# Patient Record
Sex: Male | Born: 1945
Health system: Southern US, Community
[De-identification: ages and names within clinical notes are randomized; demographics above are authoritative.]

## PROBLEM LIST (undated history)

## (undated) DIAGNOSIS — F329 Major depressive disorder, single episode, unspecified: Secondary | ICD-10-CM

## (undated) DIAGNOSIS — N529 Male erectile dysfunction, unspecified: Secondary | ICD-10-CM

## (undated) DIAGNOSIS — I252 Old myocardial infarction: Secondary | ICD-10-CM

## (undated) DIAGNOSIS — F32A Depression, unspecified: Secondary | ICD-10-CM

## (undated) DIAGNOSIS — E785 Hyperlipidemia, unspecified: Secondary | ICD-10-CM

## (undated) DIAGNOSIS — I10 Essential (primary) hypertension: Secondary | ICD-10-CM

## (undated) DIAGNOSIS — N481 Balanitis: Secondary | ICD-10-CM

## (undated) DIAGNOSIS — C61 Malignant neoplasm of prostate: Secondary | ICD-10-CM

## (undated) DIAGNOSIS — I2510 Atherosclerotic heart disease of native coronary artery without angina pectoris: Secondary | ICD-10-CM

## (undated) DIAGNOSIS — E119 Type 2 diabetes mellitus without complications: Secondary | ICD-10-CM

## (undated) HISTORY — DX: Essential (primary) hypertension: I10

## (undated) HISTORY — DX: Hyperlipidemia, unspecified: E78.5

## (undated) HISTORY — DX: Atherosclerotic heart disease of native coronary artery without angina pectoris: I25.10

## (undated) HISTORY — DX: Male erectile dysfunction, unspecified: N52.9

## (undated) HISTORY — DX: Old myocardial infarction: I25.2

## (undated) HISTORY — PX: PROSTATE BIOPSY: SHX241

## (undated) HISTORY — DX: Depression, unspecified: F32.A

## (undated) HISTORY — DX: Type 2 diabetes mellitus without complications: E11.9

## (undated) HISTORY — PX: UMBILICAL HERNIA REPAIR: SHX196

## (undated) HISTORY — DX: Balanitis: N48.1

## (undated) HISTORY — DX: Major depressive disorder, single episode, unspecified: F32.9

---

## 2003-07-18 ENCOUNTER — Inpatient Hospital Stay (HOSPITAL_BASED_OUTPATIENT_CLINIC_OR_DEPARTMENT_OTHER): Admission: RE | Admit: 2003-07-18 | Discharge: 2003-07-18 | Payer: Self-pay | Admitting: *Deleted

## 2004-09-24 ENCOUNTER — Ambulatory Visit: Payer: Self-pay | Admitting: Cardiology

## 2004-09-25 ENCOUNTER — Ambulatory Visit: Payer: Self-pay | Admitting: Internal Medicine

## 2004-10-21 ENCOUNTER — Ambulatory Visit: Payer: Self-pay | Admitting: Cardiology

## 2005-01-06 ENCOUNTER — Ambulatory Visit: Payer: Self-pay | Admitting: Internal Medicine

## 2005-01-21 ENCOUNTER — Ambulatory Visit: Payer: Self-pay | Admitting: Internal Medicine

## 2005-02-24 ENCOUNTER — Ambulatory Visit: Payer: Self-pay | Admitting: Internal Medicine

## 2005-06-24 ENCOUNTER — Ambulatory Visit: Payer: Self-pay | Admitting: Internal Medicine

## 2005-08-19 ENCOUNTER — Ambulatory Visit: Payer: Self-pay | Admitting: Cardiology

## 2005-08-26 ENCOUNTER — Encounter: Payer: Self-pay | Admitting: Cardiovascular Disease

## 2005-08-26 ENCOUNTER — Ambulatory Visit: Payer: Self-pay | Admitting: Cardiology

## 2005-08-26 ENCOUNTER — Ambulatory Visit: Payer: Self-pay

## 2006-01-18 ENCOUNTER — Ambulatory Visit: Payer: Self-pay | Admitting: Internal Medicine

## 2006-08-19 ENCOUNTER — Ambulatory Visit: Payer: Self-pay | Admitting: Cardiology

## 2006-09-30 ENCOUNTER — Encounter: Payer: Self-pay | Admitting: Internal Medicine

## 2006-09-30 DIAGNOSIS — I498 Other specified cardiac arrhythmias: Secondary | ICD-10-CM

## 2006-09-30 DIAGNOSIS — Z9889 Other specified postprocedural states: Secondary | ICD-10-CM

## 2006-09-30 DIAGNOSIS — I10 Essential (primary) hypertension: Secondary | ICD-10-CM

## 2006-09-30 DIAGNOSIS — Z951 Presence of aortocoronary bypass graft: Secondary | ICD-10-CM | POA: Insufficient documentation

## 2007-02-17 ENCOUNTER — Encounter: Payer: Self-pay | Admitting: Internal Medicine

## 2007-10-18 ENCOUNTER — Ambulatory Visit: Payer: Self-pay | Admitting: Cardiology

## 2007-10-25 ENCOUNTER — Encounter: Payer: Self-pay | Admitting: Internal Medicine

## 2007-11-09 ENCOUNTER — Ambulatory Visit: Payer: Self-pay

## 2007-12-06 ENCOUNTER — Telehealth: Payer: Self-pay | Admitting: Internal Medicine

## 2008-02-02 ENCOUNTER — Telehealth: Payer: Self-pay | Admitting: Internal Medicine

## 2008-02-22 ENCOUNTER — Ambulatory Visit: Payer: Self-pay | Admitting: Internal Medicine

## 2008-02-22 DIAGNOSIS — N476 Balanoposthitis: Secondary | ICD-10-CM

## 2008-02-22 DIAGNOSIS — K5900 Constipation, unspecified: Secondary | ICD-10-CM | POA: Insufficient documentation

## 2008-09-07 ENCOUNTER — Encounter (INDEPENDENT_AMBULATORY_CARE_PROVIDER_SITE_OTHER): Payer: Self-pay | Admitting: *Deleted

## 2009-02-11 ENCOUNTER — Telehealth: Payer: Self-pay | Admitting: Internal Medicine

## 2009-03-11 ENCOUNTER — Ambulatory Visit: Payer: Self-pay | Admitting: Internal Medicine

## 2009-03-13 ENCOUNTER — Encounter (INDEPENDENT_AMBULATORY_CARE_PROVIDER_SITE_OTHER): Payer: Self-pay | Admitting: *Deleted

## 2009-03-27 ENCOUNTER — Telehealth: Payer: Self-pay | Admitting: Internal Medicine

## 2009-04-30 ENCOUNTER — Encounter: Payer: Self-pay | Admitting: Internal Medicine

## 2009-11-13 ENCOUNTER — Ambulatory Visit: Payer: Self-pay | Admitting: Internal Medicine

## 2009-11-15 LAB — CONVERTED CEMR LAB
Albumin: 4.4 g/dL (ref 3.5–5.2)
Alkaline Phosphatase: 72 units/L (ref 39–117)
BUN: 21 mg/dL (ref 6–23)
CO2: 27 meq/L (ref 19–32)
Calcium: 10 mg/dL (ref 8.4–10.5)
Cholesterol: 158 mg/dL (ref 0–200)
Direct LDL: 78 mg/dL
GFR calc non Af Amer: 95.04 mL/min (ref >60)
Glucose, Bld: 296 mg/dL — ABNORMAL HIGH (ref 70–99)
HDL: 43.8 mg/dL (ref >39.00)
Sodium: 136 meq/L (ref 135–145)
Total Protein: 7.3 g/dL (ref 6.0–8.3)

## 2010-01-29 ENCOUNTER — Encounter: Payer: Self-pay | Admitting: Internal Medicine

## 2010-03-11 NOTE — Assessment & Plan Note (Signed)
Summary: PER PT FU--D/T---STC   Vital Signs:  Patient profile:   65 year old male Height:      72 inches Weight:      247 pounds BMI:     33.62 Temp:     98.3 degrees F oral Pulse rate:   103 / minute Pulse rhythm:   regular BP sitting:   130 / 86  (left arm) Cuff size:   regular  Vitals Entered By: Lanier Prude, CMA(AAMA) (November 13, 2009 10:38 AM)  CC: f/u Is Patient Diabetic? Yes   CC:  f/u.  History of Present Illness: The patient presents for a follow up of hypertension, diabetes, hyperlipidemia   Current Medications (verified): 1)  Metoprolol Succinate 50 Mg  Tb24 (Metoprolol Succinate) .... Once Daily 2)  Lotrisone 1-0.05 % Crea (Clotrimazole-Betamethasone) .... Use Two Times A Day X 1 Mo 3)  Lovastatin 20 Mg Tabs (Lovastatin) .... Take 2 Tab By Mouth Daily 4)  Glimepiride 4 Mg Tabs (Glimepiride) .[name].. 1 By Mouth Qd 5)  Metformin Hcl 500 Mg Tabs (Metformin Hcl) .... Take 1 Tab Twice Daily 6)  Vitamin D3 1000 Unit  Tabs (Cholecalciferol) .[name].. 1 By Mouth Daily 7)  Fibercon 625 Mg Tabs (Calcium Polycarbophil) .... 2 By Mouth Qd 8)  Aspirin 325 Mg Tabs (Aspirin) .... Once Daily  Allergies (verified): No Known Drug Allergies  Past History:  Past Medical History: Last updated: 03/11/2009 Coronary artery disease Diabetes mellitus, type II Hypertension ED Balanitis Hyperlipidemia  Family History: Last updated: 02/22/2008 DM  Past Surgical History: Denies surgical history  Social History: Occupation: truck Hospital doctor Married Never Smoked  Review of Systems       The patient complains of weight gain.  The patient denies chest pain, dyspnea on exertion, and abdominal pain.    Physical Exam  General:  NAD overweight-appearing.   Nose:  External nasal examination shows no deformity or inflammation. Nasal mucosa are pink and moist without lesions or exudates. Mouth:  Oral mucosa and oropharynx without lesions or exudates.  Teeth in good repair. Lungs:   CTA Heart:  RRR Abdomen:  Bowel sounds positive,abdomen soft and non-tender without masses, organomegaly or hernias noted. Msk:  Stiff LS Extremities:  No edema Neurologic:  alert & oriented X3.   Skin:  No rash Psych:  Cognition and judgment appear intact. Alert and cooperative with normal attention span and concentration. No apparent delusions, illusions, hallucinations   Impression & Recommendations:  Problem # 1:  BALANITIS (ICD-607.1) Assessment Unchanged Refused Urol f/u  Problem # 2:  DIABETES MELLITUS, TYPE II (ICD-250.00) Assessment: Deteriorated  The following medications were removed from the medication list:    Metformin Hcl 500 Mg Tabs (Metformin hcl) .[name]... Take 1 tab twice daily    Metformin Hcl 1000 Mg Tabs (Metformin hcl) .[name]... 1 by mouth two times a day for diabetes His updated medication list for this problem includes:    Glimepiride 4 Mg Tabs (Glimepiride) .[name]... 1 by mouth qd    Aspirin 325 Mg Tabs (Aspirin) ..... Once daily    Kombiglyze Xr 2.06-998 Mg Xr24h-tab (Saxagliptin-metformin) .[name]... 1 by mouth bid  Orders: TLB-BMP (Basic Metabolic Panel-BMET) (80048-METABOL) TLB-Hepatic/Liver Function Pnl (80076-HEPATIC) TLB-Lipid Panel (80061-LIPID) TLB-A1C / Hgb A1C (Glycohemoglobin) (83036-A1C)  Problem # 3:  CORONARY ARTERY DISEASE (ICD-414.00) Assessment: Unchanged  His updated medication list for this problem includes:    Metoprolol Succinate 50 Mg Tb24 (Metoprolol succinate) ..... Once daily    Aspirin 325 Mg Tabs (Aspirin) ..... Once  daily  Orders: TLB-BMP (Basic Metabolic Panel-BMET) (80048-METABOL) TLB-Hepatic/Liver Function Pnl (80076-HEPATIC) TLB-Lipid Panel (80061-LIPID) TLB-A1C / Hgb A1C (Glycohemoglobin) (83036-A1C)  Problem # 4:  DYSLIPIDEMIA (ICD-272.4) Assessment: Unchanged  His updated medication list for this problem includes:    Lovastatin 20 Mg Tabs (Lovastatin) .[name]... Take 2 tab by mouth daily  Orders: TLB-BMP (Basic Metabolic  Panel-BMET) (80048-METABOL)  Complete Medication List: 1)  Metoprolol Succinate 50 Mg Tb24 (Metoprolol succinate) .... Once daily 2)  Lotrisone 1-0.05 % Crea (Clotrimazole-betamethasone) .... Use two times a day x 1 mo 3)  Lovastatin 20 Mg Tabs (Lovastatin) .... Take 2 tab by mouth daily 4)  Glimepiride 4 Mg Tabs (Glimepiride) .[name].. 1 by mouth qd 5)  Vitamin D3 1000 Unit Tabs (Cholecalciferol) .[name].. 1 by mouth daily 6)  Fibercon 625 Mg Tabs (Calcium polycarbophil) .... 2 by mouth qd 7)  Aspirin 325 Mg Tabs (Aspirin) .... Once daily 8)  Kombiglyze Xr 2.06-998 Mg Xr24h-tab (Saxagliptin-metformin) .[name].. 1 by mouth bid  Patient Instructions: 1)  Please schedule a follow-up appointment in 4 months well w/labs and A1c 250.00. Prescriptions: KOMBIGLYZE XR 2.06-998 MG XR24H-TAB (SAXAGLIPTIN-METFORMIN) 1 by mouth bid  #60 x 11   Entered and Authorized by:   Tresa Garter MD   Signed by:   Tresa Garter MD on 11/15/2009   Method used:   Print then Give to Patient   RxID:   [id number] METFORMIN HCL 1000 MG TABS (METFORMIN HCL) 1 by mouth two times a day for diabetes  #180 x 3   Entered and Authorized by:   Tresa Garter MD   Signed by:   Tresa Garter MD on 11/13/2009   Method used:   Print then Give to Patient   RxID:   [id number] GLIMEPIRIDE 4 MG TABS (GLIMEPIRIDE) 1 by mouth qd  #90 x 3   Entered and Authorized by:   Tresa Garter MD   Signed by:   Tresa Garter MD on 11/13/2009   Method used:   Print then Give to Patient   RxID:   [id number] LOTRISONE 1-0.05 % CREA (CLOTRIMAZOLE-BETAMETHASONE) use two times a day x 1 mo  #90 g x 1   Entered and Authorized by:   Tresa Garter MD   Signed by:   Tresa Garter MD on 11/13/2009   Method used:   Print then Give to Patient   RxID:   [id number] LOVASTATIN 20 MG TABS (LOVASTATIN) Take 2 tab by mouth daily  #180 x 3   Entered and Authorized by:   Tresa Garter MD   Signed  by:   Tresa Garter MD on 11/13/2009   Method used:   Print then Give to Patient   RxID:   [id number] METOPROLOL SUCCINATE 50 MG  TB24 (METOPROLOL SUCCINATE) once daily  #90 x 3   Entered and Authorized by:   Tresa Garter MD   Signed by:   Tresa Garter MD on 11/13/2009   Method used:   Print then Give to Patient   RxID:   [id number] GLIMEPIRIDE 4 MG TABS (GLIMEPIRIDE) 1 by mouth qd  #30 Each x 12   Entered and Authorized by:   Tresa Garter MD   Signed by:   Tresa Garter MD on 11/13/2009   Method used:   Print then Give to Patient   RxID:   [id number] LOTRISONE 1-0.05 % CREA (CLOTRIMAZOLE-BETAMETHASONE) use two times a day x 1 mo  #30 Gram  x 3   Entered and Authorized by:   Tresa Garter MD   Signed by:   Tresa Garter MD on 11/13/2009   Method used:   Print then Give to Patient   RxID:   [id number] METOPROLOL SUCCINATE 50 MG  TB24 (METOPROLOL SUCCINATE) once daily  #30 x 12   Entered and Authorized by:   Tresa Garter MD   Signed by:   Tresa Garter MD on 11/13/2009   Method used:   Print then Give to Patient   RxID:   [id number] LOVASTATIN 20 MG TABS (LOVASTATIN) Take 2 tab by mouth daily  #60 x 12   Entered and Authorized by:   Tresa Garter MD   Signed by:   Tresa Garter MD on 11/13/2009   Method used:   Print then Give to Patient   RxID:   [id number] METFORMIN HCL 1000 MG TABS (METFORMIN HCL) 1 by mouth two times a day for diabetes  #60 x 12   Entered and Authorized by:   Tresa Garter MD   Signed by:   Tresa Garter MD on 11/13/2009   Method used:   Print then Give to Patient   RxID:   [id number]    Not Administered:    Influenza Vaccine not given due to: declined

## 2010-03-11 NOTE — Progress Notes (Signed)
Summary: lovastatin, metoprolol and metformin  Phone Note Other Incoming Call back at fax   Caller: [facility] [phone] Summary of Call: request refill on lovastatin  ok x 12 refills/vg Initial call taken by: Tora Perches,  March 27, 2009 8:39 AM    Prescriptions: METFORMIN HCL 500 MG TABS (METFORMIN HCL) Take 1 tab twice daily  #60 x 12   Entered by:   Tora Perches   Authorized by:   Tresa Garter MD   Signed by:   Tora Perches on 03/27/2009   Method used:   Faxed to ...       [facility]  [address] # [phone]* (retail)       [phone] [address]       [place], [place]  [address]       Ph: [phone]       Fax: [phone]   RxID:   [id number] METOPROLOL SUCCINATE 50 MG  TB24 (METOPROLOL SUCCINATE) once daily  #30 x 12   Entered by:   Tora Perches   Authorized by:   Tresa Garter MD   Signed by:   Tora Perches on 03/27/2009   Method used:   Faxed to ...       [facility]  [address] # [phone]* (retail)       [phone] [address]       [place], [place]  [address]       Ph: [phone]       Fax: [phone]   RxID:   [id number] LOVASTATIN 20 MG TABS (LOVASTATIN) Take 2 tab by mouth daily  #60 x 12   Entered by:   Tora Perches   Authorized by:   Tresa Garter MD   Signed by:   Tora Perches on 03/27/2009   Method used:   Faxed to ...       [facility]  [address] # [phone]* (retail)       [phone] [address]       [place], [place]  [address]       Ph: [phone]       Fax: [phone]   RxID:   [id number]

## 2010-03-11 NOTE — Letter (Signed)
Summary: [facility] Consult Scheduled Letter  [facility]  [address] [place], [place] [address]   Phone: [phone]  Fax: [phone]      03/13/2009 MRN: [id number]  [facility] [place] [address] [place], [place]  [address]    Dear Mr. Mackley,      We have scheduled an appointment for you.  At the recommendation of Dr.Plotnikov ,we have scheduled you a consult with Dr.Ottelin on Febuary 8,2011 at 3:00pm.Their phone number is [phone].If this appointment day and time is not convenient for you, please feel free to call the office of the doctor you are being referred to at the number listed above and reschedule the appointment.  [facility] [address] [facility] [place], [place] [address].  [phone]    Thank you,  Patient Care Coordinator [facility]

## 2010-03-11 NOTE — Assessment & Plan Note (Signed)
Summary: FU Clinton Kirby  #   Vital Signs:  Patient profile:   65 year old male Height:      72 inches Weight:      248 pounds BMI:     33.76 Temp:     97.9 degrees F oral Pulse rate:   85 / minute BP sitting:   128 / 84  (left arm)  Vitals Entered By: Tora Perches (March 11, 2009 2:02 PM) CC: f/u Is Patient Diabetic? Yes   CC:  f/u.  History of Present Illness: The patient presents for a follow up of hypertension, diabetes, hyperlipidemia and balanitis - chronic.  Preventive Screening-Counseling & Management  Alcohol-Tobacco     Smoking Status: never  Current Medications (verified): 1)  Metoprolol Succinate 50 Mg  Tb24 (Metoprolol Succinate) .... Once Daily 2)  Lotrisone 1-0.05 % Crea (Clotrimazole-Betamethasone) .... Use Two Times A Day X 1 Mo 3)  Lovastatin 20 Mg Tabs (Lovastatin) .... Take 2 Tab By Mouth Daily 4)  Glimepiride 4 Mg Tabs (Glimepiride) .[name].. 1 By Mouth Qd 5)  Metformin Hcl 500 Mg Tabs (Metformin Hcl) .... Take 1 Tab Twice Daily 6)  Vitamin D3 1000 Unit  Tabs (Cholecalciferol) .[name].. 1 By Mouth Daily 7)  Fibercon 625 Mg Tabs (Calcium Polycarbophil) .... 2 By Mouth Qd 8)  Aspirin 325 Mg Tabs (Aspirin) .... Once Daily  Allergies (verified): No Known Drug Allergies  Past History:  Family History: Last updated: 02/22/2008 DM  Social History: Last updated: 02/22/2008 Occupation: Married Never Smoked  Past Medical History: Coronary artery disease Diabetes mellitus, type II Hypertension ED Balanitis Hyperlipidemia  Review of Systems       The patient complains of weight gain.  The patient denies fever, decreased hearing, dyspnea on exertion, abdominal pain, and difficulty walking.    Physical Exam  General:  NAD overweight-appearing.   Nose:  External nasal examination shows no deformity or inflammation. Nasal mucosa are pink and moist without lesions or exudates. Mouth:  Oral mucosa and oropharynx without lesions or exudates.  Teeth in good  repair. Lungs:  CTA Heart:  RRR Abdomen:  Bowel sounds positive,abdomen soft and non-tender without masses, organomegaly or hernias noted. Genitalia:  dry thickened generous foreskin Msk:  WNL Extremities:  No edema Neurologic:  alert & oriented X3.   Skin:  No rash Psych:  Cognition and judgment appear intact. Alert and cooperative with normal attention span and concentration. No apparent delusions, illusions, hallucinations   Impression & Recommendations:  Problem # 1:  HYPERLIPIDEMIA (ICD-272.4) Assessment Comment Only  His updated medication list for this problem includes:    Lovastatin 20 Mg Tabs (Lovastatin) .[name]... Take 2 tab by mouth daily  Problem # 2:  BALANITIS (ICD-607.1) chronic and refractory Assessment: Deteriorated  Surgical consult for possible. circumcision if appropriate  Orders: Urology Referral (Urology)  Problem # 3:  DIABETES MELLITUS, TYPE II (ICD-250.00) Assessment: Comment Only  His updated medication list for this problem includes:    Glimepiride 4 Mg Tabs (Glimepiride) .[name]... 1 by mouth qd    Metformin Hcl 500 Mg Tabs (Metformin hcl) .[name]... Take 1 tab twice daily    Aspirin 325 Mg Tabs (Aspirin) ..... Once daily  Problem # 4:  HYPERTENSION (ICD-401.9) Assessment: Unchanged  His updated medication list for this problem includes:    Metoprolol Succinate 50 Mg Tb24 (Metoprolol succinate) ..... Once daily  Complete Medication List: 1)  Metoprolol Succinate 50 Mg Tb24 (Metoprolol succinate) .... Once daily 2)  Lotrisone 1-0.05 % Crea (Clotrimazole-betamethasone) .Marland KitchenMarland KitchenMarland Kitchen  Use two times a day x 1 mo 3)  Lovastatin 20 Mg Tabs (Lovastatin) .... Take 2 tab by mouth daily 4)  Glimepiride 4 Mg Tabs (Glimepiride) .[name].. 1 by mouth qd 5)  Metformin Hcl 500 Mg Tabs (Metformin hcl) .... Take 1 tab twice daily 6)  Vitamin D3 1000 Unit Tabs (Cholecalciferol) .[name].. 1 by mouth daily 7)  Fibercon 625 Mg Tabs (Calcium polycarbophil) .... 2 by mouth qd 8)  Aspirin 325 Mg  Tabs (Aspirin) .... Once daily  Patient Instructions: 1)  Please schedule a follow-up appointment in 3 months. 2)  BMP prior to visit, ICD-9: 3)  Hepatic Panel prior to visit, ICD-9: 4)  Lipid Panel prior to visit, ICD-9:v70.0  250.00 5)  TSH prior to visit, ICD-9: 6)  CBC w/ Diff prior to visit, ICD-9: 7)  Urine-dip prior to visit, ICD-9: 8)  PSA prior to visit, ICD-9: 9)  HbgA1C prior to visit, ICD-9: 10)  Vit B12 782.0

## 2010-03-11 NOTE — Progress Notes (Signed)
Summary: refill  Phone Note Refill Request Message from:  Fax from Pharmacy on February 11, 2009 12:07 PM  Refills Requested: Medication #1:  GLIMEPIRIDE 4 MG TABS 1 by mouth qd   Supply Requested: 1 month Initial call taken by: Rock Nephew CMA,  February 11, 2009 12:08 PM    Prescriptions: GLIMEPIRIDE 4 MG TABS (GLIMEPIRIDE) 1 by mouth qd  #30 Each x 11   Entered by:   Rock Nephew CMA   Authorized by:   Tresa Garter MD   Signed by:   Rock Nephew CMA on 02/11/2009   Method used:   Electronically to        [facility] [address] # [phone]* (retail)       [address]       [place], [place]  [address]       Ph: [phone]       Fax: [phone]   RxID:   [id number]

## 2010-03-11 NOTE — Consult Note (Signed)
Summary: [facility]   Imported By: Sherian Rein 05/16/2009 09:56:26  _____________________________________________________________________  External Attachment:    Type:   Image     Comment:   External Document

## 2010-03-13 NOTE — Letter (Signed)
Summary: Response/[employer]  Response/[employer]   Imported By: Lester Highlands 02/04/2010 09:46:51  _____________________________________________________________________  External Attachment:    Type:   Image     Comment:   External Document

## 2010-03-19 ENCOUNTER — Other Ambulatory Visit: Payer: Self-pay

## 2010-04-01 ENCOUNTER — Encounter: Payer: Self-pay | Admitting: Internal Medicine

## 2010-06-24 NOTE — Assessment & Plan Note (Signed)
[facility]                            CARDIOLOGY OFFICE NOTE   Clinton Kirby, Clinton Kirby                   MRN:          [id number]  DATE:10/18/2007                            DOB:          1945/08/06    HISTORY OF PRESENT ILLNESS:  Clinton Kirby returns today for followup.   PROBLEM LIST:  Coronary artery disease status post inferior wall infarct  and status post bare-metal stent in 2004.  Last stress Myoview was on  August 26, 2005, EF 56% with small inferior basal wall scar, mild peri-  infarct ischemia.  We have been treating him medically.   He is still driving long distances.  He lives in [place].  He has had  no angina or ischemic symptoms.   His biggest complaint today is that of constipation.  He has had  diabetes for about 5 years, which may be part of it.  He denies any  melena or hematochezia.  He does have some hemorrhoidal problems,  however.   He drinks plenty of water, eats lot of fruit, and tries to eat fiber-  related foods.  I told him, he might be overdoing it.   MEDICATIONS:  His meds are unchanged since last year.  He is on  metoprolol 50 mg a day, glyburide, metformin 2.5/500 one b.i.d.,  lovastatin 40 mg a day, enteric-coated aspirin 325 a day.   PHYSICAL EXAMINATION:  VITAL SIGNS:  Today his blood pressure 136/84.  Pulse is 80 and regular.  Weight is 253 down 20!  HEENT:  Normal.  NECK:  Carotids upstrokes are equal bilaterally without bruits, no JVD.  Thyroid is not enlarged.  Trachea is midline.  Supple.  LUNGS:  Clear to auscultation and percussion.  HEART:  Soft S1 and S2.  PMI was difficult to appreciate.  ABDOMEN:  Soft, good bowel sounds.  No midline bruit.  No hepatomegaly.  No pulsatile mass.  EXTREMITIES:  No cyanosis, clubbing, or edema.  Pulses are intact.  NEURO:  Intact.  SKIN:  Unremarkable.   Electrocardiogram shows sinus rhythm with old inferior wall infarct.  No  change.   ASSESSMENT AND PLAN:   Clinton Kirby is doing well from our standpoint.  I  have recommended the following;  1. 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  He will do      this off of metoprolol on a treadmill.  2. Prune juice/ apple juice.  I have also recommended Metamucil.  He      will continue to drink a lot of fluid.   Assuming he had a stress test, he is stable.  I will see him back in a  year.     Thomas C. Daleen Squibb, MD, [facility]  Electronically Signed    TCW/MedQ  DD: 10/18/2007  DT: 10/19/2007  Job #: [id number]

## 2010-06-24 NOTE — Assessment & Plan Note (Signed)
[facility]                            CARDIOLOGY OFFICE NOTE   Clinton Kirby, Clinton Kirby                   MRN:          [id number]  DATE:08/19/2006                            DOB:          10/13/1945    [name] returns today for further management of his coronary artery  disease.  He is status post inferior wall infarct, status post bare  metal stent in 2004.   He is having no angina or ischemic symptoms.  His last stress Myoview  was August 26, 2005; EF was 56%, small inferior basal wall infarct with  mild peri-infarct ischemia.  He has been treated medically.   He is seeing Dr. Posey Rea tomorrow for his physical.  He will get blood  work there.   He is no longer taking enteric coated aspirin, but only Plavix alone.  He said Dr. Posey Rea had put him on this because he did not want him to  be on both for fear of bleeding.  He is a Agricultural consultant.   OTHER MEDICATIONS:  1. Metoprolol 50 mg daily.  2. Glyburide/metformin, 2.5/500 one p.o. b.i.d.  3. Lovastatin 40 mg daily.   EXAM:  His blood pressure is 129/90, his pulse is 96 and regular.  HEENT:  Normocephalic, atraumatic.  PERRLA.  Extraocular movements  intact.  Sclerae are clear.  Facial symmetry is normal.  Carotid  upstrokes were equal bilaterally, without bruits.  There is no JVD.  Thyroid is not enlarged.  Trachea is midline.  LUNGS:  Clear.  HEART:  Reveals a soft S1, S2.  PMI poorly appreciated.  ABDOMINAL EXAM:  Soft, good bowel sounds.  EXTREMITIES:  No edema.  Pulses are intact.  NEUROLOGIC EXAM:  Intact.   Electrocardiogram shows an old inferior wall infarct, no other changes.   I had a long talk with [name] today.  Because he is now over four  years out from having his inferior wall infarct and drug eluting stent,  I will stop his Plavix.  It is costing him $40 to $45 a month.  Diesel  fuel is also costing him $1000 a week!   We will put him back on  aspirin 325 mg a day.  He will continue his  other medications.  He will obtain blood work with Dr. Posey Rea.   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     Maisie Fus C. Daleen Squibb, MD, [facility]  Electronically Signed    TCW/MedQ  DD: 08/19/2006  DT: 08/20/2006  Job #: [id number]   cc:   Georgina Quint. Plotnikov, MD

## 2010-06-27 NOTE — Assessment & Plan Note (Signed)
[facility]                              CARDIOLOGY OFFICE NOTE   KAHLEL, PEAKE                   MRN:          [id number]  DATE:08/19/2005                            DOB:          Jan 17, 1946    [name] returns today for further management of his coronary artery  disease. Please see my note from September 24, 2004.   He is having no symptoms of angina or ischemia. 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   MEDICATIONS:  1.  Plavix 75 mg daily.  2.  Metoprolol 50 b.i.d.  3.  Aspirin 325 daily.  4.  Glucophage 500 mg daily.  5.  Lipitor 5 mg daily.   PHYSICAL EXAMINATION:  VITAL SIGNS:  Blood pressure is 130/86, pulse 81 and  regular. His weight is 273.  NECK:  Carotids are full, no JVD. Thyroid is not enlarged. Trachea is  midline. There are no bruits.  HEART:  Reveals a regular rate and rhythm, soft S1, S2.  ABDOMEN:  Protuberant, good bowel sounds.  EXTREMITIES:  Reveal no edema. Pulses are present.   EKG shows an old inferior wall infarct which is stable pattern. Otherwise no  abnormalities.   ASSESSMENT AND PLAN:  [name] is doing well. Will schedule him for a  stress Myoview and fasting lipids, LFTs. Will see him back in a year if  everything is stable.   Please note that he had a catheterization after a mildly abnormal Myoview in  the past. His stent was open.  There was no other obstructive disease. He  specifically had mild peri-infarct ischemia at the inferior wall towards the  apex. We need to take this account with this upcoming Myoview.                               Clinton C. Daleen Squibb, MD, [facility]    TCW/MedQ  DD:  08/19/2005  DT:  08/19/2005  Job #:  [id number]   cc:   Clinton Primes, MD

## 2010-06-27 NOTE — Cardiovascular Report (Signed)
Clinton Kirby, Clinton Kirby                      ACCOUNT NO.:  [id number]   MEDICAL RECORD NO.:  [id number]                   PATIENT TYPE:  OIB   LOCATION:  6501                                 FACILITY:  [facility]   PHYSICIAN:  Carole Binning, M.D. [facility]         DATE OF BIRTH:  [birth year]   DATE OF PROCEDURE:  07/18/2003  DATE OF DISCHARGE:                              CARDIAC CATHETERIZATION   PROCEDURE PERFORMED:  Left heart catheterization with coronary angiography  and left ventriculography.   INDICATION:  [name] is a 65 year old male with a history of coronary  artery disease, status post inferior wall myocardial infarction in February  of 2004.  He was treated with a drug-eluting stent of the right coronary  artery.  He has been asymptomatic since then.  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  He underwent a stress  Cardiolite on June 1.  This revealed inferior infarct at the base and mid  ventricular level with mild peri-infarct ischemia in the inferior wall  towards the apex.  Because of the presence of ischemia seen on the  Cardiolite, we proceeded with a cardiac catheterization to rule out residual  coronary artery disease.   PROCEDURAL NOTE:  A 4-French sheath was placed in the right femoral artery.  Coronary angiography was performed with the standard Judkins 4-French  catheters.  Left ventriculography was performed with an angled pigtail  catheter.  Contrast was Omnipaque.  There were no complications.   RESULTS:  HEMODYNAMICS:  Left ventricular pressure 128/16, aortic pressure  128/80.  There was no aortic valve gradient.   LEFT VENTRICULOGRAM:  Wall motion is normal.  Ejection fraction estimated at  greater than 55%.  There is no mitral regurgitation.   CORONARY ARTERIOGRAPHY (CODOMINANT)  1. Left main is normal.  2. Left anterior descending artery gives rise to two small diagonal     branches.  The LAD has only minor  luminal irregularities.  3. Left circumflex is a large codominant vessel.  It gives rise to a large     first obtuse marginal, normal-size second obtuse marginal, followed by a     normal-size first posterolateral branch and small second and third     posterolateral branches.  The left circumflex is normal.  4. Right coronary artery is a small codominant vessel.  There is a 20%     stenosis in the mid vessel.  Just beyond this, there is a stent in the     mid vessel which is widely patent with 0% stenosis within the stent.  The     distal right coronary artery gives rise to a large acute marginal which     supplies the distal portion of the inferior septum.  There is a small     posterior descending artery also arising from the distal right coronary     artery.   IMPRESSION:  1. Preserved left  ventricular systolic function.  2. Patent stent in the right coronary artery.  There is no significant     residual coronary artery disease identified.   PLAN:  The patient will be continued on medical therapy.  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                                               Carole Binning, M.D. [facility]    MWP/MEDQ  D:  07/18/2003  T:  07/19/2003  Job:  [id number] [id number]

## 2010-09-04 ENCOUNTER — Other Ambulatory Visit: Payer: Self-pay | Admitting: Internal Medicine

## 2010-09-17 ENCOUNTER — Telehealth: Payer: Self-pay | Admitting: Cardiology

## 2010-09-17 NOTE — Telephone Encounter (Signed)
Pt needs order 8-29

## 2010-12-30 ENCOUNTER — Encounter: Payer: Self-pay | Admitting: Internal Medicine

## 2010-12-31 ENCOUNTER — Ambulatory Visit (INDEPENDENT_AMBULATORY_CARE_PROVIDER_SITE_OTHER): Payer: PRIVATE HEALTH INSURANCE | Admitting: Internal Medicine

## 2010-12-31 ENCOUNTER — Encounter: Payer: Self-pay | Admitting: Internal Medicine

## 2010-12-31 VITALS — BP 130/78 | HR 84 | Temp 98.9°F | Resp 16 | Ht 72.0 in | Wt 244.0 lb

## 2010-12-31 DIAGNOSIS — Z0000 Encounter for general adult medical examination without abnormal findings: Secondary | ICD-10-CM

## 2010-12-31 DIAGNOSIS — I2510 Atherosclerotic heart disease of native coronary artery without angina pectoris: Secondary | ICD-10-CM

## 2010-12-31 DIAGNOSIS — E119 Type 2 diabetes mellitus without complications: Secondary | ICD-10-CM

## 2010-12-31 DIAGNOSIS — N476 Balanoposthitis: Secondary | ICD-10-CM

## 2010-12-31 DIAGNOSIS — E785 Hyperlipidemia, unspecified: Secondary | ICD-10-CM

## 2010-12-31 DIAGNOSIS — I10 Essential (primary) hypertension: Secondary | ICD-10-CM

## 2010-12-31 MED ORDER — LOVASTATIN 20 MG PO TABS: 40.0000 mg | ORAL_TABLET | Freq: Every day | ORAL | Status: DC

## 2010-12-31 MED ORDER — METOPROLOL SUCCINATE ER 50 MG PO TB24: 50.0000 mg | ORAL_TABLET | Freq: Every day | ORAL | Status: DC

## 2010-12-31 MED ORDER — METFORMIN HCL 1000 MG PO TABS: 1000.0000 mg | ORAL_TABLET | Freq: Two times a day (BID) | ORAL | Status: DC

## 2010-12-31 MED ORDER — GLIMEPIRIDE 4 MG PO TABS: 4.0000 mg | ORAL_TABLET | Freq: Every day | ORAL | Status: DC

## 2010-12-31 NOTE — Assessment & Plan Note (Signed)
Continue with current prescription therapy as reflected on the Med list.  

## 2010-12-31 NOTE — Assessment & Plan Note (Signed)
Resolved w/cream

## 2011-01-04 DIAGNOSIS — E785 Hyperlipidemia, unspecified: Secondary | ICD-10-CM | POA: Insufficient documentation

## 2011-01-04 NOTE — Assessment & Plan Note (Signed)
On Lovastatin 

## 2011-01-04 NOTE — Progress Notes (Signed)
  Subjective:    Patient ID: Clinton Kirby, male    DOB: 1945-08-19, 65 y.o.   MRN: [id number]  HPI The patient presents for a follow-up of  chronic hypertension, chronic dyslipidemia, type 2 diabetes, CAD controlled with medicines     Review of Systems  Constitutional: Negative for appetite change, fatigue and unexpected weight change.  HENT: Negative for nosebleeds, congestion, sore throat, sneezing, trouble swallowing and neck pain.   Eyes: Negative for itching and visual disturbance.  Respiratory: Negative for cough.   Cardiovascular: Negative for chest pain, palpitations and leg swelling.  Gastrointestinal: Negative for nausea, diarrhea, blood in stool and abdominal distention.  Genitourinary: Negative for frequency and hematuria.  Musculoskeletal: Negative for back pain, joint swelling and gait problem.  Skin: Negative for rash.  Neurological: Negative for dizziness, tremors, speech difficulty and weakness.  Psychiatric/Behavioral: Negative for sleep disturbance, dysphoric mood and agitation. The patient is not nervous/anxious.        Objective:   Physical Exam  Constitutional: He is oriented to person, place, and time. He appears well-developed.       obese  HENT:  Mouth/Throat: Oropharynx is clear and moist.  Eyes: Conjunctivae are normal. Pupils are equal, round, and reactive to light.  Neck: Normal range of motion. No JVD present. No thyromegaly present.  Cardiovascular: Normal rate, regular rhythm, normal heart sounds and intact distal pulses.  Exam reveals no gallop and no friction rub.   No murmur heard. Pulmonary/Chest: Effort normal and breath sounds normal. No respiratory distress. He has no wheezes. He has no rales. He exhibits no tenderness.  Abdominal: Soft. Bowel sounds are normal. He exhibits no distension and no mass. There is no tenderness. There is no rebound and no guarding.  Musculoskeletal: Normal range of motion. He exhibits no edema and no  tenderness.  Lymphadenopathy:    He has no cervical adenopathy.  Neurological: He is alert and oriented to person, place, and time. He has normal reflexes. No cranial nerve deficit. He exhibits normal muscle tone. Coordination normal.  Skin: Skin is warm and dry. No rash noted.  Psychiatric: He has a normal mood and affect. His behavior is normal. Judgment and thought content normal.   Wt Readings from Last 3 Encounters:  12/31/10 244 lb (110.678 kg)  11/13/09 247 lb (112.038 kg)  03/11/09 248 lb (112.492 kg)          Assessment & Plan:

## 2011-04-28 ENCOUNTER — Ambulatory Visit: Payer: PRIVATE HEALTH INSURANCE | Admitting: Internal Medicine

## 2011-05-18 ENCOUNTER — Encounter: Payer: Self-pay | Admitting: Internal Medicine

## 2011-05-18 ENCOUNTER — Other Ambulatory Visit (INDEPENDENT_AMBULATORY_CARE_PROVIDER_SITE_OTHER): Payer: PRIVATE HEALTH INSURANCE

## 2011-05-18 ENCOUNTER — Ambulatory Visit (INDEPENDENT_AMBULATORY_CARE_PROVIDER_SITE_OTHER): Payer: Medicare Other | Admitting: Internal Medicine

## 2011-05-18 VITALS — BP 140/82 | HR 88 | Temp 98.8°F | Resp 16 | Wt 238.0 lb

## 2011-05-18 DIAGNOSIS — E1165 Type 2 diabetes mellitus with hyperglycemia: Secondary | ICD-10-CM

## 2011-05-18 DIAGNOSIS — R209 Unspecified disturbances of skin sensation: Secondary | ICD-10-CM

## 2011-05-18 DIAGNOSIS — R5381 Other malaise: Secondary | ICD-10-CM

## 2011-05-18 DIAGNOSIS — IMO0001 Reserved for inherently not codable concepts without codable children: Secondary | ICD-10-CM

## 2011-05-18 DIAGNOSIS — R5383 Other fatigue: Secondary | ICD-10-CM

## 2011-05-18 DIAGNOSIS — E1149 Type 2 diabetes mellitus with other diabetic neurological complication: Secondary | ICD-10-CM | POA: Insufficient documentation

## 2011-05-18 DIAGNOSIS — I2510 Atherosclerotic heart disease of native coronary artery without angina pectoris: Secondary | ICD-10-CM

## 2011-05-18 DIAGNOSIS — R202 Paresthesia of skin: Secondary | ICD-10-CM

## 2011-05-18 DIAGNOSIS — E785 Hyperlipidemia, unspecified: Secondary | ICD-10-CM

## 2011-05-18 DIAGNOSIS — I10 Essential (primary) hypertension: Secondary | ICD-10-CM

## 2011-05-18 LAB — BASIC METABOLIC PANEL
BUN: 23 mg/dL (ref 6–23)
Chloride: 102 mEq/L (ref 96–112)
Creatinine, Ser: 0.9 mg/dL (ref 0.4–1.5)
GFR: 107.9 mL/min (ref >60.00)
Potassium: 4.4 mEq/L (ref 3.5–5.1)

## 2011-05-18 LAB — HEPATIC FUNCTION PANEL
AST: 17 U/L (ref 0–37)
Albumin: 4.4 g/dL (ref 3.5–5.2)
Alkaline Phosphatase: 75 U/L (ref 39–117)
Total Protein: 7.4 g/dL (ref 6.0–8.3)

## 2011-05-18 LAB — LIPID PANEL
Cholesterol: 212 mg/dL — ABNORMAL HIGH (ref 0–200)
Triglycerides: 428 mg/dL — ABNORMAL HIGH (ref 0.0–149.0)

## 2011-05-18 LAB — TSH: TSH: 1.03 u[IU]/mL (ref 0.35–5.50)

## 2011-05-18 MED ORDER — SAXAGLIPTIN-METFORMIN ER 2.5-1000 MG PO TB24: 1.0000 | ORAL_TABLET | Freq: Two times a day (BID) | ORAL | Status: DC

## 2011-05-18 NOTE — Assessment & Plan Note (Addendum)
  Endocr cons Dr Everardo All Complicated control issues. Not sure how he takes his meds. 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 He was taking Kombiglize -- too $$$ Risks associated with treatment noncompliance were discussed. Compliance was encouraged.

## 2011-05-18 NOTE — Assessment & Plan Note (Signed)
4/13 Multifactorial, i.e. Working 70h/wk CBGs need to be better

## 2011-05-18 NOTE — Assessment & Plan Note (Signed)
Continue with current prescription therapy as reflected on the Med list.  

## 2011-05-18 NOTE — Progress Notes (Signed)
Patient ID: Clinton Kirby, male   DOB: 06-08-1945, 66 y.o.   MRN: [id number]  Subjective:    Patient ID: Clinton Kirby, male    DOB: June 01, 1945, 66 y.o.   MRN: [id number]  HPI The patient presents for a follow-up of  chronic hypertension, chronic dyslipidemia, type 2 diabetes, CAD controlled with medicines  BP Readings from Last 3 Encounters:  05/18/11 140/82  12/31/10 130/78  11/13/09 130/86   Wt Readings from Last 3 Encounters:  05/18/11 238 lb (107.956 kg)  12/31/10 244 lb (110.678 kg)  11/13/09 247 lb (112.038 kg)       Review of Systems  Constitutional: Positive for fatigue. Negative for appetite change and unexpected weight change.  HENT: Negative for nosebleeds, congestion, sore throat, sneezing, trouble swallowing and neck pain.   Eyes: Negative for itching and visual disturbance.  Respiratory: Negative for cough.   Cardiovascular: Negative for chest pain, palpitations and leg swelling.  Gastrointestinal: Negative for nausea, diarrhea, blood in stool and abdominal distention.  Genitourinary: Negative for frequency and hematuria.  Musculoskeletal: Negative for back pain, joint swelling and gait problem.  Skin: Negative for rash.  Neurological: Negative for dizziness, tremors, speech difficulty and weakness.  Psychiatric/Behavioral: Negative for sleep disturbance, dysphoric mood and agitation. The patient is not nervous/anxious.        Objective:   Physical Exam  Constitutional: He is oriented to person, place, and time. He appears well-developed.       obese  HENT:  Mouth/Throat: Oropharynx is clear and moist.  Eyes: Conjunctivae are normal. Pupils are equal, round, and reactive to light.  Neck: Normal range of motion. No JVD present. No thyromegaly present.  Cardiovascular: Normal rate, regular rhythm, normal heart sounds and intact distal pulses.  Exam reveals no gallop and no friction rub.   No murmur heard. Pulmonary/Chest: Effort normal and breath  sounds normal. No respiratory distress. He has no wheezes. He has no rales. He exhibits no tenderness.  Abdominal: Soft. Bowel sounds are normal. He exhibits no distension and no mass. There is no tenderness. There is no rebound and no guarding.  Musculoskeletal: Normal range of motion. He exhibits no edema and no tenderness.  Lymphadenopathy:    He has no cervical adenopathy.  Neurological: He is alert and oriented to person, place, and time. He has normal reflexes. No cranial nerve deficit. He exhibits normal muscle tone. Coordination normal.  Skin: Skin is warm and dry. No rash noted.  Psychiatric: He has a normal mood and affect. His behavior is normal. Judgment and thought content normal.   Lab Results  Component Value Date   GLUCOSE 296* 11/13/2009   CHOL 158 11/13/2009   TRIG 299.0* 11/13/2009   HDL 43.80 11/13/2009   LDLDIRECT 78.0 11/13/2009   ALT 27 11/13/2009   AST 18 11/13/2009   NA 136 11/13/2009   K 4.7 11/13/2009   CL 101 11/13/2009   CREATININE 1.0 11/13/2009   BUN 21 11/13/2009   CO2 27 11/13/2009   HGBA1C 12.1* 11/13/2009           Assessment & Plan:

## 2011-05-21 ENCOUNTER — Telehealth: Payer: Self-pay | Admitting: Internal Medicine

## 2011-05-21 NOTE — Telephone Encounter (Signed)
He has an appt w/Dr Everardo All sheduled Thx

## 2011-06-09 ENCOUNTER — Ambulatory Visit: Payer: Medicare Other | Admitting: Endocrinology

## 2011-06-22 ENCOUNTER — Ambulatory Visit: Payer: Medicare Other | Admitting: Endocrinology

## 2011-11-13 ENCOUNTER — Encounter: Payer: Self-pay | Admitting: Internal Medicine

## 2011-11-13 ENCOUNTER — Other Ambulatory Visit (INDEPENDENT_AMBULATORY_CARE_PROVIDER_SITE_OTHER): Payer: Medicare Other

## 2011-11-13 ENCOUNTER — Ambulatory Visit (INDEPENDENT_AMBULATORY_CARE_PROVIDER_SITE_OTHER): Payer: Medicare Other | Admitting: Internal Medicine

## 2011-11-13 VITALS — BP 140/90 | HR 96 | Temp 98.2°F | Resp 16 | Wt 242.0 lb

## 2011-11-13 DIAGNOSIS — E1165 Type 2 diabetes mellitus with hyperglycemia: Secondary | ICD-10-CM

## 2011-11-13 DIAGNOSIS — I10 Essential (primary) hypertension: Secondary | ICD-10-CM

## 2011-11-13 DIAGNOSIS — R5381 Other malaise: Secondary | ICD-10-CM

## 2011-11-13 DIAGNOSIS — R5383 Other fatigue: Secondary | ICD-10-CM

## 2011-11-13 DIAGNOSIS — E785 Hyperlipidemia, unspecified: Secondary | ICD-10-CM

## 2011-11-13 DIAGNOSIS — IMO0002 Reserved for concepts with insufficient information to code with codable children: Secondary | ICD-10-CM

## 2011-11-13 LAB — BASIC METABOLIC PANEL
BUN: 16 mg/dL (ref 6–23)
CO2: 25 mEq/L (ref 19–32)
Calcium: 10 mg/dL (ref 8.4–10.5)
Creatinine, Ser: 0.9 mg/dL (ref 0.4–1.5)
GFR: 116.55 mL/min (ref >60.00)
Glucose, Bld: 225 mg/dL — ABNORMAL HIGH (ref 70–99)

## 2011-11-13 NOTE — Progress Notes (Signed)
  Subjective:    Patient ID: Clinton Kirby, male    DOB: January 26, 1946, 66 y.o.   MRN: [id number]  HPI The patient presents for a follow-up of  chronic hypertension, chronic dyslipidemia, type 2 diabetes, CAD controlled with medicines  BP Readings from Last 3 Encounters:  11/13/11 140/90  05/18/11 140/82  12/31/10 130/78   Wt Readings from Last 3 Encounters:  11/13/11 242 lb (109.77 kg)  05/18/11 238 lb (107.956 kg)  12/31/10 244 lb (110.678 kg)       Review of Systems  Constitutional: Positive for fatigue. Negative for appetite change and unexpected weight change.  HENT: Negative for nosebleeds, congestion, sore throat, sneezing, trouble swallowing and neck pain.   Eyes: Negative for itching and visual disturbance.  Respiratory: Negative for cough.   Cardiovascular: Negative for chest pain, palpitations and leg swelling.  Gastrointestinal: Negative for nausea, diarrhea, blood in stool and abdominal distention.  Genitourinary: Negative for frequency and hematuria.  Musculoskeletal: Negative for back pain, joint swelling and gait problem.  Skin: Negative for rash.  Neurological: Negative for dizziness, tremors, speech difficulty and weakness.  Psychiatric/Behavioral: Negative for disturbed wake/sleep cycle, dysphoric mood and agitation. The patient is not nervous/anxious.        Objective:   Physical Exam  Constitutional: He is oriented to person, place, and time. He appears well-developed.       obese  HENT:  Mouth/Throat: Oropharynx is clear and moist.  Eyes: Conjunctivae normal are normal. Pupils are equal, round, and reactive to light.  Neck: Normal range of motion. No JVD present. No thyromegaly present.  Cardiovascular: Normal rate, regular rhythm, normal heart sounds and intact distal pulses.  Exam reveals no gallop and no friction rub.   No murmur heard. Pulmonary/Chest: Effort normal and breath sounds normal. No respiratory distress. He has no wheezes. He has no  rales. He exhibits no tenderness.  Abdominal: Soft. Bowel sounds are normal. He exhibits no distension and no mass. There is no tenderness. There is no rebound and no guarding.  Musculoskeletal: Normal range of motion. He exhibits no edema and no tenderness.  Lymphadenopathy:    He has no cervical adenopathy.  Neurological: He is alert and oriented to person, place, and time. He has normal reflexes. No cranial nerve deficit. He exhibits normal muscle tone. Coordination normal.  Skin: Skin is warm and dry. No rash noted.  Psychiatric: He has a normal mood and affect. His behavior is normal. Judgment and thought content normal.   Lab Results  Component Value Date   GLUCOSE 396* 05/18/2011   CHOL 212* 05/18/2011   TRIG 428.0* 05/18/2011   HDL 45.70 05/18/2011   LDLDIRECT 115.4 05/18/2011   ALT 20 05/18/2011   AST 17 05/18/2011   NA 135 05/18/2011   K 4.4 05/18/2011   CL 102 05/18/2011   CREATININE 0.9 05/18/2011   BUN 23 05/18/2011   CO2 23 05/18/2011   TSH 1.03 05/18/2011   HGBA1C 12.8* 05/18/2011           Assessment & Plan:

## 2011-11-13 NOTE — Assessment & Plan Note (Signed)
Continue with current prescription therapy as reflected on the Med list.  

## 2011-11-13 NOTE — Assessment & Plan Note (Signed)
Continue with current prescription therapy as reflected on the Med list. Risks associated with treatment noncompliance were discussed. Compliance was encouraged.  

## 2011-11-13 NOTE — Assessment & Plan Note (Signed)
Chronic  He is stating BP is nl at home Higher dose of Toprol caused fatigue Continue with current prescription therapy as reflected on the Med list.

## 2011-11-13 NOTE — Assessment & Plan Note (Signed)
Better  

## 2011-11-16 ENCOUNTER — Telehealth: Payer: Self-pay | Admitting: Internal Medicine

## 2011-11-16 ENCOUNTER — Encounter: Payer: Self-pay | Admitting: Cardiology

## 2011-11-16 ENCOUNTER — Ambulatory Visit (INDEPENDENT_AMBULATORY_CARE_PROVIDER_SITE_OTHER): Payer: Medicare Other | Admitting: Cardiology

## 2011-11-16 VITALS — BP 150/92 | HR 96 | Ht 72.0 in | Wt 242.8 lb

## 2011-11-16 DIAGNOSIS — I10 Essential (primary) hypertension: Secondary | ICD-10-CM

## 2011-11-16 DIAGNOSIS — I2510 Atherosclerotic heart disease of native coronary artery without angina pectoris: Secondary | ICD-10-CM

## 2011-11-16 DIAGNOSIS — E1165 Type 2 diabetes mellitus with hyperglycemia: Secondary | ICD-10-CM

## 2011-11-16 DIAGNOSIS — E785 Hyperlipidemia, unspecified: Secondary | ICD-10-CM

## 2011-11-16 NOTE — Telephone Encounter (Signed)
Clinton Kirby, please, inform patient that his DM is not controlled He needs a better compliance with treatment and diet ROV in 1 mo with cbg readings bid  Thx

## 2011-11-16 NOTE — Telephone Encounter (Signed)
Pt informed/transferred to scheduler.  

## 2011-11-16 NOTE — Progress Notes (Signed)
Clinton Kirby Date of Birth: Sep 22, 1945 Medical Record #[id number]  History of Present Illness: [name] is seen at the request of Dr. Posey Rea to reestablish cardiac care. He is a 66 year old black male with known history of coronary disease. He is status post inferior myocardial infarction in February of 2004 while in [place]. This was treated with a drug-eluting stent to the right coronary. In 2005 he had some abnormality on a stress test and underwent repeat cardiac catheterization. This showed the stent to be widely patent and he had no other significant disease. His last stress test was in 2007. 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 He currently denies any chest pain, shortness of breath, or palpitations. He has had no edema. He states he has not followed up since he lost his insurance in 2008. He has a history of poorly controlled diabetes mellitus type 2. He also has a history of hyperlipidemia and hypertension.  Current Outpatient Prescriptions on File Prior to Visit  Medication Sig Dispense Refill  . aspirin 81 MG tablet Take 81 mg by mouth daily.        . Cholecalciferol 1000 UNITS tablet Take 1,000 Units by mouth daily.        . clotrimazole-betamethasone (LOTRISONE) cream Apply 1 application topically 2 (two) times daily.        [name] glimepiride (AMARYL) 4 MG tablet Take 1 tablet (4 mg total) by mouth daily before breakfast.  90 tablet  3  . lovastatin (MEVACOR) 20 MG tablet Take 2 tablets (40 mg total) by mouth at bedtime.  180 tablet  3  . metoprolol (TOPROL-XL) 50 MG 24 hr tablet Take 1 tablet (50 mg total) by mouth daily.  90 tablet  3  . polycarbophil (FIBERCON) 625 MG tablet Take 1,250 mg by mouth daily.        . Saxagliptin-Metformin (KOMBIGLYZE XR) 2.06-998 MG TB24 Take 1 tablet by mouth 2 (two) times daily.  200 tablet  3    No Known Allergies  Past Medical History  Diagnosis Date  . CAD (coronary artery disease)   . Diabetes  mellitus type II   . HTN (hypertension)   . ED (erectile dysfunction)   . Balanitis   . Hyperlipidemia     Past Surgical History  Procedure Date  . Umbilical hernia repair     History  Smoking status  . Never Smoker   Smokeless tobacco  . Not on file    History  Alcohol Use No    Family History  Problem Relation Age of Onset  . Diabetes Other   . Diabetes Mother   . Cancer Father     ?    Review of Systems: The review of systems is positive for a sedentary lifestyle. He claims that his blood pressure, diabetes, and lipids are under control but his recent evaluation by primary care would suggest otherwise. The only exercise he gets is when he helps to unload his truck. All other systems were reviewed and are negative.  Physical Exam: BP 150/92  Pulse 96  Ht 6' (1.829 m)  Wt 242 lb 12.8 oz (110.133 kg)  BMI 32.93 kg/m2  SpO2 98% He is an overweight black male in no acute distress.The patient is alert and oriented x 3.  The mood and affect are normal.  The skin is warm and dry.  Color is normal.  The HEENT exam reveals that the sclera are nonicteric.  The mucous  membranes are moist.  The carotids are 2+ without bruits.  There is no thyromegaly.  There is no JVD.  The lungs are clear.  The chest wall is non tender.  The heart exam reveals a regular rate with a normal S1 and S2.  There are no murmurs, gallops, or rubs.  The PMI is not displaced.   Abdominal exam reveals good bowel sounds.  There is no guarding or rebound.  There is no hepatosplenomegaly or tenderness.  There are no masses.  Exam of the legs reveal no clubbing, cyanosis, or edema.  The legs are without rashes.  The distal pulses are intact.  Cranial nerves II - XII are intact.  Motor and sensory functions are intact.  The gait is normal.  LABORATORY DATA: ECG demonstrates normal sinus rhythm with an old inferior infarction with Q waves in leads 2, 3, and aVF. Recent A1c was 11.9 Lab Results  Component Value  Date   CHOL 212* 05/18/2011   CHOL 158 11/13/2009   Lab Results  Component Value Date   HDL 45.70 05/18/2011   HDL 43.80 11/13/2009   No results found for this basename: [facility]   Lab Results  Component Value Date   TRIG 428.0* 05/18/2011   TRIG 299.0* 11/13/2009   Lab Results  Component Value Date   CHOLHDL 5 05/18/2011   CHOLHDL 4 11/13/2009   Lab Results  Component Value Date   LDLDIRECT 115.4 05/18/2011   LDLDIRECT 78.0 11/13/2009    Assessment / Plan: 1. Coronary disease status post inferior myocardial infarction February 2004 treated with direct stenting of the right coronary with a drug-eluting stent. Patient is asymptomatic. He needs followup stress testing for DOT clearance. We will tentatively schedule him for a stress Myoview. He has applied for health insurance and is expecting to hear back tomorrow about this. If he is unable to procure insurance we will just get a routine stress test although this will be less sensitive.  2. Hypertension, poorly controlled. Ideally blood pressure should be less than 130/80. Higher doses of metoprolol had left him fatigued in the past. I would consider adding an ACE inhibitor for optimal blood pressure control.  3. Diabetes mellitus, poorly controlled. Hopefully once he has adequate insurance he can afford his medications and take them regularly. Instructed him on appropriate diabetic diet.  4. Hyperlipidemia. Poorly controlled. I think this tracks his poor diabetic control. Hopefully with improvement in his diabetes his triglycerides will decrease. If not I would consider switching his lovastatin to Lipitor with a goal LDL of less than or equal to 70. If his triglycerides remain elevated adding a fenofibrate would be indicated. I will defer management of these risk factor issues to his primary care.

## 2011-11-16 NOTE — Patient Instructions (Addendum)
Continue your current medication.  Follow a diabetic diet, heart healthy  We will schedule you for a nuclear stress test to be done in 1 month.  Your physician wants you to follow-up in: 1 year You will receive a reminder letter in the mail two months in advance. If you don't receive a letter, please call our office to schedule the follow-up appointment.

## 2011-12-17 ENCOUNTER — Encounter (HOSPITAL_COMMUNITY): Payer: Medicare Other

## 2011-12-21 ENCOUNTER — Ambulatory Visit: Payer: Medicare Other | Admitting: Internal Medicine

## 2012-01-10 ENCOUNTER — Other Ambulatory Visit: Payer: Self-pay | Admitting: Internal Medicine

## 2012-02-15 ENCOUNTER — Ambulatory Visit: Payer: Medicare Other | Admitting: Internal Medicine

## 2012-02-15 DIAGNOSIS — Z0289 Encounter for other administrative examinations: Secondary | ICD-10-CM

## 2012-06-23 DIAGNOSIS — E1139 Type 2 diabetes mellitus with other diabetic ophthalmic complication: Secondary | ICD-10-CM | POA: Diagnosis not present

## 2012-06-23 DIAGNOSIS — H5210 Myopia, unspecified eye: Secondary | ICD-10-CM | POA: Diagnosis not present

## 2012-06-23 DIAGNOSIS — H524 Presbyopia: Secondary | ICD-10-CM | POA: Diagnosis not present

## 2012-06-23 DIAGNOSIS — H52229 Regular astigmatism, unspecified eye: Secondary | ICD-10-CM | POA: Diagnosis not present

## 2012-07-18 ENCOUNTER — Other Ambulatory Visit: Payer: Self-pay | Admitting: Internal Medicine

## 2012-08-06 ENCOUNTER — Other Ambulatory Visit: Payer: Self-pay | Admitting: Internal Medicine

## 2012-08-23 ENCOUNTER — Ambulatory Visit: Payer: Self-pay | Admitting: Family Medicine

## 2012-08-23 VITALS — BP 150/92 | HR 91 | Temp 97.9°F | Resp 18 | Ht 71.0 in | Wt 240.0 lb

## 2012-08-23 DIAGNOSIS — Z0289 Encounter for other administrative examinations: Secondary | ICD-10-CM

## 2012-08-23 NOTE — Progress Notes (Signed)
[facility]:  Office Visit  Chief Complaint:  Chief Complaint  Patient presents with  . Employment Physical    DOT    HPI: Clinton Kirby is a 67 y.o. male who complains of  Here for DOT PE. His DOT card expires next week on 09/04/2012.  States he takes all his medications regular, except for kombilyze XR 5 mg/1000mg  which he takes daily and not BID due to SE of deafness. He is afraid he will become deaf, he has not lost his hearinf. Last saw his cardiologist in 11/2011 He is not UTD on his stress test Last PCP visit was in 12/2011 His average sugars have been in 180-220s.     Last cardiac OV was in 11/2011: 1. Coronary disease status post inferior myocardial infarction February 2004 treated with direct stenting of the right coronary with a drug-eluting stent. Patient is asymptomatic. He needs followup stress testing for DOT clearance. We will tentatively schedule him for a stress Myoview. He has applied for health insurance and is expecting to hear back tomorrow about this. If he is unable to procure insurance we will just get a routine stress test although this will be less sensitive.  2. Hypertension, poorly controlled. Ideally blood pressure should be less than 130/80. Higher doses of metoprolol had left him fatigued in the past. I would consider adding an ACE inhibitor for optimal blood pressure control.  3. Diabetes mellitus, poorly controlled. Hopefully once he has adequate insurance he can afford his medications and take them regularly. Instructed him on appropriate diabetic diet.  4. Hyperlipidemia. Poorly controlled. I think this tracks his poor diabetic control. Hopefully with improvement in his diabetes his triglycerides will decrease. If not I would consider switching his lovastatin to Lipitor with a goal LDL of less than or equal to 70. If his triglycerides remain elevated adding a fenofibrate would be indicated. I will defer management of these risk  factor issues to his primary care.   Past Medical History  Diagnosis Date  . CAD (coronary artery disease)   . Diabetes mellitus type II   . HTN (hypertension)   . ED (erectile dysfunction)   . Balanitis   . Hyperlipidemia    Past Surgical History  Procedure Laterality Date  . Umbilical hernia repair     History   Social History  . Marital Status: Married    Spouse Name: N/A    Number of Children: 1  . Years of Education: N/A   Occupational History  . truck driver-self employed    Social History Main Topics  . Smoking status: Never Smoker   . Smokeless tobacco: None  . Alcohol Use: No  . Drug Use: No  . Sexually Active: Yes   Other Topics Concern  . None   Social History Narrative  . None   Family History  Problem Relation Age of Onset  . Diabetes Other   . Diabetes Mother   . Cancer Father     ?   No Known Allergies Prior to Admission medications   Medication Sig Start Date End Date Taking? Authorizing Provider  aspirin 81 MG tablet Take 81 mg by mouth daily.     Yes Historical Provider, MD  glimepiride (AMARYL) 4 MG tablet TAKE ONE TABLET BY MOUTH EVERY DAY BEFORE  BREAKFAST 01/10/12  Yes Georgina Quint Plotnikov, MD  lovastatin (MEVACOR) 20 MG tablet TAKE TWO TABLETS BY MOUTH AT BEDTIME 07/18/12  Yes Tresa Garter, MD  metFORMIN (  GLUCOPHAGE) 1000 MG tablet TAKE ONE TABLET BY MOUTH TWICE DAILY WITH  A  MEAL 07/18/12  Yes Georgina Quint Plotnikov, MD  metoprolol (TOPROL-XL) 50 MG 24 hr tablet Take 1 tablet (50 mg total) by mouth daily. 12/31/10  Yes Tresa Garter, MD  Cholecalciferol 1000 UNITS tablet Take 1,000 Units by mouth daily.      Historical Provider, MD  clotrimazole-betamethasone (LOTRISONE) cream APPLY TO AFFECTED AREA TWICE DAILY FOR 1 MONTH 08/06/12   Georgina Quint Plotnikov, MD  polycarbophil (FIBERCON) 625 MG tablet Take 1,250 mg by mouth daily.      Historical Provider, MD  Saxagliptin-Metformin (KOMBIGLYZE XR) 2.06-998 MG TB24 Take 1 tablet by mouth  2 (two) times daily. 05/18/11   Georgina Quint Plotnikov, MD     ROS: The patient denies fevers, chills, night sweats, unintentional weight loss, chest pain, palpitations, wheezing, dyspnea on exertion, nausea, vomiting, abdominal pain, dysuria, hematuria, melena, numbness, weakness, or tingling.  All other systems have been reviewed and were otherwise negative with the exception of those mentioned in the HPI and as above.    PHYSICAL EXAM: Filed Vitals:   08/23/12 1929  BP: 150/92  Pulse: 91  Temp: 97.9 F (36.6 C)  Resp: 18   Filed Vitals:   08/23/12 1929  Height: 5\' 11"  (1.803 m)  Weight: 240 lb (108.863 kg)   Body mass index is 33.49 kg/(m^2).  General: Alert, no acute distress HEENT:  Normocephalic, atraumatic, oropharynx patent.  Cardiovascular:  Regular rate and rhythm, no rubs murmurs or gallops.  No Carotid bruits, radial pulse intact. No pedal edema.  Respiratory: Clear to auscultation bilaterally.  No wheezes, rales, or rhonchi.  No cyanosis, no use of accessory musculature GI: No organomegaly, abdomen is soft and non-tender, positive bowel sounds.  No masses. Skin: No rashes. Neurologic: Facial musculature symmetric. Micorfilament exam normal Psychiatric: Patient is appropriate throughout our interaction. Lymphatic: No cervical lymphadenopathy Musculoskeletal: Gait intact.   LABS: Results for orders placed in visit on 11/13/11  BASIC METABOLIC PANEL      Result Value Range   Sodium 134 (*) 135 - 145 mEq/L   Potassium 4.0  3.5 - 5.1 mEq/L   Chloride 101  96 - 112 mEq/L   CO2 25  19 - 32 mEq/L   Glucose, Bld 225 (*) 70 - 99 mg/dL   BUN 16  6 - 23 mg/dL   Creatinine, Ser 0.9  0.4 - 1.5 mg/dL   Calcium 16.1  8.4 - 09.6 mg/dL   GFR 045.40  >98.11 mL/min  HEMOGLOBIN A1C      Result Value Range   Hemoglobin A1C 11.9 (*) 4.6 - 6.5 %     EKG/XRAY:   Primary read interpreted by Dr. Conley Rolls at [facility].   ASSESSMENT/PLAN: Encounter Diagnosis  Name Primary?  . Health  examination of defined subpopulation Yes   I did not certify patient for DOT He is noncomplaint with f/u with diabetes and also his cardiologist for CAD s/p drug eluting stent Need to be seen by PCP for Dm and Hyperlipidemia and also f/u with cardiology for stress testing due to CAD s/p drug eluting stent Advise he can take [facility] as directed and his DM may be better controlled otherwise there will be DM complications with poorly controlled Diabetes. Patient understands risks  F/u for DOT PE after these are done      Cate Oravec PHUONG, DO 08/23/2012 8:24 PM

## 2012-08-25 ENCOUNTER — Encounter: Payer: Self-pay | Admitting: Family Medicine

## 2012-08-26 ENCOUNTER — Encounter: Payer: Self-pay | Admitting: Internal Medicine

## 2012-08-26 ENCOUNTER — Ambulatory Visit (INDEPENDENT_AMBULATORY_CARE_PROVIDER_SITE_OTHER): Payer: Medicare Other | Admitting: Internal Medicine

## 2012-08-26 ENCOUNTER — Other Ambulatory Visit (INDEPENDENT_AMBULATORY_CARE_PROVIDER_SITE_OTHER): Payer: Medicare Other

## 2012-08-26 VITALS — BP 148/80 | HR 91 | Temp 98.1°F | Ht 69.0 in | Wt 242.0 lb

## 2012-08-26 DIAGNOSIS — I10 Essential (primary) hypertension: Secondary | ICD-10-CM

## 2012-08-26 DIAGNOSIS — I2510 Atherosclerotic heart disease of native coronary artery without angina pectoris: Secondary | ICD-10-CM

## 2012-08-26 DIAGNOSIS — E1165 Type 2 diabetes mellitus with hyperglycemia: Secondary | ICD-10-CM

## 2012-08-26 DIAGNOSIS — E785 Hyperlipidemia, unspecified: Secondary | ICD-10-CM | POA: Diagnosis not present

## 2012-08-26 LAB — BASIC METABOLIC PANEL
CO2: 26 mEq/L (ref 19–32)
Chloride: 106 mEq/L (ref 96–112)
Creatinine, Ser: 0.9 mg/dL (ref 0.4–1.5)
Sodium: 139 mEq/L (ref 135–145)

## 2012-08-26 LAB — URINALYSIS, ROUTINE W REFLEX MICROSCOPIC
Bilirubin Urine: NEGATIVE
Nitrite: NEGATIVE
Specific Gravity, Urine: >=1.03 (ref 1.000–1.030)
Total Protein, Urine: NEGATIVE
pH: 6 (ref 5.0–8.0)

## 2012-08-26 LAB — HEPATIC FUNCTION PANEL
ALT: 22 U/L (ref 0–53)
AST: 20 U/L (ref 0–37)
Bilirubin, Direct: 0 mg/dL (ref 0.0–0.3)
Total Bilirubin: 0.5 mg/dL (ref 0.3–1.2)

## 2012-08-26 MED ORDER — CLOTRIMAZOLE-BETAMETHASONE 1-0.05 % EX CREA: TOPICAL_CREAM | CUTANEOUS | Status: DC

## 2012-08-26 MED ORDER — ASPIRIN 325 MG PO TABS: 325.0000 mg | ORAL_TABLET | Freq: Every day | ORAL | Status: DC

## 2012-08-26 MED ORDER — METOPROLOL SUCCINATE ER 50 MG PO TB24: 50.0000 mg | ORAL_TABLET | Freq: Every day | ORAL | Status: DC

## 2012-08-26 NOTE — Assessment & Plan Note (Signed)
Stress test next Tue

## 2012-08-26 NOTE — Progress Notes (Signed)
Patient ID: Clinton Kirby, male   DOB: 01/13/1946, 67 y.o.   MRN: [id number]  Subjective:    Patient ID: Clinton Kirby, male    DOB: 10-05-1945, 67 y.o.   MRN: [id number]  HPI The patient presents for a follow-up of  chronic hypertension, chronic dyslipidemia, type 2 diabetes, CAD controlled with medicines  BP Readings from Last 3 Encounters:  08/26/12 148/80  08/23/12 150/92  11/16/11 150/92   Wt Readings from Last 3 Encounters:  08/26/12 242 lb (109.77 kg)  08/23/12 240 lb (108.863 kg)  11/16/11 242 lb 12.8 oz (110.133 kg)       Review of Systems  Constitutional: Positive for fatigue. Negative for appetite change and unexpected weight change.  HENT: Negative for nosebleeds, congestion, sore throat, sneezing, trouble swallowing and neck pain.   Eyes: Negative for itching and visual disturbance.  Respiratory: Negative for cough.   Cardiovascular: Negative for chest pain, palpitations and leg swelling.  Gastrointestinal: Negative for nausea, diarrhea, blood in stool and abdominal distention.  Genitourinary: Negative for frequency and hematuria.  Musculoskeletal: Negative for back pain, joint swelling and gait problem.  Skin: Negative for rash.  Neurological: Negative for dizziness, tremors, speech difficulty and weakness.  Psychiatric/Behavioral: Negative for sleep disturbance, dysphoric mood and agitation. The patient is not nervous/anxious.        Objective:   Physical Exam  Constitutional: He is oriented to person, place, and time. He appears well-developed.  obese  HENT:  Mouth/Throat: Oropharynx is clear and moist.  Eyes: Conjunctivae are normal. Pupils are equal, round, and reactive to light.  Neck: Normal range of motion. No JVD present. No thyromegaly present.  Cardiovascular: Normal rate, regular rhythm, normal heart sounds and intact distal pulses.  Exam reveals no gallop and no friction rub.   No murmur heard. Pulmonary/Chest: Effort normal and breath  sounds normal. No respiratory distress. He has no wheezes. He has no rales. He exhibits no tenderness.  Abdominal: Soft. Bowel sounds are normal. He exhibits no distension and no mass. There is no tenderness. There is no rebound and no guarding.  Musculoskeletal: Normal range of motion. He exhibits no edema and no tenderness.  Lymphadenopathy:    He has no cervical adenopathy.  Neurological: He is alert and oriented to person, place, and time. He has normal reflexes. No cranial nerve deficit. He exhibits normal muscle tone. Coordination normal.  Skin: Skin is warm and dry. No rash noted.  Psychiatric: He has a normal mood and affect. His behavior is normal. Judgment and thought content normal.   Lab Results  Component Value Date   GLUCOSE 225* 11/13/2011   CHOL 212* 05/18/2011   TRIG 428.0* 05/18/2011   HDL 45.70 05/18/2011   LDLDIRECT 115.4 05/18/2011   ALT 20 05/18/2011   AST 17 05/18/2011   NA 134* 11/13/2011   K 4.0 11/13/2011   CL 101 11/13/2011   CREATININE 0.9 11/13/2011   BUN 16 11/13/2011   CO2 25 11/13/2011   TSH 1.03 05/18/2011   HGBA1C 11.9* 11/13/2011           Assessment & Plan:

## 2012-08-26 NOTE — Assessment & Plan Note (Signed)
Continue with current prescription therapy as reflected on the Med list.  

## 2012-08-26 NOTE — Assessment & Plan Note (Signed)
Complicated control issues.  He is a [employer]

## 2012-08-27 ENCOUNTER — Other Ambulatory Visit: Payer: Self-pay | Admitting: Internal Medicine

## 2012-08-27 ENCOUNTER — Encounter: Payer: Self-pay | Admitting: Internal Medicine

## 2012-08-27 DIAGNOSIS — E1110 Type 2 diabetes mellitus with ketoacidosis without coma: Secondary | ICD-10-CM

## 2012-08-27 NOTE — Assessment & Plan Note (Signed)
Continue with current prescription therapy as reflected on the Med list.  

## 2012-08-30 ENCOUNTER — Other Ambulatory Visit: Payer: Self-pay | Admitting: Internal Medicine

## 2012-08-30 ENCOUNTER — Ambulatory Visit (HOSPITAL_COMMUNITY): Payer: Medicare Other | Attending: Cardiology | Admitting: Radiology

## 2012-08-30 VITALS — BP 170/100 | Ht 69.0 in | Wt 238.0 lb

## 2012-08-30 DIAGNOSIS — E119 Type 2 diabetes mellitus without complications: Secondary | ICD-10-CM | POA: Insufficient documentation

## 2012-08-30 DIAGNOSIS — Z0289 Encounter for other administrative examinations: Secondary | ICD-10-CM | POA: Diagnosis not present

## 2012-08-30 DIAGNOSIS — I252 Old myocardial infarction: Secondary | ICD-10-CM | POA: Insufficient documentation

## 2012-08-30 DIAGNOSIS — I2510 Atherosclerotic heart disease of native coronary artery without angina pectoris: Secondary | ICD-10-CM | POA: Diagnosis not present

## 2012-08-30 DIAGNOSIS — I10 Essential (primary) hypertension: Secondary | ICD-10-CM | POA: Insufficient documentation

## 2012-08-30 DIAGNOSIS — Z9861 Coronary angioplasty status: Secondary | ICD-10-CM | POA: Insufficient documentation

## 2012-08-30 LAB — NMR LIPOPROFILE WITHOUT LIPIDS
HDL Size: 8.6 nm — ABNORMAL LOW (ref >=9.2)
Large VLDL-P: 6.5 nmol/L — ABNORMAL HIGH (ref <=2.7)
Small LDL Particle Number: 828 nmol/L — ABNORMAL HIGH (ref <=527)
VLDL Size: 47.8 nm — ABNORMAL HIGH (ref <=46.6)

## 2012-08-30 MED ORDER — TECHNETIUM TC 99M SESTAMIBI GENERIC - CARDIOLITE
11.0000 | Freq: Once | INTRAVENOUS | Status: AC | PRN
  Administered 2012-08-30: 11 via INTRAVENOUS

## 2012-08-30 MED ORDER — ATORVASTATIN CALCIUM 40 MG PO TABS: 40.0000 mg | ORAL_TABLET | Freq: Every day | ORAL | Status: DC

## 2012-08-30 MED ORDER — TECHNETIUM TC 99M SESTAMIBI GENERIC - CARDIOLITE
33.0000 | Freq: Once | INTRAVENOUS | Status: AC | PRN
  Administered 2012-08-30: 33 via INTRAVENOUS

## 2012-08-30 NOTE — Progress Notes (Signed)
[facility] 3 NUCLEAR MED [address] [place], [place] [address] [phone]    Cardiology Nuclear Med Study  Clinton Kirby is a 67 y.o. male     MRN : [id number]     DOB: 1945-10-27  Procedure Date: 08/30/2012  Nuclear Med Background Indication for Stress Test:  Evaluation for Ischemia, PTCA/Stent Patency, and DOT Clearance History:  2004 MI- IWMI-Angioplasty/Stents: RCA, ECHO: Nl EF, 2005 Heart Cath: patent Stent  no other sig. Dz  2007 MPS: inferobasal infarct with mild peri-infarct ischemia EF: 56% Cardiac Risk Factors: Hypertension, Lipids and NIDDM  Symptoms:  n/a   Nuclear Pre-Procedure Caffeine/Decaff Intake:  None> 12 hrs NPO After: 7:30am   Lungs:  clear O2 Sat: 96% on room air. IV 0.9% NS with Angio Cath:  22g  IV Site: R Antecubital x 1, tolerated well IV Started by:  Irean Hong, RN  Chest Size (in):  46 Cup Size: n/a  Height: 5\' 9"  (1.753 m)  Weight:  238 lb (107.956 kg)  BMI:  Body mass index is 35.13 kg/(m^2). Tech Comments:  Ran out Metoprolol 3 or 4 months, refilled recently per patient, but no restarted yet.    Nuclear Med Study 1 or 2 day study: 1 day  Stress Test Type:  Stress  Reading MD: Olga Millers, MD  Order Authorizing Provider:  Peter Swaziland, MD  Resting Radionuclide: Technetium 73m Sestamibi  Resting Radionuclide Dose: 11.0 mCi   Stress Radionuclide:  Technetium 62m Sestamibi  Stress Radionuclide Dose: 33.0 mCi           Stress Protocol Rest HR: 90 Stress HR: 162  Rest BP: 170/100 Stress BP: 220/83  Exercise Time (min): 6:00 METS: 7.0   Predicted Max HR: 155 bpm % Max HR: 104.52 bpm Rate Pressure Product: 21308   Dose of Adenosine (mg):  n/a Dose of Lexiscan: n/a mg  Dose of Atropine (mg): n/a Dose of Dobutamine: n/a mcg/kg/min (at max HR)  Stress Test Technologist: Milana Na, EMT-P  Nuclear Technologist:  Harlow Asa, CNMT     Rest Procedure:  Myocardial perfusion imaging was performed at rest 45  minutes following the intravenous administration of Technetium 62m Sestamibi. Rest ECG: NSR, IRBBB, inferior MI.  Stress Procedure:  The patient exercised on the treadmill utilizing the Bruce Protocol for 6:00 minutes. The patient stopped due to fatigue and denied any chest pain.  Technetium 18m Sestamibi was injected at peak exercise and myocardial perfusion imaging was performed after a brief delay. Stress ECG: No significant ST segment change suggestive of ischemia.  QPS Raw Data Images:  Acquisition technically good; normal left ventricular size. Stress Images:  There is decreased uptake in the inferior wall. Rest Images:  There is decreased uptake in the inferior wall. Subtraction (SDS):  There is a fixed defect that is most consistent with a previous infarction. Transient Ischemic Dilatation (Normal <1.22):  NA Lung/Heart Ratio (Normal <0.45):  0.42  Quantitative Gated Spect Images QGS EDV:  87 ml QGS ESV:  49 ml  Impression Exercise Capacity:  Fair exercise capacity. BP Response:  Hypertensive blood pressure response. Clinical Symptoms:  There is chest pain. ECG Impression:  No significant ST segment change suggestive of ischemia. Comparison with Prior Nuclear Study: No images to compare  Overall Impression:  Low risk stress nuclear study with a moderate size, severe intensity, fixed inferior defect consistent with prior infarct; no significant ischemia.  LV Ejection Fraction: 43%.  LV Wall Motion:  Wall motion appears to  be normal; LV function appears better than calculated EF; suggest echo to better assess.  Olga Millers

## 2012-08-31 ENCOUNTER — Telehealth: Payer: Self-pay | Admitting: Cardiology

## 2012-08-31 NOTE — Telephone Encounter (Signed)
Follow Up ° ° ° °Pt following up on results. Please call. °

## 2012-08-31 NOTE — Telephone Encounter (Signed)
Returned call to patient myoview results given. 

## 2012-09-01 ENCOUNTER — Telehealth: Payer: Self-pay | Admitting: Internal Medicine

## 2012-09-01 ENCOUNTER — Ambulatory Visit: Payer: Self-pay

## 2012-09-01 NOTE — Telephone Encounter (Signed)
Patient had DOT Physical with [facility].  They are requesting a letter from Dr. Posey Rea stating that diabetes is controlled.  Patient needs letter before 09/15/2012.  Patient is requesting the letter to be faxed to [facility] at [phone].

## 2012-09-27 ENCOUNTER — Ambulatory Visit: Payer: Medicare Other | Admitting: Endocrinology

## 2012-12-07 ENCOUNTER — Ambulatory Visit (INDEPENDENT_AMBULATORY_CARE_PROVIDER_SITE_OTHER): Payer: Medicare Other | Admitting: Internal Medicine

## 2012-12-07 ENCOUNTER — Ambulatory Visit: Payer: Medicare Other | Admitting: Internal Medicine

## 2012-12-07 ENCOUNTER — Encounter: Payer: Self-pay | Admitting: Internal Medicine

## 2012-12-07 VITALS — BP 140/94 | HR 87 | Temp 97.4°F | Ht 71.0 in | Wt 240.2 lb

## 2012-12-07 DIAGNOSIS — I10 Essential (primary) hypertension: Secondary | ICD-10-CM

## 2012-12-07 DIAGNOSIS — E785 Hyperlipidemia, unspecified: Secondary | ICD-10-CM

## 2012-12-07 DIAGNOSIS — E1165 Type 2 diabetes mellitus with hyperglycemia: Secondary | ICD-10-CM

## 2012-12-07 MED ORDER — CANAGLIFLOZIN 300 MG PO TABS: 300.0000 mg | ORAL_TABLET | Freq: Every day | ORAL | Status: DC

## 2012-12-07 MED ORDER — LOSARTAN POTASSIUM 100 MG PO TABS: 100.0000 mg | ORAL_TABLET | Freq: Every day | ORAL | Status: DC

## 2012-12-07 MED ORDER — CLOTRIMAZOLE-BETAMETHASONE 1-0.05 % EX CREA: TOPICAL_CREAM | CUTANEOUS | Status: DC

## 2012-12-07 MED ORDER — METFORMIN HCL 1000 MG PO TABS: ORAL_TABLET | ORAL | Status: DC

## 2012-12-07 NOTE — Assessment & Plan Note (Signed)
Continue with current prescription therapy as reflected on the Med list.  

## 2012-12-07 NOTE — Progress Notes (Signed)
   Subjective:    HPI The patient presents for a follow-up of  chronic hypertension, chronic dyslipidemia, type 2 diabetes, CAD controlled with medicines. He had a DMV well exam and was found to have elevated CBG and BP - off work x 3 wks  BP Readings from Last 3 Encounters:  12/07/12 140/94  08/30/12 170/100  08/26/12 148/80   Wt Readings from Last 3 Encounters:  12/07/12 240 lb 4 oz (108.977 kg)  08/30/12 238 lb (107.956 kg)  08/26/12 242 lb (109.77 kg)       Review of Systems  Constitutional: Positive for fatigue. Negative for appetite change and unexpected weight change.  HENT: Negative for congestion, nosebleeds, sneezing, sore throat and trouble swallowing.   Eyes: Negative for itching and visual disturbance.  Respiratory: Negative for cough.   Cardiovascular: Negative for chest pain, palpitations and leg swelling.  Gastrointestinal: Negative for nausea, diarrhea, blood in stool and abdominal distention.  Genitourinary: Negative for frequency and hematuria.  Musculoskeletal: Negative for back pain, gait problem, joint swelling and neck pain.  Skin: Negative for rash.  Neurological: Negative for dizziness, tremors, speech difficulty and weakness.  Psychiatric/Behavioral: Negative for sleep disturbance, dysphoric mood and agitation. The patient is not nervous/anxious.        Objective:   Physical Exam  Constitutional: He is oriented to person, place, and time. He appears well-developed.  obese  HENT:  Mouth/Throat: Oropharynx is clear and moist.  Eyes: Conjunctivae are normal. Pupils are equal, round, and reactive to light.  Neck: Normal range of motion. No JVD present. No thyromegaly present.  Cardiovascular: Normal rate, regular rhythm, normal heart sounds and intact distal pulses.  Exam reveals no gallop and no friction rub.   No murmur heard. Pulmonary/Chest: Effort normal and breath sounds normal. No respiratory distress. He has no wheezes. He has no rales. He  exhibits no tenderness.  Abdominal: Soft. Bowel sounds are normal. He exhibits no distension and no mass. There is no tenderness. There is no rebound and no guarding.  Musculoskeletal: Normal range of motion. He exhibits no edema and no tenderness.  Lymphadenopathy:    He has no cervical adenopathy.  Neurological: He is alert and oriented to person, place, and time. He has normal reflexes. No cranial nerve deficit. He exhibits normal muscle tone. Coordination normal.  Skin: Skin is warm and dry. No rash noted.  Psychiatric: He has a normal mood and affect. His behavior is normal. Judgment and thought content normal.   Lab Results  Component Value Date   GLUCOSE 231* 08/26/2012   CHOL 212* 05/18/2011   TRIG 428.0* 05/18/2011   HDL 45.70 05/18/2011   LDLDIRECT 115.4 05/18/2011   ALT 22 08/26/2012   AST 20 08/26/2012   NA 139 08/26/2012   K 4.0 08/26/2012   CL 106 08/26/2012   CREATININE 0.9 08/26/2012   BUN 14 08/26/2012   CO2 26 08/26/2012   TSH 0.97 08/26/2012   HGBA1C 12.0* 08/26/2012    A complex case       Assessment & Plan:

## 2012-12-07 NOTE — Patient Instructions (Signed)
Stop Kombiglyze - too $$$ Start Invokana 1 a day Take Metformin 1 twice a day

## 2012-12-07 NOTE — Assessment & Plan Note (Signed)
D/c Kombiglyze - too $$$ Start Invokana 1 a day

## 2012-12-07 NOTE — Assessment & Plan Note (Signed)
Start Losartan Invokana should help BP as well

## 2012-12-14 ENCOUNTER — Ambulatory Visit: Payer: Medicare Other | Admitting: Internal Medicine

## 2012-12-20 ENCOUNTER — Telehealth: Payer: Self-pay | Admitting: *Deleted

## 2012-12-20 ENCOUNTER — Other Ambulatory Visit (INDEPENDENT_AMBULATORY_CARE_PROVIDER_SITE_OTHER): Payer: Medicare Other

## 2012-12-20 DIAGNOSIS — E785 Hyperlipidemia, unspecified: Secondary | ICD-10-CM | POA: Diagnosis not present

## 2012-12-20 DIAGNOSIS — E1100 Type 2 diabetes mellitus with hyperosmolarity without nonketotic hyperglycemic-hyperosmolar coma (NKHHC): Secondary | ICD-10-CM

## 2012-12-20 DIAGNOSIS — I10 Essential (primary) hypertension: Secondary | ICD-10-CM | POA: Diagnosis not present

## 2012-12-20 DIAGNOSIS — IMO0001 Reserved for inherently not codable concepts without codable children: Secondary | ICD-10-CM

## 2012-12-20 DIAGNOSIS — E1165 Type 2 diabetes mellitus with hyperglycemia: Secondary | ICD-10-CM

## 2012-12-20 LAB — BASIC METABOLIC PANEL
BUN: 17 mg/dL (ref 6–23)
CO2: 25 mEq/L (ref 19–32)
Chloride: 100 mEq/L (ref 96–112)
Creatinine, Ser: 1 mg/dL (ref 0.4–1.5)
Glucose, Bld: 278 mg/dL — ABNORMAL HIGH (ref 70–99)
Potassium: 4.1 mEq/L (ref 3.5–5.1)

## 2012-12-20 NOTE — Telephone Encounter (Signed)
Pt called requesting lab results. Please advise.  

## 2012-12-20 NOTE — Telephone Encounter (Signed)
Labs are very high (no change from before) - I will sch appt w/Dr Everardo All (Endocr) Thx

## 2012-12-21 NOTE — Telephone Encounter (Signed)
Spoke with pt advised of MDs message 

## 2012-12-22 ENCOUNTER — Encounter: Payer: Self-pay | Admitting: Endocrinology

## 2012-12-22 ENCOUNTER — Ambulatory Visit (INDEPENDENT_AMBULATORY_CARE_PROVIDER_SITE_OTHER): Payer: Medicare Other | Admitting: Endocrinology

## 2012-12-22 VITALS — BP 118/78 | HR 104 | Temp 98.3°F | Resp 12 | Ht 72.0 in | Wt 239.3 lb

## 2012-12-22 DIAGNOSIS — E785 Hyperlipidemia, unspecified: Secondary | ICD-10-CM

## 2012-12-22 DIAGNOSIS — E1165 Type 2 diabetes mellitus with hyperglycemia: Secondary | ICD-10-CM

## 2012-12-22 DIAGNOSIS — I10 Essential (primary) hypertension: Secondary | ICD-10-CM | POA: Diagnosis not present

## 2012-12-22 LAB — BASIC METABOLIC PANEL: Glucose: 203 mg/dL

## 2012-12-22 NOTE — Patient Instructions (Addendum)
Take Invokana before Bfst   Take Metformin twice daily with food  Glimeperide 4mg , 1 in am and 1 before supper  Please check blood sugars at least half the time about 2 hours after any meal and as directed on waking up. Please bring blood sugar monitor to each visit

## 2012-12-22 NOTE — Progress Notes (Signed)
Patient ID: Clinton Kirby, male   DOB: 07-01-1945, 67 y.o.   MRN: [id number]    Reason for Appointment : Consultation for Type 2 Diabetes  History of Present Illness          Diagnosis: Type 2 diabetes mellitus, date of diagnosis:2004      Past history: At the time of diagnosis he was having frequent urination and his glucose was about 300 He thinks he was put on metformin and glimepiride at that time He believes his blood sugars were fairly well-controlled for the first few years  However review of his records indicate that his A1c has been around 12% since at least 2011  Recent history: Apparently because of his work requirement and his A1c be below 10 % he was recently seen in his primary care physician's office He was started on Invokana on 12/07/12. He thinks his blood sugar prior to this was about 180-190 fasting However his lab glucose readings are usually over 200 and glucose was 278  nonfasting this week He does tend to have increased thirst and urination especially at night but no unusual fatigue or blurred vision He is taking the Invokana at night along with his Amaryl. Takes metformin at lunchtime Previously was on 1000 mg twice a day of metformin but this was reduced because he thought it was causing anxiety Usually takes metformin with food He is having difficulty affording his medications because of lack of insurance    Glucose monitoring:  done one-2 times a day         Glucometer: Freestyle.      Blood Glucose readings from recall: readings before breakfast: Recently 118, 147 pc      Hypoglycemia frequency: Never.           Self-care: The diet that the patient has been following is: Avoiding sweets and regular soft drinks     Meals: 3 meals per day.  for breakfast has  eggs, meat, toast but sometimes cereal. Tries to eat salads sometimes       Physical activity: exercise: Started walking recently         Dietician visit: Most recent: ? A diagnosis ago                 Oral hypoglycemic drugs the patient is taking are: Amaryl 4 mg at supper, metformin at lunch and Invokana at supper     Side effects from medications have been: Minimal, mild balanitis with Invokana and increased urination Retinal exam: Most recent: 2014  Wt Readings from Last 3 Encounters:  12/22/12 239 lb 4.8 oz (108.546 kg)  12/07/12 240 lb 4 oz (108.977 kg)  08/30/12 238 lb (107.956 kg)     Lab Results  Component Value Date   HGBA1C 12.0* 12/20/2012   HGBA1C 12.0* 08/26/2012   HGBA1C 11.9* 11/13/2011   Lab Results  Component Value Date   CREATININE 1.0 12/20/2012       Medication List       This list is accurate as of: 12/22/12  2:37 PM.  Always use your most recent med list.               aspirin 325 MG tablet  Commonly known as:  BAYER ASPIRIN  Take 1 tablet (325 mg total) by mouth daily.     atorvastatin 40 MG tablet  Commonly known as:  LIPITOR  Take 1 tablet (40 mg total) by mouth daily.     Canagliflozin 300 MG Tabs  Commonly known as:  INVOKANA  Take 1 tablet (300 mg total) by mouth daily.     Cholecalciferol 1000 UNITS tablet  Take 1,000 Units by mouth daily.     clotrimazole-betamethasone cream  Commonly known as:  LOTRISONE  APPLY TO AFFECTED AREA TWICE DAILY FOR 1 MONTH     glimepiride 4 MG tablet  Commonly known as:  AMARYL  TAKE ONE TABLET BY MOUTH EVERY DAY BEFORE  BREAKFAST     losartan 100 MG tablet  Commonly known as:  COZAAR  Take 1 tablet (100 mg total) by mouth daily.     metFORMIN 1000 MG tablet  Commonly known as:  GLUCOPHAGE  TAKE ONE TABLET BY MOUTH TWICE DAILY WITH  A  MEAL     metoprolol succinate 50 MG 24 hr tablet  Commonly known as:  TOPROL-XL  Take 1 tablet (50 mg total) by mouth daily.     polycarbophil 625 MG tablet  Commonly known as:  FIBERCON  Take 1,250 mg by mouth daily.        Allergies: No Known Allergies  Past Medical History  Diagnosis Date  . CAD (coronary artery disease)   . Diabetes  mellitus type II   . HTN (hypertension)   . ED (erectile dysfunction)   . Balanitis   . Hyperlipidemia     Past Surgical History  Procedure Laterality Date  . Umbilical hernia repair      Family History  Problem Relation Age of Onset  . Diabetes Other   . Diabetes Mother   . Cancer Father     ?    Social History:  reports that he has never smoked. He does not have any smokeless tobacco history on file. He reports that he does not drink alcohol or use illicit drugs.    Review of Systems       Lipids: No recent labs available. Has had mild increase in LDL and also significant increase in triglycerides despite taking Lipitor               Skin: No rash or infections     Thyroid:  No  unusual fatigue.     The blood pressure has been      No swelling of feet.     No shortness of breath or chest pain on exertion. Has history of MI     Bowel habits: Normal.       Has  frequency of urination and also nocturia 3-4 times       No joint  pains.        Sometimes at night he will have some burning and tingling in his feet, not interfering with sleep, this is not new   LABS:   Appointment on 12/20/2012  Component Date Value Range Status  . Hemoglobin A1C 12/20/2012 12.0* 4.6 - 6.5 % Final   Glycemic Control Guidelines for People with Diabetes:Non Diabetic:  <6%Goal of Therapy: <7%Additional Action Suggested:  >8%   . Sodium 12/20/2012 135  135 - 145 mEq/L Final  . Potassium 12/20/2012 4.1  3.5 - 5.1 mEq/L Final  . Chloride 12/20/2012 100  96 - 112 mEq/L Final  . CO2 12/20/2012 25  19 - 32 mEq/L Final  . Glucose, Bld 12/20/2012 278* 70 - 99 mg/dL Final  . BUN 40/98/1191 17  6 - 23 mg/dL Final  . Creatinine, Ser 12/20/2012 1.0  0.4 - 1.5 mg/dL Final  . Calcium 47/82/9562 9.3  8.4 - 10.5 mg/dL Final  .  GFR 12/20/2012 98.56  >60.00 mL/min Final    Physical Examination:  BP 118/78  Pulse 104  Temp(Src) 98.3 F (36.8 C)  Resp 12  Ht 6' (1.829 m)  Wt 239 lb 4.8 oz  (108.546 kg)  BMI 32.45 kg/m2  SpO2 97%  GENERAL:         Patient has generalized obesity.   HEENT:         Eye exam shows normal external appearance. Fundus exam shows no retinopathy. Oral exam shows normal mucosa .  NECK:         General:  Neck exam shows no lymphadenopathy. Carotids are normal to palpation and no bruit heard. Thyroid is not enlarged and no nodules felt.   LUNGS:         Chest is symmetrical. Lungs are clear to auscultation.[name]   HEART:         Heart sounds:  S1 and S2 are normal. No murmurs or clicks heard., no S3 or S4.   ABDOMEN:         General:  There is no distention present. Liver and spleen are not palpable. No other mass or tenderness present.  EXTREMITIES:     There is no edema. No skin lesions present.[name]  NEUROLOGICAL:        Vibration sense is  moderately   reduced in toes. Ankle jerks are absent bilaterally.          Diabetic foot exam:  as in the foot exam section MUSCULOSKELETAL:       There is no enlargement or deformity of the joints. Spine is normal to inspection.[name]   PEDAL pulses: normal  SKIN:       No rash. Onychomycosis of toenails present         ASSESSMENT:  Diabetes type 2, uncontrolled   He has had persistently high A1c of 12% and is currently taking metformin low dose along with Amaryl 4 mg. Recently started on Invokana. Tolerating this well except for some increased urination and mild balanitis He claims that his blood sugars are near-normal in the morning but his glucose nonfasting is over 200 both on Tuesday and today Not clear if his home monitor is accurate Also difficult to get him on  Medication plan since he has no drug insurance He is a good candidate for a GLP-1 drug like Victoza but cannot afford this. 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  Complications:Mild neuropathy  HYPERLIPIDEMIA: Has had high triglycerides and last LDL was over 100 Will reassess when blood sugars are better  controlled  HYPERTENSION: Well controlled currently   PLAN:   Would try to maximize his current doses of metformin and Amaryl for now  He will take Amaryl and metformin both twice a day He can check with the pharmacy to see if the 500 mg metformin is on the $4 [employer] plan To start monitoring more readings after meals and have his meter checked by nurse educator He will bring his monitor for download on each visit  He needs diabetes education with a dietitian Will consider adding Victoza  through the company  patient assistance program  on his next visit  He will continue walking regularly   [facility] 12/22/2012, 2:37 PM

## 2012-12-30 ENCOUNTER — Ambulatory Visit (INDEPENDENT_AMBULATORY_CARE_PROVIDER_SITE_OTHER): Payer: Medicare Other | Admitting: Internal Medicine

## 2012-12-30 ENCOUNTER — Encounter: Payer: Self-pay | Admitting: Internal Medicine

## 2012-12-30 VITALS — BP 140/84 | HR 80 | Resp 16 | Wt 240.0 lb

## 2012-12-30 DIAGNOSIS — I2510 Atherosclerotic heart disease of native coronary artery without angina pectoris: Secondary | ICD-10-CM

## 2012-12-30 DIAGNOSIS — E1165 Type 2 diabetes mellitus with hyperglycemia: Secondary | ICD-10-CM

## 2012-12-30 DIAGNOSIS — I10 Essential (primary) hypertension: Secondary | ICD-10-CM

## 2012-12-30 DIAGNOSIS — E785 Hyperlipidemia, unspecified: Secondary | ICD-10-CM

## 2012-12-30 MED ORDER — KETOCONAZOLE 2 % EX CREA: 1.0000 "application " | TOPICAL_CREAM | Freq: Every day | CUTANEOUS | Status: DC

## 2012-12-30 MED ORDER — ALOGLIPTIN-PIOGLITAZONE 25-30 MG PO TABS: 1.0000 | ORAL_TABLET | Freq: Every day | ORAL | Status: DC

## 2012-12-30 MED ORDER — CANAGLIFLOZIN 300 MG PO TABS: 300.0000 mg | ORAL_TABLET | Freq: Every day | ORAL | Status: DC

## 2012-12-30 NOTE — Progress Notes (Signed)
Pre visit review using our clinic review tool, if applicable. No additional management support is needed unless otherwise documented below in the visit note. 

## 2012-12-30 NOTE — Assessment & Plan Note (Addendum)
I added samples of Clinton Kirby. He needs to be at goal to resume employment

## 2012-12-30 NOTE — Progress Notes (Signed)
   Subjective:    HPI The patient presents for a follow-up of  chronic hypertension, chronic dyslipidemia, type 2 diabetes, CAD controlled with medicines. He had a DMV well exam and was found to have elevated CBG and BP - off work x 6 wks. C/o meds are too $$$$  SBP 140-150 Fasting CBGs 127-190  BP Readings from Last 3 Encounters:  12/30/12 140/84  12/22/12 118/78  12/07/12 140/94   Wt Readings from Last 3 Encounters:  12/30/12 240 lb (108.863 kg)  12/22/12 239 lb 4.8 oz (108.546 kg)  12/07/12 240 lb 4 oz (108.977 kg)       Review of Systems  Constitutional: Positive for fatigue. Negative for appetite change and unexpected weight change.  HENT: Negative for congestion, nosebleeds, sneezing, sore throat and trouble swallowing.   Eyes: Negative for itching and visual disturbance.  Respiratory: Negative for cough.   Cardiovascular: Negative for chest pain, palpitations and leg swelling.  Gastrointestinal: Negative for nausea, diarrhea, blood in stool and abdominal distention.  Genitourinary: Negative for frequency and hematuria.  Musculoskeletal: Negative for back pain, gait problem, joint swelling and neck pain.  Skin: Negative for rash.  Neurological: Negative for dizziness, tremors, speech difficulty and weakness.  Psychiatric/Behavioral: Negative for sleep disturbance, dysphoric mood and agitation. The patient is not nervous/anxious.        Objective:   Physical Exam  Constitutional: He is oriented to person, place, and time. He appears well-developed.  obese  HENT:  Mouth/Throat: Oropharynx is clear and moist.  Eyes: Conjunctivae are normal. Pupils are equal, round, and reactive to light.  Neck: Normal range of motion. No JVD present. No thyromegaly present.  Cardiovascular: Normal rate, regular rhythm, normal heart sounds and intact distal pulses.  Exam reveals no gallop and no friction rub.   No murmur heard. Pulmonary/Chest: Effort normal and breath sounds  normal. No respiratory distress. He has no wheezes. He has no rales. He exhibits no tenderness.  Abdominal: Soft. Bowel sounds are normal. He exhibits no distension and no mass. There is no tenderness. There is no rebound and no guarding.  Musculoskeletal: Normal range of motion. He exhibits no edema and no tenderness.  Lymphadenopathy:    He has no cervical adenopathy.  Neurological: He is alert and oriented to person, place, and time. He has normal reflexes. No cranial nerve deficit. He exhibits normal muscle tone. Coordination normal.  Skin: Skin is warm and dry. No rash noted.  Psychiatric: He has a normal mood and affect. His behavior is normal. Judgment and thought content normal.   Lab Results  Component Value Date   GLUCOSE 278* 12/20/2012   CHOL 212* 05/18/2011   TRIG 428.0* 05/18/2011   HDL 45.70 05/18/2011   LDLDIRECT 115.4 05/18/2011   ALT 22 08/26/2012   AST 20 08/26/2012   NA 135 12/20/2012   K 4.1 12/20/2012   CL 100 12/20/2012   CREATININE 1.0 12/20/2012   BUN 17 12/20/2012   CO2 25 12/20/2012   TSH 0.97 08/26/2012   HGBA1C 12.0* 12/20/2012           Assessment & Plan:

## 2012-12-31 ENCOUNTER — Encounter: Payer: Self-pay | Admitting: Internal Medicine

## 2012-12-31 NOTE — Assessment & Plan Note (Signed)
Continue with current prescription therapy as reflected on the Med list.  

## 2013-01-13 ENCOUNTER — Encounter: Payer: Self-pay | Admitting: Endocrinology

## 2013-01-13 ENCOUNTER — Ambulatory Visit (INDEPENDENT_AMBULATORY_CARE_PROVIDER_SITE_OTHER): Payer: Medicare Other | Admitting: Endocrinology

## 2013-01-13 VITALS — BP 118/70 | HR 104 | Temp 97.9°F | Resp 12 | Ht 72.0 in | Wt 236.5 lb

## 2013-01-13 DIAGNOSIS — E1165 Type 2 diabetes mellitus with hyperglycemia: Secondary | ICD-10-CM

## 2013-01-13 LAB — BASIC METABOLIC PANEL
CO2: 28 mEq/L (ref 19–32)
Chloride: 102 mEq/L (ref 96–112)
GFR: 113.05 mL/min (ref >60.00)
Glucose, Bld: 105 mg/dL — ABNORMAL HIGH (ref 70–99)
Potassium: 4.1 mEq/L (ref 3.5–5.1)
Sodium: 139 mEq/L (ref 135–145)

## 2013-01-13 NOTE — Progress Notes (Signed)
Patient ID: Clinton Kirby, male   DOB: 09-26-1945, 67 y.o.   MRN: [id number]  Reason for Appointment : Consultation for Type 2 Diabetes  History of Present Illness          Diagnosis: Type 2 diabetes mellitus, date of diagnosis:2004      Past history: At the time of diagnosis he was having frequent urination and his glucose was about 300 He thinks he was put on metformin and glimepiride at that time He believes his blood sugars were fairly well-controlled for the first few years  However review of his records indicate that his A1c has been around 12% since at least 2011 He was started on Invokana on 12/07/12. He thinks his blood sugar prior to this was about 180-190 fasting  Recent history:   His lab glucose readings are usually over 200 even though he thinks his blood sugars are better at home He does tend to have increased thirst and urination especially at night with his history of hyperglycemia  Today he is giving conflicting answers about  what medications he is taking He was told to take metformin 1 g twice a day and try to get the 500 mg covert to the [employer] plan but he did not do so It appears that he is not taking his Amaryl. Probably not getting this because of cost He is taking the Invokana with samples and is taking 300 mg.  He thinks he is taking 500 mg twice a day of metformin also Recently was seen by his PCP and he was given samples of Oseni 25/30 which he is taking He is having difficulty affording his medications because of lack of insurance    Glucose monitoring:  done 1 - 2 times a day         Glucometer: Freestyle.      Blood Glucose readings from recall: readings before breakfast:101-181, usually <130 Hypoglycemia frequency: Never.           Self-care: The diet that the patient has been following is: Avoiding sweets and regular soft drinks     Meals: 3 meals per day.  for breakfast has  eggs, meat, toast but sometimes cereal. Tries to eat salads sometimes        Physical activity: exercise: walking recently         Dietician visit: Most recent: ? A diagnosis ago                Oral hypoglycemic drugs the patient is taking are:  metformin 500 bid, Oseni, Invokana at supper     Side effects from medications have been: Minimal, mild balanitis with Invokana and increased urination Retinal exam: Most recent: 2014  Wt Readings from Last 3 Encounters:  01/13/13 236 lb 8 oz (107.276 kg)  12/30/12 240 lb (108.863 kg)  12/22/12 239 lb 4.8 oz (108.546 kg)     Lab Results  Component Value Date   HGBA1C 12.0* 12/20/2012   HGBA1C 12.0* 08/26/2012   HGBA1C 11.9* 11/13/2011   Lab Results  Component Value Date   CREATININE 1.0 12/20/2012       Medication List       This list is accurate as of: 01/13/13  1:47 PM.  Always use your most recent med list.               Alogliptin-Pioglitazone 25-30 MG Tabs  Commonly known as:  OSENI  Take 1 tablet by mouth daily.     aspirin 325 MG  tablet  Commonly known as:  BAYER ASPIRIN  Take 1 tablet (325 mg total) by mouth daily.     atorvastatin 40 MG tablet  Commonly known as:  LIPITOR  Take 1 tablet (40 mg total) by mouth daily.     Canagliflozin 300 MG Tabs  Commonly known as:  INVOKANA  Take 1 tablet (300 mg total) by mouth daily.     Cholecalciferol 1000 UNITS tablet  Take 1,000 Units by mouth daily.     glimepiride 4 MG tablet  Commonly known as:  AMARYL  TAKE ONE TABLET BY MOUTH EVERY DAY BEFORE  BREAKFAST     ketoconazole 2 % cream  Commonly known as:  NIZORAL  Apply 1 application topically daily.     losartan 100 MG tablet  Commonly known as:  COZAAR  Take 1 tablet (100 mg total) by mouth daily.     lovastatin 20 MG tablet  Commonly known as:  MEVACOR  Take 20 mg by mouth at bedtime.     metFORMIN 1000 MG tablet  Commonly known as:  GLUCOPHAGE  TAKE ONE TABLET BY MOUTH TWICE DAILY WITH  A  MEAL     metoprolol succinate 50 MG 24 hr tablet  Commonly known as:  TOPROL-XL   Take 1 tablet (50 mg total) by mouth daily.     polycarbophil 625 MG tablet  Commonly known as:  FIBERCON  Take 1,250 mg by mouth daily.        Allergies: No Known Allergies  Past Medical History  Diagnosis Date  . CAD (coronary artery disease)   . Diabetes mellitus type II   . HTN (hypertension)   . ED (erectile dysfunction)   . Balanitis   . Hyperlipidemia     Past Surgical History  Procedure Laterality Date  . Umbilical hernia repair      Family History  Problem Relation Age of Onset  . Diabetes Other   . Diabetes Mother   . Cancer Father     ?    Social History:  reports that he has never smoked. He does not have any smokeless tobacco history on file. He reports that he does not drink alcohol or use illicit drugs.    Review of Systems       Lipids: No recent labs available. Has had mild increase in LDL and also significant increase in triglycerides despite taking Lipitor                  Sometimes at night he will have some burning and tingling in his feet, not interfering with sleep, this is not new   LABS:   No visits with results within 1 Week(s) from this visit. Latest known visit with results is:  Office Visit on 12/22/2012  Component Date Value Range Status  . Glucose 12/22/2012 203   Final    Physical Examination:  BP 118/70  Pulse 104  Temp(Src) 97.9 F (36.6 C)  Resp 12  Ht 6' (1.829 m)  Wt 236 lb 8 oz (107.276 kg)  BMI 32.07 kg/m2  SpO2 97%     ASSESSMENT:  Diabetes type 2, uncontrolled   He has had persistently high A1c of 12%  Apparently his blood sugars look better at home but the reading is high in the office again Not clear if he has an accurate monitor at home. 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 Consider extended release if he has any GI intolerance He needs to check more readings after meals Will check his A1c in one month, if it is below 10% he should be able to go back to work Would consider using Victoza on his next visit which should be covered by insurance, also may be able try generic pioglitazone/metformin combination and glimepiride with insurance next month Do not think he can afford Invokana next month  Laquita Harlan 01/13/2013, 1:47 PM

## 2013-01-13 NOTE — Patient Instructions (Addendum)
Metformin 1000 twice daily; Stay on Invokana and  Oseni   Please check blood sugars at least half the time about 2 hours after any meal and as directed on waking up.   Please bring blood sugar monitor to each visit

## 2013-01-16 ENCOUNTER — Telehealth: Payer: Self-pay | Admitting: Endocrinology

## 2013-01-16 NOTE — Telephone Encounter (Signed)
Pt would like lab results  Call back:[phone]  Thank You :)

## 2013-01-16 NOTE — Telephone Encounter (Signed)
Pt is requesting lab results.

## 2013-02-10 ENCOUNTER — Encounter: Payer: Self-pay | Admitting: Endocrinology

## 2013-02-10 ENCOUNTER — Ambulatory Visit (INDEPENDENT_AMBULATORY_CARE_PROVIDER_SITE_OTHER): Payer: Medicare Other | Admitting: Endocrinology

## 2013-02-10 VITALS — BP 150/90 | HR 102 | Temp 98.3°F | Ht 72.0 in | Wt 233.5 lb

## 2013-02-10 DIAGNOSIS — IMO0001 Reserved for inherently not codable concepts without codable children: Secondary | ICD-10-CM | POA: Diagnosis not present

## 2013-02-10 DIAGNOSIS — IMO0002 Reserved for concepts with insufficient information to code with codable children: Secondary | ICD-10-CM

## 2013-02-10 DIAGNOSIS — E1165 Type 2 diabetes mellitus with hyperglycemia: Secondary | ICD-10-CM

## 2013-02-10 LAB — COMPREHENSIVE METABOLIC PANEL
ALT: 21 U/L (ref 0–53)
AST: 18 U/L (ref 0–37)
Albumin: 4.7 g/dL (ref 3.5–5.2)
Alkaline Phosphatase: 54 U/L (ref 39–117)
BUN: 17 mg/dL (ref 6–23)
CALCIUM: 9.9 mg/dL (ref 8.4–10.5)
CHLORIDE: 102 meq/L (ref 96–112)
CO2: 28 mEq/L (ref 19–32)
Creatinine, Ser: 1 mg/dL (ref 0.4–1.5)
GFR: 102.11 mL/min (ref >60.00)
GLUCOSE: 166 mg/dL — AB (ref 70–99)
Potassium: 3.9 mEq/L (ref 3.5–5.1)
SODIUM: 138 meq/L (ref 135–145)
Total Bilirubin: 0.7 mg/dL (ref 0.3–1.2)
Total Protein: 7.8 g/dL (ref 6.0–8.3)

## 2013-02-10 LAB — HEMOGLOBIN A1C: HEMOGLOBIN A1C: 8.9 % — AB (ref 4.6–6.5)

## 2013-02-10 NOTE — Progress Notes (Signed)
Patient ID: Clinton Kirby, male   DOB: 15-Feb-1945, 68 y.o.   MRN: [id number]  Reason for Appointment : Followup of 2 Diabetes  History of Present Illness          Diagnosis: Type 2 diabetes mellitus, date of diagnosis:2004      Prior history: At the time of diagnosis he was having frequent urination and his glucose was about 300 He thinks he was put on metformin and glimepiride at that time He believes his blood sugars were fairly well-controlled for the first few years  However review of his records indicate that his A1c has been around 12% since at least 2011 He was started on Invokana on 12/07/12. Prior to this home blood sugars were about 180-190 fasting.  His lab glucose readings were usually over 200  Recent history:  He is having difficulty affording his medications because of lack of insurance and is currently using samples mostly Also on metformin generic. Because of persistently high readings his metformin was increased to 1 g twice a day in early December and he thinks he is compliant with this. He did have a few high readings after meals right after this but recently his blood sugars are looking fairly good Difficult to analyze his readings since he has not kept an organized record and unable to download the monitor today He also thinks he is taking his Amaryl as directed He is taking the Invokana with samples and is taking 300 mg.  His PCP has given samples of Oseni 25/30 which he is taking    Glucose monitoring:  done 1 - 2 times a day         Glucometer:  FreeStyle     Blood Glucose readings from recall: readings before breakfast:103-266 recently but mostly under 160, 309 about 3 weeks ago Hypoglycemia frequency: Never.          Self-care: The diet that the patient has been following is: Avoiding sweets and regular soft drinks     Meals: 3 meals per day.  for breakfast has  eggs, meat, toast but sometimes cereal. Tries to eat salads sometimes   Physical activity:  exercise: walking  almost daily       Dietician visit: Most recent:  At diagnosis only                Oral hypoglycemic drugs the patient is taking are:  metformin  1000 bid, Oseni, Invokana at supper,? Glimepiride      Side effects from medications have been: Minimal, mild balanitis with Invokana and increased urination Retinal exam: Most recent: 2014  Wt Readings from Last 3 Encounters:  02/10/13 233 lb 8 oz (105.915 kg)  01/13/13 236 lb 8 oz (107.276 kg)  12/30/12 240 lb (108.863 kg)     Lab Results  Component Value Date   HGBA1C 12.0* 12/20/2012   HGBA1C 12.0* 08/26/2012   HGBA1C 11.9* 11/13/2011   Lab Results  Component Value Date   CREATININE 0.9 01/13/2013       Medication List       This list is accurate as of: 02/10/13  2:06 PM.  Always use your most recent med list.               Alogliptin-Pioglitazone 25-30 MG Tabs  Commonly known as:  OSENI  Take 1 tablet by mouth daily.     aspirin 81 MG tablet  Take 81 mg by mouth daily.     atorvastatin 40 MG tablet  Commonly known as:  LIPITOR  Take 1 tablet (40 mg total) by mouth daily.     Canagliflozin 300 MG Tabs  Commonly known as:  INVOKANA  Take 1 tablet (300 mg total) by mouth daily.     Cholecalciferol 1000 UNITS tablet  Take 1,000 Units by mouth daily.     glimepiride 4 MG tablet  Commonly known as:  AMARYL  TAKE ONE TABLET BY MOUTH EVERY DAY BEFORE  BREAKFAST     ketoconazole 2 % cream  Commonly known as:  NIZORAL  Apply 1 application topically daily.     losartan 100 MG tablet  Commonly known as:  COZAAR  Take 1 tablet (100 mg total) by mouth daily.     lovastatin 20 MG tablet  Commonly known as:  MEVACOR  Take 20 mg by mouth at bedtime.     metFORMIN 1000 MG tablet  Commonly known as:  GLUCOPHAGE  TAKE ONE TABLET BY MOUTH TWICE DAILY WITH  A  MEAL     metoprolol succinate 50 MG 24 hr tablet  Commonly known as:  TOPROL-XL  Take 1 tablet (50 mg total) by mouth daily.      polycarbophil 625 MG tablet  Commonly known as:  FIBERCON  Take 1,250 mg by mouth daily.        Allergies: No Known Allergies  Past Medical History  Diagnosis Date  . CAD (coronary artery disease)   . Diabetes mellitus type II   . HTN (hypertension)   . ED (erectile dysfunction)   . Balanitis   . Hyperlipidemia     Past Surgical History  Procedure Laterality Date  . Umbilical hernia repair      Family History  Problem Relation Age of Onset  . Diabetes Other   . Diabetes Mother   . Cancer Father     ?    Social History:  reports that he has never smoked. He does not have any smokeless tobacco history on file. He reports that he does not drink alcohol or use illicit drugs.    Review of Systems       Lipids: No recent labs available. Has had mild increase in LDL and also significant increase in triglycerides despite taking Lipitor                  Sometimes at night he will have some burning and tingling in his feet, not interfering with sleep, this is not new   LABS:   No visits with results within 1 Week(s) from this visit. Latest known visit with results is:  Office Visit on 01/13/2013  Component Date Value Range Status  . POC Glucose 01/13/2013 137* 70 - 99 mg/dl Final  . Sodium 01/13/2013 139  135 - 145 mEq/L Final  . Potassium 01/13/2013 4.1  3.5 - 5.1 mEq/L Final  . Chloride 01/13/2013 102  96 - 112 mEq/L Final  . CO2 01/13/2013 28  19 - 32 mEq/L Final  . Glucose, Bld 01/13/2013 105* 70 - 99 mg/dL Final  . BUN 01/13/2013 16  6 - 23 mg/dL Final  . Creatinine, Ser 01/13/2013 0.9  0.4 - 1.5 mg/dL Final  . Calcium 01/13/2013 10.3  8.4 - 10.5 mg/dL Final  . GFR 01/13/2013 113.05  >60.00 mL/min Final  . Fructosamine 01/13/2013 299* <285 umol/L Final   Comment:  Variations in levels of serum proteins (albumin and immunoglobulins)                          may affect fructosamine results.                               Physical  Examination:  BP 150/90  Pulse 102  Temp(Src) 98.3 F (36.8 C) (Oral)  Ht 6' (1.829 m)  Wt 233 lb 8 oz (105.915 kg)  BMI 31.66 kg/m2  SpO2 96%     ASSESSMENT:  Diabetes type 2, uncontrolled   He has had persistently high A1c of 12% but his blood sugars are significantly better with better compliance with his multidrug regimen ofOseni,Amaryl,  Invokana and increasing his  metformin.  Also has lost a little weight Blood sugars are probably improved with increasing dose of metformin on the last visit and he is compliant with this now However checking blood sugars sporadically and not consistently after meals   PLAN:   He will continue his regimen of Oseni, Invokana, Amaryl  and metformin He needs to check more readings after meals Will check his A1c today and if under 10% he can resume work which will help him get his insurance  Charlise Giovanetti 02/10/2013, 2:06 PM   Addendum: A1c improved at 8.9  Office Visit on 02/10/2013  Component Date Value Range Status  . Hemoglobin A1C 02/10/2013 8.9* 4.6 - 6.5 % Final   Glycemic Control Guidelines for People with Diabetes:Non Diabetic:  <6%Goal of Therapy: <7%Additional Action Suggested:  >8%   . Sodium 02/10/2013 138  135 - 145 mEq/L Final  . Potassium 02/10/2013 3.9  3.5 - 5.1 mEq/L Final  . Chloride 02/10/2013 102  96 - 112 mEq/L Final  . CO2 02/10/2013 28  19 - 32 mEq/L Final  . Glucose, Bld 02/10/2013 166* 70 - 99 mg/dL Final  . BUN 02/10/2013 17  6 - 23 mg/dL Final  . Creatinine, Ser 02/10/2013 1.0  0.4 - 1.5 mg/dL Final  . Total Bilirubin 02/10/2013 0.7  0.3 - 1.2 mg/dL Final  . Alkaline Phosphatase 02/10/2013 54  39 - 117 U/L Final  . AST 02/10/2013 18  0 - 37 U/L Final  . ALT 02/10/2013 21  0 - 53 U/L Final  . Total Protein 02/10/2013 7.8  6.0 - 8.3 g/dL Final  . Albumin 02/10/2013 4.7  3.5 - 5.2 g/dL Final  . Calcium 02/10/2013 9.9  8.4 - 10.5 mg/dL Final  . GFR 02/10/2013 102.11  >60.00 mL/min Final

## 2013-02-12 MED ORDER — GLUCOSE BLOOD VI STRP
ORAL_STRIP | Status: DC
Start: 2013-02-12 — End: 2013-02-27

## 2013-02-12 NOTE — Patient Instructions (Signed)
Please check blood sugars at least half the time about 2 hours after any meal and as directed on waking up. Please bring blood sugar monitor to each visit

## 2013-02-27 ENCOUNTER — Other Ambulatory Visit: Payer: Self-pay | Admitting: *Deleted

## 2013-02-27 MED ORDER — GLUCOSE BLOOD VI STRP: ORAL_STRIP | Status: DC

## 2013-03-16 DIAGNOSIS — L600 Ingrowing nail: Secondary | ICD-10-CM | POA: Diagnosis not present

## 2013-04-10 ENCOUNTER — Ambulatory Visit: Payer: Medicare Other | Admitting: Endocrinology

## 2013-04-10 ENCOUNTER — Encounter: Payer: Self-pay | Admitting: *Deleted

## 2013-04-10 ENCOUNTER — Other Ambulatory Visit: Payer: Medicare Other

## 2013-04-14 ENCOUNTER — Other Ambulatory Visit: Payer: Medicare Other

## 2013-05-03 ENCOUNTER — Ambulatory Visit: Payer: Medicare Other | Admitting: Endocrinology

## 2013-05-22 DIAGNOSIS — E1149 Type 2 diabetes mellitus with other diabetic neurological complication: Secondary | ICD-10-CM | POA: Diagnosis not present

## 2013-05-22 DIAGNOSIS — E1142 Type 2 diabetes mellitus with diabetic polyneuropathy: Secondary | ICD-10-CM | POA: Diagnosis not present

## 2013-08-29 ENCOUNTER — Ambulatory Visit (INDEPENDENT_AMBULATORY_CARE_PROVIDER_SITE_OTHER): Payer: Medicare Other | Admitting: Internal Medicine

## 2013-08-29 ENCOUNTER — Encounter: Payer: Self-pay | Admitting: Internal Medicine

## 2013-08-29 ENCOUNTER — Other Ambulatory Visit (INDEPENDENT_AMBULATORY_CARE_PROVIDER_SITE_OTHER): Payer: Medicare Other

## 2013-08-29 VITALS — BP 132/80 | HR 97 | Temp 98.3°F | Ht 72.0 in | Wt 226.0 lb

## 2013-08-29 DIAGNOSIS — IMO0002 Reserved for concepts with insufficient information to code with codable children: Secondary | ICD-10-CM

## 2013-08-29 DIAGNOSIS — IMO0001 Reserved for inherently not codable concepts without codable children: Secondary | ICD-10-CM

## 2013-08-29 DIAGNOSIS — I10 Essential (primary) hypertension: Secondary | ICD-10-CM

## 2013-08-29 DIAGNOSIS — E1165 Type 2 diabetes mellitus with hyperglycemia: Secondary | ICD-10-CM

## 2013-08-29 LAB — BASIC METABOLIC PANEL
BUN: 16 mg/dL (ref 6–23)
CHLORIDE: 97 meq/L (ref 96–112)
CO2: 27 mEq/L (ref 19–32)
CREATININE: 0.9 mg/dL (ref 0.4–1.5)
Calcium: 9.8 mg/dL (ref 8.4–10.5)
GFR: 103.19 mL/min (ref >60.00)
Glucose, Bld: 309 mg/dL — ABNORMAL HIGH (ref 70–99)
Potassium: 4.3 mEq/L (ref 3.5–5.1)
Sodium: 133 mEq/L — ABNORMAL LOW (ref 135–145)

## 2013-08-29 LAB — LIPID PANEL
CHOL/HDL RATIO: 5
Cholesterol: 248 mg/dL — ABNORMAL HIGH (ref 0–200)
HDL: 45.6 mg/dL (ref >39.00)
LDL Cholesterol: 153 mg/dL — ABNORMAL HIGH (ref 0–99)
NonHDL: 202.4
Triglycerides: 249 mg/dL — ABNORMAL HIGH (ref 0.0–149.0)
VLDL: 49.8 mg/dL — ABNORMAL HIGH (ref 0.0–40.0)

## 2013-08-29 LAB — HEPATIC FUNCTION PANEL
ALK PHOS: 69 U/L (ref 39–117)
ALT: 18 U/L (ref 0–53)
AST: 15 U/L (ref 0–37)
Albumin: 4.3 g/dL (ref 3.5–5.2)
BILIRUBIN DIRECT: 0.1 mg/dL (ref 0.0–0.3)
TOTAL PROTEIN: 7.5 g/dL (ref 6.0–8.3)
Total Bilirubin: 1.2 mg/dL (ref 0.2–1.2)

## 2013-08-29 LAB — HEMOGLOBIN A1C: Hgb A1c MFr Bld: 13.6 % — ABNORMAL HIGH (ref 4.6–6.5)

## 2013-08-29 MED ORDER — DULOXETINE HCL 60 MG PO CPEP: 60.0000 mg | ORAL_CAPSULE | Freq: Every day | ORAL | Status: DC

## 2013-08-29 MED ORDER — LOSARTAN POTASSIUM 100 MG PO TABS: 100.0000 mg | ORAL_TABLET | Freq: Every day | ORAL | Status: DC

## 2013-08-29 MED ORDER — METFORMIN HCL 1000 MG PO TABS: ORAL_TABLET | ORAL | Status: DC

## 2013-08-29 MED ORDER — METOPROLOL SUCCINATE ER 50 MG PO TB24: 50.0000 mg | ORAL_TABLET | Freq: Every day | ORAL | Status: DC

## 2013-08-29 MED ORDER — ATORVASTATIN CALCIUM 40 MG PO TABS: 40.0000 mg | ORAL_TABLET | Freq: Every day | ORAL | Status: DC

## 2013-08-29 MED ORDER — GLIMEPIRIDE 4 MG PO TABS: ORAL_TABLET | ORAL | Status: DC

## 2013-08-29 NOTE — Assessment & Plan Note (Addendum)
7/15 off all meds due to cost See meds - re-started  Labs Dr Dwyane Dee in 2 wks Risks associated with treatment noncompliance were discussed. Compliance was encouraged.

## 2013-08-29 NOTE — Assessment & Plan Note (Signed)
7/15 off all meds due to cost See meds - re-started  Labs

## 2013-08-29 NOTE — Progress Notes (Signed)
Pre visit review using our clinic review tool, if applicable. No additional management support is needed unless otherwise documented below in the visit note. 

## 2013-08-29 NOTE — Progress Notes (Signed)
   Subjective:    HPI The patient presents for a follow-up of  chronic hypertension, chronic dyslipidemia, type 2 diabetes, CAD controlled with medicines. He had a DMV well exam and was found to have elevated CBG and BP - off work x 6 wks.   C/o meds are too $$$$ - he stopped DM and BP meds and started to take herbal meds (garlic)...  SBP 140-150 Fasting CBGs 127-190  BP Readings from Last 3 Encounters:  08/29/13 132/80  02/10/13 150/90  01/13/13 118/70   Wt Readings from Last 3 Encounters:  08/29/13 226 lb (102.513 kg)  02/10/13 233 lb 8 oz (105.915 kg)  01/13/13 236 lb 8 oz (107.276 kg)    Review of Systems  Constitutional: Positive for fatigue. Negative for appetite change and unexpected weight change.  HENT: Negative for congestion, nosebleeds, sneezing, sore throat and trouble swallowing.   Eyes: Negative for itching and visual disturbance.  Respiratory: Negative for cough.   Cardiovascular: Negative for chest pain, palpitations and leg swelling.  Gastrointestinal: Negative for nausea, diarrhea, blood in stool and abdominal distention.  Genitourinary: Negative for frequency and hematuria.  Musculoskeletal: Negative for back pain, gait problem, joint swelling and neck pain.  Skin: Negative for rash.  Neurological: Negative for dizziness, tremors, speech difficulty and weakness.  Psychiatric/Behavioral: Negative for sleep disturbance, dysphoric mood and agitation. The patient is not nervous/anxious.        Objective:   Physical Exam  Constitutional: He is oriented to person, place, and time. He appears well-developed.  obese  HENT:  Mouth/Throat: Oropharynx is clear and moist.  Eyes: Conjunctivae are normal. Pupils are equal, round, and reactive to light.  Neck: Normal range of motion. No JVD present. No thyromegaly present.  Cardiovascular: Normal rate, regular rhythm, normal heart sounds and intact distal pulses.  Exam reveals no gallop and no friction rub.   No  murmur heard. Pulmonary/Chest: Effort normal and breath sounds normal. No respiratory distress. He has no wheezes. He has no rales. He exhibits no tenderness.  Abdominal: Soft. Bowel sounds are normal. He exhibits no distension and no mass. There is no tenderness. There is no rebound and no guarding.  Musculoskeletal: Normal range of motion. He exhibits no edema and no tenderness.  Lymphadenopathy:    He has no cervical adenopathy.  Neurological: He is alert and oriented to person, place, and time. He has normal reflexes. No cranial nerve deficit. He exhibits normal muscle tone. Coordination normal.  Skin: Skin is warm and dry. No rash noted.  Psychiatric: He has a normal mood and affect. His behavior is normal. Judgment and thought content normal.   Lab Results  Component Value Date   GLUCOSE 166* 02/10/2013   CHOL 212* 05/18/2011   TRIG 428.0* 05/18/2011   HDL 45.70 05/18/2011   LDLDIRECT 115.4 05/18/2011   ALT 21 02/10/2013   AST 18 02/10/2013   NA 138 02/10/2013   K 3.9 02/10/2013   CL 102 02/10/2013   CREATININE 1.0 02/10/2013   BUN 17 02/10/2013   CO2 28 02/10/2013   TSH 0.97 08/26/2012   HGBA1C 8.9* 02/10/2013           Assessment & Plan:

## 2013-09-15 ENCOUNTER — Encounter: Payer: Self-pay | Admitting: Endocrinology

## 2013-09-15 ENCOUNTER — Ambulatory Visit (INDEPENDENT_AMBULATORY_CARE_PROVIDER_SITE_OTHER): Payer: Medicare Other | Admitting: Endocrinology

## 2013-09-15 VITALS — BP 134/94 | HR 93 | Temp 98.6°F | Resp 16 | Ht 72.0 in | Wt 231.6 lb

## 2013-09-15 DIAGNOSIS — IMO0001 Reserved for inherently not codable concepts without codable children: Secondary | ICD-10-CM

## 2013-09-15 DIAGNOSIS — E1165 Type 2 diabetes mellitus with hyperglycemia: Secondary | ICD-10-CM

## 2013-09-15 DIAGNOSIS — E785 Hyperlipidemia, unspecified: Secondary | ICD-10-CM | POA: Diagnosis not present

## 2013-09-15 DIAGNOSIS — IMO0002 Reserved for concepts with insufficient information to code with codable children: Secondary | ICD-10-CM

## 2013-09-15 LAB — GLUCOSE, POCT (MANUAL RESULT ENTRY): POC Glucose: 191 mg/dl — AB (ref 70–99)

## 2013-09-15 MED ORDER — ATORVASTATIN CALCIUM 10 MG PO TABS: 40.0000 mg | ORAL_TABLET | Freq: Every day | ORAL | Status: DC

## 2013-09-15 MED ORDER — CANAGLIFLOZIN 300 MG PO TABS: 300.0000 mg | ORAL_TABLET | Freq: Every day | ORAL | Status: DC

## 2013-09-15 NOTE — Patient Instructions (Addendum)
Invokana to be taken in am daily  Please check blood sugars at least half the time about 2 hours after any meal and 3 times per week on waking up. Please bring blood sugar monitor to each visit  Low fat diet

## 2013-09-15 NOTE — Progress Notes (Signed)
Patient ID: Clinton Kirby, male   DOB: 17-Jan-1946, 68 y.o.   MRN: [id number]   Reason for Appointment : Followup of 2 Diabetes  History of Present Illness          Diagnosis: Type 2 diabetes mellitus, date of diagnosis:2004      Prior history: At the time of diagnosis he was having frequent urination and his glucose was about 300 He thinks he was put on metformin and glimepiride at that time He believes his blood sugars were fairly well-controlled for the first few years  However review of his records indicate that his A1c has been around 12% since at least 2011 He was started on Invokana on 12/07/12. Prior to this home blood sugars were about 180-190 fasting.   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 Difficult to evaluate his home monitoring as he is checking infrequently and not keeping a record Lab glucose last month was then Does not think his blood sugars have been high at home even though his A1c is now over 13% He ran out of his Invokana a few weeks ago He  thinks he is taking his Amaryl as directed    Glucose monitoring:  sporadic         Glucometer:  ? Blood Glucose readings from recall: <200  Hypoglycemia frequency: Never.          Self-care: The diet that the patient has been following is: Avoiding sweets and regular soft drinks     Meals: 3 meals per day.  for breakfast has  eggs, meat, toast but sometimes cereal. Tries to eat salads sometimes   Physical activity: exercise: walking  almost daily       Dietician visit: Most recent:  At diagnosis only                Oral hypoglycemic drugs the  patient is taking are:  metformin  1000 bid, Oseni, Invokana at supper,? Glimepiride      Side effects from medications have been: Minimal, mild balanitis with Invokana and increased urination Retinal exam: Most recent: 2014  Wt Readings from Last 3 Encounters:  09/15/13 231 lb 9.6 oz (105.053 kg)  08/29/13 226 lb (102.513 kg)  02/10/13 233 lb 8 oz (105.915 kg)     Lab Results  Component Value Date   HGBA1C 13.6* 08/29/2013   HGBA1C 8.9* 02/10/2013   HGBA1C 12.0* 12/20/2012   Lab Results  Component Value Date   LDLCALC 153* 08/29/2013   CREATININE 0.9 08/29/2013       Medication List       This list is accurate as of: 09/15/13  4:27 PM.  Always use your most recent med list.               aspirin 325 MG tablet  Take 325 mg by mouth daily.     atorvastatin 40 MG tablet  Commonly known as:  LIPITOR  Take 1 tablet (40 mg total) by mouth daily.     DULoxetine 60 MG capsule  Commonly known as:  CYMBALTA  Take 1 capsule (60 mg total)  by mouth daily.     glimepiride 4 MG tablet  Commonly known as:  AMARYL  TAKE ONE TABLET BY MOUTH EVERY DAY BEFORE  BREAKFAST     losartan 100 MG tablet  Commonly known as:  COZAAR  Take 1 tablet (100 mg total) by mouth daily.     metFORMIN 1000 MG tablet  Commonly known as:  GLUCOPHAGE  TAKE ONE TABLET BY MOUTH TWICE DAILY WITH  A  MEAL     metoprolol succinate 50 MG 24 hr tablet  Commonly known as:  TOPROL-XL  Take 1 tablet (50 mg total) by mouth daily.     ODORLESS GARLIC PO  Take by mouth.        Allergies: No Known Allergies  Past Medical History  Diagnosis Date  . CAD (coronary artery disease)   . Diabetes mellitus type II   . HTN (hypertension)   . ED (erectile dysfunction)   . Balanitis   . Hyperlipidemia     Past Surgical History  Procedure Laterality Date  . Umbilical hernia repair      Family History  Problem Relation Age of Onset  . Diabetes Other   . Diabetes Mother   . Cancer Father     ?     Social History:  reports that he has never smoked. He does not have any smokeless tobacco history on file. He reports that he does not drink alcohol or use illicit drugs.    Review of Systems       Lipids:  Has had  increase in LDL and also increase in triglycerides despite taking Lipitor     Lab Results  Component Value Date   CHOL 248* 08/29/2013   HDL 45.60 08/29/2013   LDLCALC 153* 08/29/2013   LDLDIRECT 115.4 05/18/2011   TRIG 249.0* 08/29/2013   CHOLHDL 5 08/29/2013                  He says at night he was having some burning and tingling in his feet, was given Lyrica by his podiatrist and this is effective at the bedtime dose of 50 mg   LABS:   Office Visit on 09/15/2013  Component Date Value Ref Range Status  . POC Glucose 09/15/2013 191* 70 - 99 mg/dl Final    Physical Examination:  BP 134/94  Pulse 93  Temp(Src) 98.6 F (37 C)  Resp 16  Ht 6' (1.829 m)  Wt 231 lb 9.6 oz (105.053 kg)  BMI 31.40 kg/m2  SpO2 96%     ASSESSMENT:  Diabetes type 2, uncontrolled   He has had persistently high A1c which is now higher than before probably because of his noncompliance with medications Also not clear if he is watching his diet consistently His home readings are lower than expected for his A1c He will be able to get his prescriptions now and he believes his blood sugars were much better when he was taking Invokana regularly Again checking blood sugars sporadically and not consistently after meals  Hyperlipidemia and hypertension: Followed by PCP, currently not well controlled   PLAN:   He will resume Invokana, prescription given He will continue his regimen of  Amaryl  and metformin He needs to check more readings after meals and bring his monitor for review To try and watch his diet better with limiting high fat meals, simple sugars and moderating on carbohydrates Discussed timing and targets of blood sugars He may well need insulin if blood sugar not well  controlled. Also a candidate for Victoza  Encouraged regular followup  Counseling time over 50% of today's 25 minute visit  Clinton Kirby 09/15/2013, 4:27 PM

## 2013-10-11 ENCOUNTER — Other Ambulatory Visit: Payer: Medicare Other

## 2013-10-13 ENCOUNTER — Ambulatory Visit: Payer: Medicare Other | Admitting: Endocrinology

## 2013-11-27 ENCOUNTER — Telehealth: Payer: Self-pay | Admitting: Cardiology

## 2013-11-27 ENCOUNTER — Other Ambulatory Visit (INDEPENDENT_AMBULATORY_CARE_PROVIDER_SITE_OTHER): Payer: Medicare Other

## 2013-11-27 DIAGNOSIS — E1165 Type 2 diabetes mellitus with hyperglycemia: Secondary | ICD-10-CM

## 2013-11-27 DIAGNOSIS — IMO0002 Reserved for concepts with insufficient information to code with codable children: Secondary | ICD-10-CM

## 2013-11-27 LAB — BASIC METABOLIC PANEL
BUN: 22 mg/dL (ref 6–23)
CO2: 23 mEq/L (ref 19–32)
Calcium: 9.5 mg/dL (ref 8.4–10.5)
Chloride: 103 mEq/L (ref 96–112)
Creatinine, Ser: 0.9 mg/dL (ref 0.4–1.5)
GFR: 112.75 mL/min (ref >60.00)
GLUCOSE: 181 mg/dL — AB (ref 70–99)
Potassium: 4.1 mEq/L (ref 3.5–5.1)
Sodium: 140 mEq/L (ref 135–145)

## 2013-11-27 LAB — HEMOGLOBIN A1C: Hgb A1c MFr Bld: 11.5 % — ABNORMAL HIGH (ref 4.6–6.5)

## 2013-11-27 NOTE — Telephone Encounter (Signed)
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 Please fax to [facility]  [phone].

## 2013-11-27 NOTE — Telephone Encounter (Signed)
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

## 2013-11-28 ENCOUNTER — Telehealth: Payer: Self-pay | Admitting: Cardiology

## 2013-11-28 NOTE — Telephone Encounter (Signed)
Pt wants to know if anybody called today about his lab results?

## 2013-11-28 NOTE — Telephone Encounter (Signed)
Pt. Informed of his lab results

## 2013-12-07 ENCOUNTER — Encounter: Payer: Self-pay | Admitting: Cardiology

## 2013-12-07 ENCOUNTER — Ambulatory Visit (INDEPENDENT_AMBULATORY_CARE_PROVIDER_SITE_OTHER): Payer: Medicare Other | Admitting: Cardiology

## 2013-12-07 VITALS — BP 137/82 | HR 83 | Ht 72.0 in | Wt 235.0 lb

## 2013-12-07 DIAGNOSIS — IMO0002 Reserved for concepts with insufficient information to code with codable children: Secondary | ICD-10-CM

## 2013-12-07 DIAGNOSIS — I10 Essential (primary) hypertension: Secondary | ICD-10-CM | POA: Diagnosis not present

## 2013-12-07 DIAGNOSIS — I252 Old myocardial infarction: Secondary | ICD-10-CM | POA: Diagnosis not present

## 2013-12-07 DIAGNOSIS — I2510 Atherosclerotic heart disease of native coronary artery without angina pectoris: Secondary | ICD-10-CM | POA: Diagnosis not present

## 2013-12-07 DIAGNOSIS — E1165 Type 2 diabetes mellitus with hyperglycemia: Secondary | ICD-10-CM

## 2013-12-07 DIAGNOSIS — E785 Hyperlipidemia, unspecified: Secondary | ICD-10-CM | POA: Diagnosis not present

## 2013-12-07 HISTORY — DX: Old myocardial infarction: I25.2

## 2013-12-07 MED ORDER — GLIMEPIRIDE 4 MG PO TABS: 4.0000 mg | ORAL_TABLET | Freq: Every day | ORAL | Status: DC

## 2013-12-07 MED ORDER — CANAGLIFLOZIN 300 MG PO TABS: 300.0000 mg | ORAL_TABLET | Freq: Every day | ORAL | Status: DC

## 2013-12-07 NOTE — Patient Instructions (Signed)
Continue your current therapy  I will see you in one year   

## 2013-12-07 NOTE — Progress Notes (Signed)
Clinton Kirby Date of Birth: April 13, 1945 Medical Record #[id number]  History of Present Illness: Mr. Sliger is seen for yearly follow up. He is a 68 year old black male with known history of coronary disease. He is status post inferior myocardial infarction in February of 2004 while in [place]. This was treated with a drug-eluting stent to the right coronary. In 2005 he had some abnormality on a stress test and underwent repeat cardiac catheterization. This showed the stent to be widely patent and he had no other significant disease. His last stress test was in July 2014 and is noted below.  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 He currently denies any chest pain, shortness of breath, or palpitations. He has had no edema. He has a history of poorly controlled diabetes mellitus type 2. He also has a history of hyperlipidemia and hypertension.  Current Outpatient Prescriptions on File Prior to Visit  Medication Sig Dispense Refill  . aspirin 325 MG tablet Take 325 mg by mouth daily.      [name] atorvastatin (LIPITOR) 10 MG tablet Take 4 tablets (40 mg total) by mouth daily.  30 tablet  1  . DULoxetine (CYMBALTA) 60 MG capsule Take 1 capsule (60 mg total) by mouth daily.  30 capsule  5  . losartan (COZAAR) 100 MG tablet Take 1 tablet (100 mg total) by mouth daily.  30 tablet  11  . metFORMIN (GLUCOPHAGE) 1000 MG tablet TAKE ONE TABLET BY MOUTH TWICE DAILY WITH  A  MEAL  60 tablet  3  . metoprolol succinate (TOPROL-XL) 50 MG 24 hr tablet Take 1 tablet (50 mg total) by mouth daily.  30 tablet  11  . ODORLESS GARLIC PO Take by mouth.      . pregabalin (LYRICA) 50 MG capsule Take 50 mg by mouth daily.       No current facility-administered medications on file prior to visit.    No Known Allergies  Past Medical History  Diagnosis Date  . CAD (coronary artery disease)   . Diabetes mellitus type II   . HTN (hypertension)   . ED (erectile dysfunction)   . Balanitis     . Hyperlipidemia   . Old MI (myocardial infarction) 12/07/2013    Past Surgical History  Procedure Laterality Date  . Umbilical hernia repair      History  Smoking status  . Never Smoker   Smokeless tobacco  . Not on file    History  Alcohol Use No    Family History  Problem Relation Age of Onset  . Diabetes Other   . Diabetes Mother   . Cancer Father     ?    Review of Systems: The review of systems is positive for a sedentary lifestyle. 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 All other systems were reviewed and are negative.  Physical Exam: BP 137/82  Pulse 83  Ht 6' (1.829 m)  Wt 235 lb (106.595 kg)  BMI 31.86 kg/m2 GENERAL:  Well appearing, obese BM in NAD HEENT:  PERRL, EOMI, sclera are clear. Oropharynx is clear. NECK:  No jugular venous distention, carotid upstroke brisk and symmetric, no bruits, no thyromegaly or adenopathy LUNGS:  Clear to auscultation bilaterally CHEST:  Unremarkable HEART:  RRR,  PMI not displaced or sustained,S1 and S2 within normal limits, no S3, no S4: no clicks, no rubs, no murmurs ABD:  Soft, nontender. BS +, no masses or bruits. No  hepatomegaly, no splenomegaly EXT:  2 + pulses throughout, no edema, no cyanosis no clubbing SKIN:  Warm and dry.  No rashes NEURO:  Alert and oriented x 3. Cranial nerves II through XII intact. PSYCH:  Cognitively intact    LABORATORY DATA: ECG demonstrates normal sinus rhythm with an old inferior infarction. LAD. I have personally reviewed and interpreted this study.  Lab Results  Component Value Date   GLUCOSE 181* 11/27/2013   CHOL 248* 08/29/2013   TRIG 249.0* 08/29/2013   HDL 45.60 08/29/2013   LDLDIRECT 115.4 05/18/2011   LDLCALC 153* 08/29/2013   ALT 18 08/29/2013   AST 15 08/29/2013   NA 140 11/27/2013   K 4.1 11/27/2013   CL 103 11/27/2013   CREATININE 0.9 11/27/2013   BUN 22 11/27/2013   CO2 23 11/27/2013   TSH 0.97 08/26/2012   HGBA1C 11.5* 11/27/2013   Cardiology  Nuclear Med Study  Clinton Kirby is a 68 y.o. male MRN : [id number] DOB: Sep 10, 1945  Procedure Date: 08/30/2012  Nuclear Med Background  Indication for Stress Test: Evaluation for Ischemia, PTCA/Stent Patency, and DOT Clearance  History: 2004 MI- IWMI-Angioplasty/Stents: RCA, ECHO: Nl EF, 2005 Heart Cath: patent Stent no other sig. Dz 2007 MPS: inferobasal infarct with mild peri-infarct ischemia EF: 56%  Cardiac Risk Factors: Hypertension, Lipids and NIDDM  Symptoms: n/a  Nuclear Pre-Procedure  Caffeine/Decaff Intake: None> 12 hrs  NPO After: 7:30am   Lungs: clear  O2 Sat: 96% on room air.  IV 0.9% NS with Angio Cath: 22g   IV Site: R Antecubital x 1, tolerated well  IV Started by: Irven Baltimore, RN   Chest Size (in): 46  Cup Size: n/a   Height: 5\' 9"  (1.753 m)  Weight: 238 lb (107.956 kg)   BMI: Body mass index is 35.13 kg/(m^2).  Tech Comments: Ran out Metoprolol 3 or 4 months, refilled recently per patient, but no restarted yet.   Nuclear Med Study  1 or 2 day study: 1 day  Stress Test Type: Stress   Reading MD: Kirk Ruths, MD  Order Authorizing Provider: Rian Busche [place], MD   Resting Radionuclide: Technetium 39m Sestamibi  Resting Radionuclide Dose: 11.0 mCi   Stress Radionuclide: Technetium 59m Sestamibi  Stress Radionuclide Dose: 33.0 mCi   Stress Protocol  Rest HR: 90  Stress HR: 162   Rest BP: 170/100  Stress BP: 220/83   Exercise Time (min): 6:00  METS: 7.0   Predicted Max HR: 155 bpm  % Max HR: 104.52 bpm  Rate Pressure Product: 33354  Dose of Adenosine (mg): n/a  Dose of Lexiscan: n/a mg   Dose of Atropine (mg): n/a  Dose of Dobutamine: n/a mcg/kg/min (at max HR)   Stress Test Technologist: Perrin Maltese, EMT-P  Nuclear Technologist: Vedia Pereyra, CNMT   Rest Procedure: Myocardial perfusion imaging was performed at rest 45 minutes following the intravenous administration of Technetium 48m Sestamibi.  Rest ECG: NSR, IRBBB, inferior MI.  Stress Procedure: The patient  exercised on the treadmill utilizing the Bruce Protocol for 6:00 minutes. The patient stopped due to fatigue and denied any chest pain. Technetium 80m Sestamibi was injected at peak exercise and myocardial perfusion imaging was performed after a brief delay.  Stress ECG: No significant ST segment change suggestive of ischemia.  QPS  Raw Data Images: Acquisition technically good; normal left ventricular size.  Stress Images: There is decreased uptake in the inferior wall.  Rest Images: There is decreased uptake in the inferior wall.  Subtraction (SDS): There is a fixed defect that is most consistent with a previous infarction.  Transient Ischemic Dilatation (Normal <1.22): NA  Lung/Heart Ratio (Normal <0.45): 0.42  Quantitative Gated Spect Images  QGS EDV: 87 ml  QGS ESV: 49 ml  Impression  Exercise Capacity: Fair exercise capacity.  BP Response: Hypertensive blood pressure response.  Clinical Symptoms: There is chest pain.  ECG Impression: No significant ST segment change suggestive of ischemia.  Comparison with Prior Nuclear Study: No images to compare  Overall Impression: Low risk stress nuclear study with a moderate size, severe intensity, fixed inferior defect consistent with prior infarct; no significant ischemia.  LV Ejection Fraction: 43%. LV Wall Motion: Wall motion appears to be normal; LV function appears better than calculated EF; suggest echo to better assess.  Kirk Ruths      Assessment / Plan: 1. Coronary disease status post inferior myocardial infarction February 2004 treated with direct stenting of the right coronary with a drug-eluting stent. Patient is asymptomatic. Stress Myoview from July 2014 showed inferior scar without ischemia. EF 43%. Continue medical therapy with ASA, statin, and metoprolol. 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 I will follow up in one year.  2. Hypertension- well controlled.  3. Diabetes mellitus, poorly  controlled. Managed by Dr. Dwyane Dee.  4. Hyperlipidemia. Poorly controlled. Continue high dose statin. I expect his triglycerides will improve with improved glycemic control.

## 2013-12-08 ENCOUNTER — Ambulatory Visit: Payer: Medicare Other | Admitting: Endocrinology

## 2013-12-12 ENCOUNTER — Encounter: Payer: Self-pay | Admitting: Endocrinology

## 2013-12-12 ENCOUNTER — Ambulatory Visit (INDEPENDENT_AMBULATORY_CARE_PROVIDER_SITE_OTHER): Payer: Medicare Other | Admitting: Endocrinology

## 2013-12-12 VITALS — BP 140/90 | HR 100 | Temp 97.9°F | Resp 16 | Ht 72.0 in | Wt 234.4 lb

## 2013-12-12 DIAGNOSIS — E1165 Type 2 diabetes mellitus with hyperglycemia: Secondary | ICD-10-CM

## 2013-12-12 DIAGNOSIS — IMO0002 Reserved for concepts with insufficient information to code with codable children: Secondary | ICD-10-CM

## 2013-12-12 DIAGNOSIS — I2510 Atherosclerotic heart disease of native coronary artery without angina pectoris: Secondary | ICD-10-CM | POA: Diagnosis not present

## 2013-12-12 DIAGNOSIS — I10 Essential (primary) hypertension: Secondary | ICD-10-CM | POA: Diagnosis not present

## 2013-12-12 LAB — GLUCOSE, POCT (MANUAL RESULT ENTRY): POC Glucose: 115 mg/dl — AB (ref 70–99)

## 2013-12-12 MED ORDER — GLUCOSE BLOOD VI STRP: ORAL_STRIP | Status: DC

## 2013-12-12 NOTE — Progress Notes (Signed)
Patient ID: Clinton Kirby, male   DOB: 08-Jan-1946, 67 y.o.   MRN: [id number]   Reason for Appointment : Followup of 2 Diabetes  History of Present Illness          Diagnosis: Type 2 diabetes mellitus, date of diagnosis:2004      Prior history: At the time of diagnosis he was having frequent urination and his glucose was about 300 He thinks he was put on metformin and glimepiride at that time He believes his blood sugars were fairly well-controlled for the first few years  However review of his records indicate that his A1c has been around 12% since at least 2011 He was started on Invokana on 12/07/12. Prior to this home blood sugars were about 180-190 fasting.   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 Also he is unable to get back to his work because of criterion from his employer to have A1c below 10% before allowing him to work He had been on metformin and Amaryl However he has been able to get Invokana since his last visit and blood sugars are reportedly better A1c done about 2 months after starting Invokana was still high at 11.5% Difficult to evaluate his home monitoring as he is checking infrequently and not keeping a record, he claims to be taking readings with a brand-new monitor; however he is not getting a prescription for this and has not been able to afford frequent monitoring He claims his blood sugars have been fairly good and only occasionally high Lab glucose last month was 181 nonfasting Has been unable to lose any weight even though he tries to walk    Glucose monitoring:  sporadic         Glucometer:  ? One Touch Blood Glucose readings from recall: 90-180, usually not high  Hypoglycemia frequency: Never.          Self-care: The diet that the patient has been following is: Avoiding  sweets and regular soft drinks     Meals: 3 meals per day.  for breakfast has  eggs, meat, toast but sometimes cereal. Tries to eat salads sometimes   Physical activity: exercise: walking almost daily       Dietician visit: Most recent:  At diagnosis only                Oral hypoglycemic drugs the patient is taking are:  metformin  1000 bid, Invokana in a.m.;4 mgGlimepiride      Side effects from medications have been: Minimal, mild balanitis with Invokana and increased urination Retinal exam: Most recent: 2014  Wt Readings from Last 3 Encounters:  12/12/13 234 lb 6.4 oz (106.323 kg)  12/07/13 235 lb (106.595 kg)  09/15/13 231 lb 9.6 oz (105.053 kg)     Lab Results  Component Value Date   HGBA1C 11.5* 11/27/2013   HGBA1C 13.6* 08/29/2013   HGBA1C 8.9* 02/10/2013   Lab Results  Component Value Date   LDLCALC 153* 08/29/2013   CREATININE 0.9 11/27/2013       Medication List       This list is accurate as of: 12/12/13 11:05 AM.  Always use your most recent med list.               aspirin 325 MG tablet  Take 325 mg by mouth daily.  atorvastatin 10 MG tablet  Commonly known as:  LIPITOR  Take 4 tablets (40 mg total) by mouth daily.     canagliflozin 300 MG Tabs tablet  Commonly known as:  INVOKANA  Take 300 mg by mouth daily before breakfast.     DULoxetine 60 MG capsule  Commonly known as:  CYMBALTA  Take 1 capsule (60 mg total) by mouth daily.     glimepiride 4 MG tablet  Commonly known as:  AMARYL  Take 1 tablet (4 mg total) by mouth daily with breakfast.     losartan 100 MG tablet  Commonly known as:  COZAAR  Take 1 tablet (100 mg total) by mouth daily.     metFORMIN 1000 MG tablet  Commonly known as:  GLUCOPHAGE  TAKE ONE TABLET BY MOUTH TWICE DAILY WITH  A  MEAL     metoprolol succinate 50 MG 24 hr tablet  Commonly known as:  TOPROL-XL  Take 1 tablet (50 mg total) by mouth daily.     ODORLESS GARLIC PO  Take by mouth.     pregabalin 50 MG  capsule  Commonly known as:  LYRICA  Take 50 mg by mouth daily.        Allergies: No Known Allergies  Past Medical History  Diagnosis Date  . CAD (coronary artery disease)   . Diabetes mellitus type II   . HTN (hypertension)   . ED (erectile dysfunction)   . Balanitis   . Hyperlipidemia   . Old MI (myocardial infarction) 12/07/2013    Past Surgical History  Procedure Laterality Date  . Umbilical hernia repair      Family History  Problem Relation Age of Onset  . Diabetes Other   . Diabetes Mother   . Cancer Father     ?    Social History:  reports that he has never smoked. He does not have any smokeless tobacco history on file. He reports that he does not drink alcohol or use illicit drugs.    Review of Systems       Lipids:  Has had  increase in LDL and also increase in triglycerides despite taking Lipitor     Lab Results  Component Value Date   CHOL 248* 08/29/2013   HDL 45.60 08/29/2013   LDLCALC 153* 08/29/2013   LDLDIRECT 115.4 05/18/2011   TRIG 249.0* 08/29/2013   CHOLHDL 5 08/29/2013                  He says at night he was having some burning and tingling in his feet, was given Lyrica by his podiatrist and this is effective at the bedtime dose of 50 mg   LABS:   Office Visit on 12/12/2013  Component Date Value Ref Range Status  . POC Glucose 12/12/2013 115* 70 - 99 mg/dl Final    Physical Examination:  BP 140/90 mmHg  Pulse 100  Temp(Src) 97.9 F (36.6 C)  Resp 16  Ht 6' (1.829 m)  Wt 234 lb 6.4 oz (106.323 kg)  BMI 31.78 kg/m2  SpO2 97%     ASSESSMENT:  Diabetes type 2, uncontrolled   He has had persistently high A1c which in 10/15 was only slightly improved with restarting Invokana and continuing his Amaryl and metformin Although he thinks his blood sugars are excellent he had a lab glucose of 180 1 in the afternoon Fasting glucose is fairly good today Most likely is having postprandial hyperglycemia which he is not  monitoring He  is still having difficulty with the cost of medications since he does not have part D  HYPERTENSION: Blood pressure is higher today but was not as high with PCP last month   PLAN:   He will be given a prescription to start checking his blood sugars with his brand-name metered Emphasized the need for bringing monitor for download on each visit He will continue his regimen of Invokana, Amaryl  and metformin For now will try to get him to take an extra half tablet of Amaryl at lunchtime also If he is having difficulty affording Invokana he can try a half tablet also Sample of 10 mg Farxiga given for 2 weeks  He needs to check more readings after meals and bring his monitor for review To try and watch his diet consistently with limiting high fat meals, simple sugars and moderating on carbohydrates Discussed timing and targets of blood sugars He may well need insulin if blood sugar not well controlled. Also a candidate for Victoza  He wants to follow-up in one month's time to recheck A1c since he can resume work if his level is below 10%  Clinton Kirby 12/12/2013, 11:05 AM

## 2013-12-12 NOTE — Patient Instructions (Signed)
Please check blood sugars at least half the time about 2 hours after any meal and 2-3 times per week on waking up.  Please bring blood sugar monitor to each visit  Add 1/2 Glimeperide at lunch

## 2013-12-14 ENCOUNTER — Telehealth: Payer: Self-pay

## 2013-12-14 NOTE — Telephone Encounter (Signed)
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

## 2014-01-01 ENCOUNTER — Encounter: Payer: Medicare Other | Admitting: Internal Medicine

## 2014-01-08 ENCOUNTER — Other Ambulatory Visit: Payer: Medicare Other

## 2014-01-08 ENCOUNTER — Other Ambulatory Visit (INDEPENDENT_AMBULATORY_CARE_PROVIDER_SITE_OTHER): Payer: Medicare Other

## 2014-01-08 DIAGNOSIS — E1165 Type 2 diabetes mellitus with hyperglycemia: Secondary | ICD-10-CM

## 2014-01-08 DIAGNOSIS — IMO0002 Reserved for concepts with insufficient information to code with codable children: Secondary | ICD-10-CM

## 2014-01-08 LAB — COMPREHENSIVE METABOLIC PANEL
ALK PHOS: 56 U/L (ref 39–117)
ALT: 22 U/L (ref 0–53)
AST: 21 U/L (ref 0–37)
Albumin: 4.2 g/dL (ref 3.5–5.2)
BILIRUBIN TOTAL: 0.6 mg/dL (ref 0.2–1.2)
BUN: 17 mg/dL (ref 6–23)
CO2: 23 mEq/L (ref 19–32)
Calcium: 9.3 mg/dL (ref 8.4–10.5)
Chloride: 100 mEq/L (ref 96–112)
Creatinine, Ser: 0.9 mg/dL (ref 0.4–1.5)
GFR: 115.77 mL/min (ref >60.00)
Glucose, Bld: 195 mg/dL — ABNORMAL HIGH (ref 70–99)
Potassium: 4.1 mEq/L (ref 3.5–5.1)
SODIUM: 133 meq/L — AB (ref 135–145)
TOTAL PROTEIN: 7.1 g/dL (ref 6.0–8.3)

## 2014-01-08 LAB — HEMOGLOBIN A1C: HEMOGLOBIN A1C: 10.1 % — AB (ref 4.6–6.5)

## 2014-01-08 LAB — MICROALBUMIN / CREATININE URINE RATIO
Creatinine,U: 51.2 mg/dL
Microalb Creat Ratio: 2 mg/g (ref 0.0–30.0)
Microalb, Ur: 1 mg/dL (ref 0.0–1.9)

## 2014-01-09 ENCOUNTER — Other Ambulatory Visit: Payer: Self-pay | Admitting: *Deleted

## 2014-01-09 ENCOUNTER — Encounter: Payer: Self-pay | Admitting: Endocrinology

## 2014-01-09 ENCOUNTER — Ambulatory Visit (INDEPENDENT_AMBULATORY_CARE_PROVIDER_SITE_OTHER): Payer: Medicare Other | Admitting: Endocrinology

## 2014-01-09 VITALS — BP 124/80 | HR 104 | Temp 98.3°F | Resp 16 | Ht 72.0 in | Wt 230.6 lb

## 2014-01-09 DIAGNOSIS — E1165 Type 2 diabetes mellitus with hyperglycemia: Secondary | ICD-10-CM

## 2014-01-09 DIAGNOSIS — I10 Essential (primary) hypertension: Secondary | ICD-10-CM | POA: Diagnosis not present

## 2014-01-09 DIAGNOSIS — I2510 Atherosclerotic heart disease of native coronary artery without angina pectoris: Secondary | ICD-10-CM

## 2014-01-09 DIAGNOSIS — IMO0002 Reserved for concepts with insufficient information to code with codable children: Secondary | ICD-10-CM

## 2014-01-09 MED ORDER — GLIMEPIRIDE 4 MG PO TABS: ORAL_TABLET | ORAL | Status: DC

## 2014-01-09 NOTE — Progress Notes (Signed)
Patient ID: Clinton Kirby, male   DOB: 10-12-1945, 68 y.o.   MRN: [id number]   Reason for Appointment : Followup of 2 Diabetes  History of Present Illness          Diagnosis: Type 2 diabetes mellitus, date of diagnosis:2004      Prior history: At the time of diagnosis he was having frequent urination and his glucose was about 300 He thinks he was put on metformin and glimepiride at that time He believes his blood sugars were fairly well-controlled for the first few years  However review of his records indicate that his A1c has been around 12% since at least 2011 He was started on Invokana on 12/07/12. Prior to this home blood sugars were about 180-190 fasting.   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  He is trying to take 600 mg of Invokana in an attempt to improve his sugars A1c done about 2 months after starting Invokana was still high at 11.5% and now it is 10.1 although this was done only about a month after his previous test. Recently he has only 3 readings on his monitor in the last few days although he thinks he has another monitor He said glucose was about 180 this morning and was 195  in the afternoon on the lab work Still not checking readings after meals as directed  He has lost a little weight    Glucose monitoring:  sporadic         Glucometer:  ? One Touch Blood Glucose readings from  download: He has recent fasting readings of 141, 152  and 164  Hypoglycemia frequency: Never.          Self-care: The diet that the patient has been following is: Avoiding sweets and regular soft drinks     Meals: 3 meals per day.  for breakfast has  eggs, meat, toast but sometimes cereal. Tries to eat salads sometimes   Physical activity: exercise: walking almost daily       Dietician visit: Most recent:  At diagnosis only                Oral hypoglycemic drugs the patient is taking are:  metformin  1000 bid, Invokana 300  in a.m.;4 mg Glimepiride in a.m.      Side effects from medications have been: Minimal, mild balanitis with Invokana and increased urination Retinal exam: Most recent: 2014  Wt Readings from Last 3 Encounters:  01/09/14 230 lb 9.6 oz (104.599 kg)  12/12/13 234 lb 6.4 oz (106.323 kg)  12/07/13 235 lb (106.595 kg)     Lab Results  Component Value Date   HGBA1C 10.1* 01/08/2014   HGBA1C 11.5* 11/27/2013   HGBA1C 13.6* 08/29/2013   Lab Results  Component Value Date   MICROALBUR 1.0 01/08/2014   LDLCALC 153* 08/29/2013   CREATININE 0.9 01/08/2014  Medication List       This list is accurate as of: 01/09/14  9:10 AM.  Always use your most recent med list.               aspirin 325 MG tablet  Take 325 mg by mouth daily.     atorvastatin 10 MG tablet  Commonly known as:  LIPITOR  Take 4 tablets (40 mg total) by mouth daily.     canagliflozin 300 MG Tabs tablet  Commonly known as:  INVOKANA  Take 300 mg by mouth daily before breakfast.     DULoxetine 60 MG capsule  Commonly known as:  CYMBALTA  Take 1 capsule (60 mg total) by mouth daily.     glimepiride 4 MG tablet  Commonly known as:  AMARYL  Take 1 tablet (4 mg total) by mouth daily with breakfast.     glucose blood test strip  Check blood sugars twice a day     losartan 100 MG tablet  Commonly known as:  COZAAR  Take 1 tablet (100 mg total) by mouth daily.     metFORMIN 1000 MG tablet  Commonly known as:  GLUCOPHAGE  TAKE ONE  TABLET BY MOUTH TWICE DAILY WITH  A  MEAL     metoprolol succinate 50 MG 24 hr tablet  Commonly known as:  TOPROL-XL  Take 1 tablet (50 mg total) by mouth daily.     ODORLESS GARLIC PO  Take by mouth.     pregabalin 50 MG capsule  Commonly known as:  LYRICA  Take 50 mg by mouth daily.        Allergies: No Known Allergies  Past Medical History  Diagnosis Date  . CAD (coronary artery disease)   . Diabetes mellitus type II   . HTN (hypertension)   . ED (erectile dysfunction)   . Balanitis   . Hyperlipidemia   . Old MI (myocardial infarction) 12/07/2013    Past Surgical History  Procedure Laterality Date  . Umbilical hernia repair      Family History  Problem Relation Age of Onset  . Diabetes Other   . Diabetes Mother   . Cancer Father     ?    Social History:  reports that he has never smoked. He does not have any smokeless tobacco history on file. He reports that he does not drink alcohol or use illicit drugs.    Review of Systems       Lipids:  Has had  increase in LDL and also increase in triglycerides despite taking Lipitor    And is followed by PCP  Lab Results  Component Value Date   CHOL 248* 08/29/2013   HDL 45.60 08/29/2013   LDLCALC 153* 08/29/2013   LDLDIRECT 115.4 05/18/2011   TRIG 249.0* 08/29/2013   CHOLHDL 5 08/29/2013                  He has been taking Lyrica for neuropathy symptoms   LABS:   Appointment on 01/08/2014  Component Date Value Ref Range Status  . Hgb A1c MFr Bld 01/08/2014 10.1* 4.6 - 6.5 % Final   Glycemic Control Guidelines for People with Diabetes:Non Diabetic:  <6%Goal of Therapy: <7%Additional Action Suggested:  >8%   . Sodium 01/08/2014 133* 135 - 145 mEq/L Final  . Potassium 01/08/2014 4.1  3.5 - 5.1 mEq/L Final  . Chloride 01/08/2014 100  96 - 112 mEq/L Final  . CO2 01/08/2014 23  19 -  32 mEq/L Final  . Glucose, Bld 01/08/2014 195* 70 - 99 mg/dL Final  . BUN 01/08/2014 17  6 - 23 mg/dL Final  . Creatinine,  Ser 01/08/2014 0.9  0.4 - 1.5 mg/dL Final  . Total Bilirubin 01/08/2014 0.6  0.2 - 1.2 mg/dL Final  . Alkaline Phosphatase 01/08/2014 56  39 - 117 U/L Final  . AST 01/08/2014 21  0 - 37 U/L Final  . ALT 01/08/2014 22  0 - 53 U/L Final  . Total Protein 01/08/2014 7.1  6.0 - 8.3 g/dL Final  . Albumin 01/08/2014 4.2  3.5 - 5.2 g/dL Final  . Calcium 01/08/2014 9.3  8.4 - 10.5 mg/dL Final  . GFR 01/08/2014 115.77  >60.00 mL/min Final  . Microalb, Ur 01/08/2014 1.0  0.0 - 1.9 mg/dL Final  . Creatinine,U 01/08/2014 51.2   Final  . Microalb Creat Ratio 01/08/2014 2.0  0.0 - 30.0 mg/g Final    Physical Examination:  BP 124/80 mmHg  Pulse 104  Temp(Src) 98.3 F (36.8 C)  Resp 16  Ht 6' (1.829 m)  Wt 230 lb 9.6 oz (104.599 kg)  BMI 31.27 kg/m2  SpO2 96%     ASSESSMENT:  Diabetes type 2, uncontrolled   He has had persistently high A1c which is now only slightly improved with using  Invokana and continuing his Amaryl and metformin His fasting blood sugars are not significantly high and most likely has significant postprandial hyperglycemia which he is not monitoring since his A1c is still over 10% He has however had his A1c checked within a month of his previous level because of his work requirement He is still asking for modifying his treatment regimen for improving his blood sugars but he cannot afford any brand-name medications  HYPERTENSION: Blood pressure is  Better today   PLAN:   He will try Victoza if he can get this on the indigent program from the company Meanwhile increase Amaryl to 8 mg in the mornings Emphasized the need for reading his blood sugars about 2 hours after meals more regularly Continue exercise program  He wants to follow-up in 3 weeks time to recheck A1c since he can resume work if his level is below 10%  Glenice Ciccone 01/09/2014, 9:10 AM

## 2014-01-09 NOTE — Patient Instructions (Signed)
Glimeperide 2 pills in am    Please check blood sugars at least half the time about 2 hours after any meal and 3 times per week on waking up. Please bring blood sugar monitor to each visit

## 2014-01-10 ENCOUNTER — Other Ambulatory Visit: Payer: Self-pay | Admitting: Internal Medicine

## 2014-01-16 ENCOUNTER — Encounter: Payer: Medicare Other | Admitting: Internal Medicine

## 2014-01-23 ENCOUNTER — Other Ambulatory Visit: Payer: Medicare Other

## 2014-02-28 ENCOUNTER — Other Ambulatory Visit: Payer: Self-pay | Admitting: Endocrinology

## 2014-02-28 DIAGNOSIS — I2510 Atherosclerotic heart disease of native coronary artery without angina pectoris: Secondary | ICD-10-CM

## 2014-02-28 NOTE — Telephone Encounter (Signed)
Patient states he would like a refill on his [place]

## 2014-02-28 NOTE — Telephone Encounter (Signed)
See below. Rx is listed under a different provider, ok to refill? Thanks!

## 2014-03-01 MED ORDER — CANAGLIFLOZIN 300 MG PO TABS: 300.0000 mg | ORAL_TABLET | Freq: Every day | ORAL | Status: DC

## 2014-03-05 MED ORDER — CANAGLIFLOZIN 300 MG PO TABS: 300.0000 mg | ORAL_TABLET | Freq: Every day | ORAL | Status: DC

## 2014-03-05 NOTE — Telephone Encounter (Signed)
Rx sent 

## 2014-03-05 NOTE — Addendum Note (Signed)
Addended by: Moody Bruins E on: 03/05/2014 07:52 AM   Modules accepted: Orders

## 2014-03-12 ENCOUNTER — Other Ambulatory Visit: Payer: Medicare Other

## 2014-03-15 ENCOUNTER — Ambulatory Visit: Payer: Medicare Other | Admitting: Endocrinology

## 2014-04-11 ENCOUNTER — Telehealth: Payer: Self-pay | Admitting: Endocrinology

## 2014-04-11 ENCOUNTER — Telehealth: Payer: Self-pay | Admitting: *Deleted

## 2014-04-11 ENCOUNTER — Encounter: Payer: Self-pay | Admitting: *Deleted

## 2014-04-11 NOTE — Telephone Encounter (Signed)
Please see below and advise.

## 2014-04-11 NOTE — Telephone Encounter (Signed)
Left msg on triage requesting call back needing refills on medications. Called pt back he stated he can't think of the name of med md rx for his diabetic nerve pain, and he also md to change the [place] because med is too expensive. Inform pt that since Dr. Dwyane Dee is the one he see for his diabetes & since he rx the Ivokana he need to contact his office for med change. Per chart he also rx lyrica for nerve pain. Inform pt the name of medication, and told him to request refill from Dr. Dwyane Dee...Clinton Kirby

## 2014-04-11 NOTE — Telephone Encounter (Signed)
Patient called stating he can not afford the Invokana  Dr. Dwyane Dee had prescribed Januvia and still expensive  Diabetic nerve pain medication; he would like a refill   [place]   Please advise patient on what to take    Thank you

## 2014-04-11 NOTE — Telephone Encounter (Signed)
He can still try to get coverage with the application.  Also he can try to get coverage from [employer] for [employer].

## 2014-04-11 NOTE — Telephone Encounter (Signed)
Mailbox is currently full, unable to leave a message, I will put an application in the mail for him.

## 2014-04-11 NOTE — Telephone Encounter (Signed)
He can try and get Victoza from [employer] on indigent program otherwise will have to take insulin.  What insurance does he have?

## 2014-04-11 NOTE — Telephone Encounter (Signed)
Patient has medicare, [employer] will not approve indigent help because he has [employer].

## 2014-04-12 ENCOUNTER — Other Ambulatory Visit: Payer: Self-pay | Admitting: *Deleted

## 2014-04-12 MED ORDER — ATORVASTATIN CALCIUM 40 MG PO TABS: 40.0000 mg | ORAL_TABLET | Freq: Every day | ORAL | Status: DC

## 2014-04-13 DIAGNOSIS — M2042 Other hammer toe(s) (acquired), left foot: Secondary | ICD-10-CM | POA: Diagnosis not present

## 2014-04-13 DIAGNOSIS — M2041 Other hammer toe(s) (acquired), right foot: Secondary | ICD-10-CM | POA: Diagnosis not present

## 2014-04-13 DIAGNOSIS — E1142 Type 2 diabetes mellitus with diabetic polyneuropathy: Secondary | ICD-10-CM | POA: Diagnosis not present

## 2014-04-16 ENCOUNTER — Other Ambulatory Visit (INDEPENDENT_AMBULATORY_CARE_PROVIDER_SITE_OTHER): Payer: Medicare Other

## 2014-04-16 DIAGNOSIS — E1165 Type 2 diabetes mellitus with hyperglycemia: Secondary | ICD-10-CM | POA: Diagnosis not present

## 2014-04-16 DIAGNOSIS — IMO0002 Reserved for concepts with insufficient information to code with codable children: Secondary | ICD-10-CM

## 2014-04-16 LAB — HEMOGLOBIN A1C: Hgb A1c MFr Bld: 12.7 % — ABNORMAL HIGH (ref 4.6–6.5)

## 2014-04-19 ENCOUNTER — Encounter: Payer: Self-pay | Admitting: Endocrinology

## 2014-04-19 ENCOUNTER — Ambulatory Visit (INDEPENDENT_AMBULATORY_CARE_PROVIDER_SITE_OTHER): Payer: Medicare Other | Admitting: Endocrinology

## 2014-04-19 VITALS — BP 131/80 | HR 109 | Temp 98.4°F | Resp 16 | Ht 72.0 in | Wt 233.0 lb

## 2014-04-19 DIAGNOSIS — E1165 Type 2 diabetes mellitus with hyperglycemia: Secondary | ICD-10-CM | POA: Diagnosis not present

## 2014-04-19 DIAGNOSIS — IMO0002 Reserved for concepts with insufficient information to code with codable children: Secondary | ICD-10-CM

## 2014-04-19 LAB — GLUCOSE, POCT (MANUAL RESULT ENTRY): POC GLUCOSE: 396 mg/dL — AB (ref 70–99)

## 2014-04-19 MED ORDER — EMPAGLIFLOZIN 25 MG PO TABS: 25.0000 mg | ORAL_TABLET | Freq: Every day | ORAL | Status: DC

## 2014-04-19 NOTE — Patient Instructions (Signed)
Please check blood sugars at least half the time about 2 hours after any meal and 3 times per week on waking up. Please bring blood sugar monitor to each visit. Recommended blood sugar levels about 2 hours after meal is 140-180 and on waking up 90-130  Jardiance 25 mg in am

## 2014-04-19 NOTE — Progress Notes (Signed)
Patient ID: Clinton Kirby, male   DOB: Oct 22, 1945, 69 y.o.   MRN: [id number]   Reason for Appointment : Followup of 2 Diabetes  History of Present Illness          Diagnosis: Type 2 diabetes mellitus, date of diagnosis:2004      Prior history: At the time of diagnosis he was having frequent urination and his glucose was about 300 He thinks he was put on metformin and glimepiride at that time He believes his blood sugars were fairly well-controlled for the first few years  However review of his records indicate that his A1c has been around 12% since at least 2011 He was started on Invokana on 12/07/12. Prior to this home blood sugars were about 180-190 fasting.   His lab glucose readings were usually over 200  Recent history:  He is still having significant difficulty with getting his blood sugar control because of cost of medications He thinks his blood sugars are well controlled with taking Invokana 300 mg daily although his A1c has not been below 10% Most likely was checking blood sugars only in the morning Because of lack of insurance coverage for Invokana he is not taking for the last 2 months Blood sugars are considerably higher as expected with his taking only metformin and Amaryl With his high blood sugars he has had increased thirst and urination although has not lost any weight. 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    Glucose monitoring:  sporadic         Glucometer:  ? One Touch Blood Glucose readings from  recall: As above  Hypoglycemia frequency: Never.          Self-care: The diet that the patient has been following is: Avoiding sweets and regular soft drinks, is drinking light juice which she thinks has only about  16 g carbohydrate/sugar     Meals: 3 meals per day.  for breakfast has  eggs, meat, toast but sometimes cereal. Tries to eat salads sometimes   Physical activity: exercise: walking periodically        Dietician visit: Most recent:  At diagnosis only                Oral hypoglycemic drugs the patient is taking are:  metformin  1000 bid, 4 mg Glimepiride twice a day      Side effects from medications have been: Minimal, mild balanitis with Invokana and increased urination Retinal exam: Most recent: 2014  Wt Readings from Last 3 Encounters:  04/19/14 233 lb (105.688 kg)  01/09/14 230 lb 9.6 oz (104.599 kg)  12/12/13 234 lb 6.4 oz (106.323 kg)     Lab Results  Component Value Date   HGBA1C 12.7* 04/16/2014   HGBA1C 10.1* 01/08/2014   HGBA1C 11.5* 11/27/2013   Lab Results  Component Value Date   MICROALBUR 1.0 01/08/2014   LDLCALC 153* 08/29/2013   CREATININE 0.9 01/08/2014       Medication List       This list is accurate as of: 04/19/14  1:56 PM.  Always use your most recent med list.               aspirin 325  MG tablet  Take 325 mg by mouth daily.     atorvastatin 40 MG tablet  Commonly known as:  LIPITOR  Take 1 tablet (40 mg total) by mouth daily.     canagliflozin 300 MG Tabs tablet  Commonly known as:  INVOKANA  Take 300 mg by mouth daily before breakfast.     clotrimazole-betamethasone cream  Commonly known as:  LOTRISONE  APPLY TOPICALLY TO AFFECTED AREA TWICE DAILY FOR A MONTH     DULoxetine 60 MG capsule  Commonly known as:  CYMBALTA  Take 1 capsule (60 mg total) by mouth daily.     glimepiride 4 MG tablet  Commonly known as:  AMARYL  Take 2 tablets daily     glucose blood test strip  Check blood sugars twice a day     losartan 100 MG tablet  Commonly known as:  COZAAR  Take 1 tablet (100 mg total) by mouth daily.     metFORMIN 1000 MG tablet  Commonly known as:  GLUCOPHAGE  TAKE ONE TABLET BY MOUTH TWICE DAILY WITH  A  MEAL      metoprolol succinate 50 MG 24 hr tablet  Commonly known as:  TOPROL-XL  Take 1 tablet (50 mg total) by mouth daily.     ODORLESS GARLIC PO  Take by mouth.     pregabalin 50 MG capsule  Commonly known as:  LYRICA  Take 50 mg by mouth daily.        Allergies: No Known Allergies  Past Medical History  Diagnosis Date  . CAD (coronary artery disease)   . Diabetes mellitus type II   . HTN (hypertension)   . ED (erectile dysfunction)   . Balanitis   . Hyperlipidemia   . Old MI (myocardial infarction) 12/07/2013    Past Surgical History  Procedure Laterality Date  . Umbilical hernia repair      Family History  Problem Relation Age of Onset  . Diabetes Other   . Diabetes Mother   . Cancer Father     ?    Social History:  reports that he has never smoked. He does not have any smokeless tobacco history on file. He reports that he does not drink alcohol or use illicit drugs.    Review of Systems       Lipids:  Has had  increase in LDL and also increase in triglycerides despite taking Lipitor    Treatment is determined by his PCP   Lab Results  Component Value Date   CHOL 248* 08/29/2013   HDL 45.60 08/29/2013   LDLCALC 153* 08/29/2013   LDLDIRECT 115.4 05/18/2011   TRIG 249.0* 08/29/2013   CHOLHDL 5 08/29/2013           HYPERTENSION: Well controlled with losartan        He has been taking Lyrica for neuropathy symptoms Last foot exam was in 12/14   LABS:   Office Visit on 04/19/2014  Component Date Value Ref Range Status  . POC Glucose 04/19/2014 396* 70 - 99 mg/dl Final  Lab on 04/16/2014  Component Date Value Ref Range Status  . Hgb A1c MFr Bld 04/16/2014 12.7* 4.6 - 6.5 % Final   Glycemic Control Guidelines for People with Diabetes:Non Diabetic:  <6%Goal of Therapy: <7%Additional Action Suggested:  >8%     Physical Examination:  BP 131/80 mmHg  Pulse 109  Temp(Src) 98.4 F (36.9 C)  Resp 16  Ht 6' (1.829 m)  Wt 233 lb (  105.688 kg)  BMI 31.59  kg/m2  SpO2 96%     No pedal edema  ASSESSMENT/PLAN  Diabetes type 2, uncontrolled   He has had persistently high A1c which is now markedly increased because of his not taking Invokana and not responding to Amaryl and metformin Although he claims his blood sugars good controlled with Invokana he probably still continues to have postprandial readings with this and A1c readings generally over 10%  He is still having issues with out-of-pocket expense with brand name medications However appears to have coverage for Jardiance on his insurance currently He will try to 30 day trial with the plan of action was coupon for now Advised him to check his blood sugars more consistently even after meals and bring monitor for download No change in Amaryl her metformin as yet Also may consider Victoza on the next visit  Patient Instructions  Please check blood sugars at least half the time about 2 hours after any meal and 3 times per week on waking up. Please bring blood sugar monitor to each visit. Recommended blood sugar levels about 2 hours after meal is 140-180 and on waking up 90-130  Jardiance 25 mg in am        [facility] 04/19/2014, 1:56 PM

## 2014-05-14 ENCOUNTER — Other Ambulatory Visit: Payer: Medicare Other

## 2014-05-17 ENCOUNTER — Telehealth: Payer: Self-pay | Admitting: Endocrinology

## 2014-05-17 ENCOUNTER — Ambulatory Visit: Payer: Medicare Other | Admitting: Endocrinology

## 2014-05-17 DIAGNOSIS — Z0289 Encounter for other administrative examinations: Secondary | ICD-10-CM

## 2014-05-17 NOTE — Telephone Encounter (Signed)
Follow-up necessary, please schedule appointment

## 2014-05-17 NOTE — Telephone Encounter (Signed)
Patient no showed today's appt. Please advise on how to follow up. °A. No follow up necessary. °B. Follow up urgent. Contact patient immediately. °C. Follow up necessary. Contact patient and schedule visit in ___ days. °D. Follow up advised. Contact patient and schedule visit in ____weeks. ° °

## 2014-06-15 ENCOUNTER — Other Ambulatory Visit: Payer: Medicare Other

## 2014-06-28 ENCOUNTER — Ambulatory Visit: Payer: Medicare Other | Admitting: Endocrinology

## 2014-07-25 ENCOUNTER — Other Ambulatory Visit: Payer: Self-pay | Admitting: Endocrinology

## 2014-07-28 ENCOUNTER — Other Ambulatory Visit: Payer: Self-pay | Admitting: Internal Medicine

## 2014-07-30 ENCOUNTER — Telehealth: Payer: Self-pay | Admitting: *Deleted

## 2014-07-30 ENCOUNTER — Other Ambulatory Visit: Payer: Self-pay | Admitting: Endocrinology

## 2014-07-30 NOTE — Telephone Encounter (Signed)
Patient is requesting a refill, he was suppose to be seen in April but no showed, and canceled his appointments in May.  Patient needs to be seen before refills given

## 2014-08-03 ENCOUNTER — Telehealth: Payer: Self-pay

## 2014-08-03 NOTE — Telephone Encounter (Signed)
Could not leave VM for HgA1C recheck due to mailbox being full

## 2014-09-11 DIAGNOSIS — H5213 Myopia, bilateral: Secondary | ICD-10-CM | POA: Diagnosis not present

## 2014-09-11 DIAGNOSIS — H40013 Open angle with borderline findings, low risk, bilateral: Secondary | ICD-10-CM | POA: Diagnosis not present

## 2014-09-11 DIAGNOSIS — H52223 Regular astigmatism, bilateral: Secondary | ICD-10-CM | POA: Diagnosis not present

## 2014-09-11 DIAGNOSIS — H524 Presbyopia: Secondary | ICD-10-CM | POA: Diagnosis not present

## 2014-09-11 DIAGNOSIS — E11339 Type 2 diabetes mellitus with moderate nonproliferative diabetic retinopathy without macular edema: Secondary | ICD-10-CM | POA: Diagnosis not present

## 2014-10-08 ENCOUNTER — Ambulatory Visit (INDEPENDENT_AMBULATORY_CARE_PROVIDER_SITE_OTHER): Payer: Medicare Other | Admitting: Podiatry

## 2014-10-08 ENCOUNTER — Encounter: Payer: Self-pay | Admitting: Podiatry

## 2014-10-08 VITALS — BP 169/106 | HR 95 | Ht 73.0 in

## 2014-10-08 DIAGNOSIS — M79606 Pain in leg, unspecified: Secondary | ICD-10-CM | POA: Diagnosis not present

## 2014-10-08 DIAGNOSIS — B351 Tinea unguium: Secondary | ICD-10-CM | POA: Diagnosis not present

## 2014-10-08 DIAGNOSIS — E0840 Diabetes mellitus due to underlying condition with diabetic neuropathy, unspecified: Secondary | ICD-10-CM | POA: Insufficient documentation

## 2014-10-08 DIAGNOSIS — E0842 Diabetes mellitus due to underlying condition with diabetic polyneuropathy: Secondary | ICD-10-CM | POA: Diagnosis not present

## 2014-10-08 MED ORDER — PREGABALIN 50 MG PO CAPS: 50.0000 mg | ORAL_CAPSULE | Freq: Every day | ORAL | Status: DC

## 2014-10-08 NOTE — Progress Notes (Signed)
SUBJECTIVE: 69 y.o. year old male presents for diabetic foot care. Having burning foot pain on bottom of both feet x 1 year and gets helped with Lyrica 50 mg/day. Feet swells at times.  HgA1c was 8.5. Working full time driving truck.   REVIEW OF SYSTEMS: A comprehensive review of systems was negative except for: heart attack in 2004 and existing diabetes.   OBJECTIVE: DERMATOLOGIC EXAMINATION: Nails: Thick dystrophic nails x 10.  VASCULAR EXAMINATION OF LOWER LIMBS: Pedal pulses: All pedal pulses are palpable on right and not palpable on left both DP and PT.  NEUROLOGIC EXAMINATION OF THE LOWER LIMBS: Failed to respond to Monofilament sensory testing bilateral.  MUSCULOSKELETAL EXAMINATION: No gross deformities.   ASSESSMENT: DM under control. Diabetic neuropathy.  Onychomycosis.   PLAN: Reviewed findings.  All nails debrided.

## 2014-10-08 NOTE — Patient Instructions (Signed)
Seen for hypertrophic nails. All nails debrided. Return in 3 months or as needed.  

## 2014-10-12 DIAGNOSIS — E119 Type 2 diabetes mellitus without complications: Secondary | ICD-10-CM | POA: Diagnosis not present

## 2014-10-12 DIAGNOSIS — L03011 Cellulitis of right finger: Secondary | ICD-10-CM | POA: Diagnosis not present

## 2014-10-20 DIAGNOSIS — L03011 Cellulitis of right finger: Secondary | ICD-10-CM | POA: Diagnosis not present

## 2014-10-23 DIAGNOSIS — E11331 Type 2 diabetes mellitus with moderate nonproliferative diabetic retinopathy with macular edema: Secondary | ICD-10-CM | POA: Diagnosis not present

## 2014-10-23 DIAGNOSIS — E11339 Type 2 diabetes mellitus with moderate nonproliferative diabetic retinopathy without macular edema: Secondary | ICD-10-CM | POA: Diagnosis not present

## 2014-10-24 ENCOUNTER — Telehealth: Payer: Self-pay

## 2014-10-24 NOTE — Telephone Encounter (Signed)
Call to introduce awv and noted the patient can come in early (1pm ) prior to [address] since he lives in [place]; Left message to call back practice and will be available to tell him more or answer questions

## 2014-10-29 NOTE — Telephone Encounter (Signed)
Call for AWV and the patient was on the road out of state; Needed to cancel apt for the 27th and will be home on the 29-30 Agreed to reschedule [facility] on the 30th at 2;30 and will come in for AWV at 1:45 the same day

## 2014-11-05 ENCOUNTER — Encounter: Payer: Medicare Other | Admitting: Internal Medicine

## 2014-11-09 ENCOUNTER — Ambulatory Visit (INDEPENDENT_AMBULATORY_CARE_PROVIDER_SITE_OTHER): Payer: Medicare Other | Admitting: Internal Medicine

## 2014-11-09 ENCOUNTER — Other Ambulatory Visit (INDEPENDENT_AMBULATORY_CARE_PROVIDER_SITE_OTHER): Payer: Medicare Other

## 2014-11-09 ENCOUNTER — Encounter: Payer: Self-pay | Admitting: Internal Medicine

## 2014-11-09 VITALS — BP 122/80 | HR 90 | Ht 72.0 in | Wt 239.8 lb

## 2014-11-09 DIAGNOSIS — E1165 Type 2 diabetes mellitus with hyperglycemia: Secondary | ICD-10-CM | POA: Diagnosis not present

## 2014-11-09 DIAGNOSIS — E785 Hyperlipidemia, unspecified: Secondary | ICD-10-CM

## 2014-11-09 DIAGNOSIS — Z0000 Encounter for general adult medical examination without abnormal findings: Secondary | ICD-10-CM | POA: Diagnosis not present

## 2014-11-09 DIAGNOSIS — I2510 Atherosclerotic heart disease of native coronary artery without angina pectoris: Secondary | ICD-10-CM

## 2014-11-09 DIAGNOSIS — R7989 Other specified abnormal findings of blood chemistry: Secondary | ICD-10-CM

## 2014-11-09 DIAGNOSIS — E0842 Diabetes mellitus due to underlying condition with diabetic polyneuropathy: Secondary | ICD-10-CM | POA: Diagnosis not present

## 2014-11-09 DIAGNOSIS — IMO0002 Reserved for concepts with insufficient information to code with codable children: Secondary | ICD-10-CM

## 2014-11-09 DIAGNOSIS — I10 Essential (primary) hypertension: Secondary | ICD-10-CM

## 2014-11-09 LAB — CBC WITH DIFFERENTIAL/PLATELET
BASOS PCT: 0.7 % (ref 0.0–3.0)
Basophils Absolute: 0.1 10*3/uL (ref 0.0–0.1)
EOS PCT: 5.9 % — AB (ref 0.0–5.0)
Eosinophils Absolute: 0.5 10*3/uL (ref 0.0–0.7)
HCT: 49.5 % (ref 39.0–52.0)
Hemoglobin: 16.7 g/dL (ref 13.0–17.0)
LYMPHS ABS: 1.9 10*3/uL (ref 0.7–4.0)
Lymphocytes Relative: 24.6 % (ref 12.0–46.0)
MCHC: 33.7 g/dL (ref 30.0–36.0)
MCV: 89.7 fl (ref 78.0–100.0)
MONOS PCT: 7.3 % (ref 3.0–12.0)
Monocytes Absolute: 0.6 10*3/uL (ref 0.1–1.0)
NEUTROS ABS: 4.7 10*3/uL (ref 1.4–7.7)
NEUTROS PCT: 61.5 % (ref 43.0–77.0)
Platelets: 303 10*3/uL (ref 150.0–400.0)
RBC: 5.52 Mil/uL (ref 4.22–5.81)
RDW: 14.5 % (ref 11.5–15.5)
WBC: 7.6 10*3/uL (ref 4.0–10.5)

## 2014-11-09 LAB — HEPATIC FUNCTION PANEL
ALT: 18 U/L (ref 0–53)
AST: 14 U/L (ref 0–37)
Albumin: 4.5 g/dL (ref 3.5–5.2)
Alkaline Phosphatase: 54 U/L (ref 39–117)
BILIRUBIN TOTAL: 0.6 mg/dL (ref 0.2–1.2)
Bilirubin, Direct: 0.1 mg/dL (ref 0.0–0.3)
TOTAL PROTEIN: 7.2 g/dL (ref 6.0–8.3)

## 2014-11-09 LAB — BASIC METABOLIC PANEL
BUN: 19 mg/dL (ref 6–23)
CALCIUM: 10.3 mg/dL (ref 8.4–10.5)
CO2: 31 meq/L (ref 19–32)
CREATININE: 0.91 mg/dL (ref 0.40–1.50)
Chloride: 105 mEq/L (ref 96–112)
GFR: 106.74 mL/min (ref >60.00)
Glucose, Bld: 84 mg/dL (ref 70–99)
Potassium: 4.4 mEq/L (ref 3.5–5.1)
Sodium: 141 mEq/L (ref 135–145)

## 2014-11-09 LAB — LIPID PANEL
CHOL/HDL RATIO: 4
CHOLESTEROL: 195 mg/dL (ref 0–200)
HDL: 51.4 mg/dL (ref >39.00)
NonHDL: 143.78
Triglycerides: 203 mg/dL — ABNORMAL HIGH (ref 0.0–149.0)
VLDL: 40.6 mg/dL — ABNORMAL HIGH (ref 0.0–40.0)

## 2014-11-09 LAB — URINALYSIS, ROUTINE W REFLEX MICROSCOPIC
BILIRUBIN URINE: NEGATIVE
KETONES UR: NEGATIVE
Nitrite: NEGATIVE
PH: 6 (ref 5.0–8.0)
Specific Gravity, Urine: 1.025 (ref 1.000–1.030)
UROBILINOGEN UA: 0.2 (ref 0.0–1.0)
Urine Glucose: >=1000 — AB

## 2014-11-09 LAB — PSA: PSA: 41.53 ng/mL — AB (ref 0.10–4.00)

## 2014-11-09 LAB — TSH: TSH: 0.72 u[IU]/mL (ref 0.35–4.50)

## 2014-11-09 LAB — LDL CHOLESTEROL, DIRECT: Direct LDL: 136 mg/dL

## 2014-11-09 LAB — HEMOGLOBIN A1C: Hgb A1c MFr Bld: 8.4 % — ABNORMAL HIGH (ref 4.6–6.5)

## 2014-11-09 MED ORDER — CLOTRIMAZOLE-BETAMETHASONE 1-0.05 % EX CREA: TOPICAL_CREAM | Freq: Two times a day (BID) | CUTANEOUS | Status: DC

## 2014-11-09 MED ORDER — ONETOUCH DELICA LANCETS FINE MISC: 1.0000 | Freq: Every day | Status: DC | PRN

## 2014-11-09 MED ORDER — GLUCOSE BLOOD VI STRP: ORAL_STRIP | Status: DC

## 2014-11-09 NOTE — Progress Notes (Signed)
Subjective:   Clinton Kirby is a 69 y.o. male who presents for Medicare Annual (Subsequent) preventive examination.  Review of Systems:   Cardiac Risk Factors include: advanced age (>31men, >14 women);diabetes mellitus;dyslipidemia;obesity (BMI >30kg/m2);hypertension HRA assessment completed during visit;  Patient is here for Annual Wellness Assessment The Patient was informed that this wellness visit is to identify risk and educate on how to reduce risk for increase disease through lifestyle changes.   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;  Barriers; getting truck in all facilities; Tries to eat right; discussed cooler in truck; discussed protein drinks;  Exercise; Unloading truck x 1 per week   SAFETY; wife one level home Safety reviewed for the home; including removal of clutter; clear paths through the home, eliminating clutter, railing as needed; bathroom safety; community safety; smoke detectors and firearms safety as well as sun protection;  Driving accidents; deferred;  Stressors; Cares a great about keeping his BS in control  Medication review/ New meds  Fall assessment  Gait assessment  Mobilization and Functional losses in the last year.   Urinary or fecal incontinence reviewed   Lifeline: http://www.lifelinesys.com/content/home; 9310107268 [phone]   Counseling:  Hep C antibody Colonoscopy; had one but it has / may be due/ discussed colo-guard EKG 12/07/2013 Hearing: no noted Ophthalmology exam; vision check and now being checked by Dr. Purcell Nails because they thought  he had something on the back of his eye but eye exam was in July of this year. Eyes are ok; glaucoma or retinopathy Dental; has insurance    Immunizations Due  Tetanus Zostavax/ declined Prevnar 13 does not take Flu; does not take      Objective:     Vitals: BP 122/80 mmHg  Pulse 90  Ht 6' (1.829 m)  Wt 239 lb 12 oz (108.75 kg)  BMI 32.51 kg/m2  SpO2 95%  Tobacco History  Smoking status  . Never Smoker   Smokeless tobacco  . Not on file     Counseling given: Yes   Past Medical History  Diagnosis Date  . CAD (coronary artery disease)   . Diabetes mellitus type II   . HTN (hypertension)   . ED (erectile dysfunction)   . Balanitis   . Hyperlipidemia   . Old MI (myocardial infarction) 12/07/2013   Past Surgical History  Procedure Laterality Date  . Umbilical hernia repair     Family History  Problem Relation Age of Onset  . Diabetes Other   . Diabetes Mother   . Cancer Father     ?   History  Sexual Activity  . Sexual Activity: Yes    Outpatient Encounter Prescriptions as of 11/09/2014  Medication Sig  . aspirin 325 MG tablet Take 325 mg by mouth daily.  [name] atorvastatin (LIPITOR) 40 MG tablet Take 1 tablet (40 mg total) by mouth daily.  . clotrimazole-betamethasone (LOTRISONE) cream APPLY TOPICALLY TO AFFECTED AREA TWICE DAILY FOR A MONTH  . glimepiride (AMARYL) 4 MG tablet Take 2 tablets daily  . glucose blood test strip Check blood sugars  twice a day  . INVOKANA 300 MG TABS tablet TAKE ONE TABLET BY MOUTH ONCE DAILY BEFORE BREAKFAST  . losartan (COZAAR) 100 MG tablet Take 1 tablet (100 mg total) by mouth daily.  . metFORMIN (GLUCOPHAGE) 1000 MG tablet TAKE ONE TABLET BY MOUTH TWICE DAILY WITH MEALS  . metoprolol succinate (TOPROL-XL) 50 MG 24 hr tablet Take 1 tablet (50 mg total) by mouth daily.  . ODORLESS GARLIC PO Take by mouth.  . pregabalin (LYRICA) 50 MG capsule Take 1 capsule (50 mg total) by mouth daily.  . DULoxetine (CYMBALTA) 60 MG  capsule Take 1 capsule (60 mg total) by mouth daily. (Patient not taking: Reported on 11/09/2014)  . empagliflozin (JARDIANCE) 25 MG TABS tablet Take 25 mg by mouth daily. (Patient not taking: Reported on 11/09/2014)   No facility-administered encounter medications on file as of 11/09/2014.    Activities of Daily Living In your present state of health, do you have any difficulty performing the following activities: 11/09/2014  Hearing? N  Vision? N  Walking or climbing stairs? N  Dressing or bathing? N  Doing errands, shopping? N  Preparing Food and eating ? N  Using the Toilet? N  In the past six months, have you accidently leaked urine? N  Do you have problems with loss of bowel control? N  Managing your Medications? N  Managing your Finances? N  Housekeeping or managing your Housekeeping? N    Patient Care Team: [name], MD as PCP - General    Assessment:  Assessment   Today patient counseled on age appropriate routine health concerns for screening and prevention, each reviewed and up to date or declined. Immunizations reviewed and declined. Labs deferred for CPE or medical fup by MD.  Risk factors for depression reviewed and negative. Hearing function and visual acuity are intact. ADLs screened and addressed as needed. Functional ability and level of safety reviewed and appropriate. Educated on memory loss and AD8 or MMSE completed and score 0. Education, counseling and referrals performed based on assessed risks today. Patient provided with a copy of personalized plan for preventive services and due dates  FALL PREVENTION reviewed   HEPATITIS SCREEN reviewed; no risk identified  TOBACCO/ETOH or other DRUG use was negative   Goals    . patient     No 1 goal is getting DM under control      Fall Risk Fall Risk  11/09/2014  Falls in the past year? No  Risk for fall due to : (No Data)  Risk for fall due to (comments): accident and raining   Depression  Screen PHQ 2/9 Scores 11/09/2014  PHQ - 2 Score 0     Cognitive Testing No flowsheet data found.   There is no immunization history on file for this patient. Screening Tests Health Maintenance  Topic Date Due  . Hepatitis C Screening  03-28-1945  . TETANUS/TDAP  06/12/1966  . COLONOSCOPY  06/11/1997  . ZOSTAVAX  06/12/2007  . PNA vac Low Risk Adult (1 of 2 - PCV13) 06/11/2012  . FOOT EXAM  12/07/2013  . INFLUENZA VACCINE  09/10/2014  . HEMOGLOBIN A1C  10/17/2014  . OPHTHALMOLOGY EXAM  08/10/2015 (Originally 06/11/1957)  . URINE MICROALBUMIN  01/09/2015      Plan:   To call the insurance company and inquire about payment for medication to be sure charges were correct; as well as find out where he can get his diabetic supplies    During the course of  the visit the patient was educated and counseled about the following appropriate screening and preventive services:   Vaccines to include Pneumoccal, Influenza, Hepatitis B, Td, Zostavax, HCV  Electrocardiogram defer to cardiology   Cardiovascular Disease/ deferred to cardiology  Colorectal cancer screening / has had not sure of diet   Diabetes screening/ trying different meds at present but on medication now  Glaucoma screening/ eye exam completed and referred to Dr. Purcell Nails;   Nutrition counseling / discussed   Patient Instructions (the written plan) was given to the patient.   Wynetta Fines, RN  11/09/2014

## 2014-11-09 NOTE — Assessment & Plan Note (Addendum)
Invokana, Amaryl, Losartan, Lipitor, Toprol, ASA

## 2014-11-09 NOTE — Assessment & Plan Note (Addendum)
Labs Invokana, Amaryl, Losartan, Lipitor, Toprol, ASA

## 2014-11-09 NOTE — Assessment & Plan Note (Signed)
Invokana, Amaryl, Losartan, Lipitor, Toprol,

## 2014-11-09 NOTE — Patient Instructions (Addendum)
[name] , Thank you for taking time to come for your Medicare Wellness Visit. I appreciate your ongoing commitment to your health goals. Please review the following plan we discussed and let me know if I can assist you in the future.   Will fup with insurance regarding where to buy diabetic supplies.    These are the goals we discussed: Goals    None      This is a list of the screening recommended for you and due dates:  Health Maintenance  Topic Date Due  .  Hepatitis C: One time screening is recommended by Center for Disease Control  (CDC) for  adults born from [birth year] through 1963.   14-Oct-1945  . Eye exam for diabetics  06/11/1957  . Tetanus Vaccine  06/12/1966  . Colon Cancer Screening  06/11/1997  . Shingles Vaccine  06/12/2007  . Pneumonia vaccines (1 of 2 - PCV13) 06/11/2012  . Complete foot exam   12/07/2013  . Flu Shot  09/10/2014  . Hemoglobin A1C  10/17/2014  . Urine Protein Check  01/09/2015       Preventive Care for Adults A healthy lifestyle and preventive care can promote health and wellness. Preventive health guidelines for men include the following key practices:  A routine yearly physical is a good way to check with your health care provider about your health and preventative screening. It is a chance to share any concerns and updates on your health and to receive a thorough exam.  Visit your dentist for a routine exam and preventative care every 6 months. Brush your teeth twice a day and floss once a day. Good oral hygiene prevents tooth decay and gum disease.  The frequency of eye exams is based on your age, health, family medical history, use of contact lenses, and other factors. Follow your health care provider's recommendations for frequency of eye exams.  Eat a healthy diet. Foods such as vegetables, fruits, whole grains, low-fat dairy products, and lean protein foods contain the nutrients you need without too many calories. Decrease your intake of  foods high in solid fats, added sugars, and salt. Eat the right amount of calories for you.Get information about a proper diet from your health care provider, if necessary.  Regular physical exercise is one of the most important things you can do for your health. Most adults should get at least 150 minutes of moderate-intensity exercise (any activity that increases your heart rate and causes you to sweat) each week. In addition, most adults need muscle-strengthening exercises on 2 or more days a week.  Maintain a healthy weight. The body mass index (BMI) is a screening tool to identify possible weight problems. It provides an estimate of body fat based on height and weight. Your health care provider can find your BMI and can help you achieve or maintain a healthy weight.For adults 20 years and older:  A BMI below 18.5 is considered underweight.  A BMI of 18.5 to 24.9 is normal.  A BMI of 25 to 29.9 is considered overweight.  A BMI of 30 and above is considered obese.  Maintain normal blood lipids and cholesterol levels by exercising and minimizing your intake of saturated fat. Eat a balanced diet with plenty of fruit and vegetables. Blood tests for lipids and cholesterol should begin at age 76 and be repeated every 5 years. If your lipid or cholesterol levels are high, you are over 50, or you are at high risk for heart disease, you  may need your cholesterol levels checked more frequently.Ongoing high lipid and cholesterol levels should be treated with medicines if diet and exercise are not working.  If you smoke, find out from your health care provider how to quit. If you do not use tobacco, do not start.  Lung cancer screening is recommended for adults aged 9-80 years who are at high risk for developing lung cancer because of a history of smoking. A yearly low-dose CT scan of the lungs is recommended for people who have at least a 30-pack-year history of smoking and are a current smoker or  have quit within the past 15 years. A pack year of smoking is smoking an average of 1 pack of cigarettes a day for 1 year (for example: 1 pack a day for 30 years or 2 packs a day for 15 years). Yearly screening should continue until the smoker has stopped smoking for at least 15 years. Yearly screening should be stopped for people who develop a health problem that would prevent them from having lung cancer treatment.  If you choose to drink alcohol, do not have more than 2 drinks per day. One drink is considered to be 12 ounces (355 mL) of beer, 5 ounces (148 mL) of wine, or 1.5 ounces (44 mL) of liquor.  Avoid use of street drugs. Do not share needles with anyone. Ask for help if you need support or instructions about stopping the use of drugs.  High blood pressure causes heart disease and increases the risk of stroke. Your blood pressure should be checked at least every 1-2 years. Ongoing high blood pressure should be treated with medicines, if weight loss and exercise are not effective.  If you are 81-15 years old, ask your health care provider if you should take aspirin to prevent heart disease.  Diabetes screening involves taking a blood sample to check your fasting blood sugar level. This should be done once every 3 years, after age 90, if you are within normal weight and without risk factors for diabetes. Testing should be considered at a younger age or be carried out more frequently if you are overweight and have at least 1 risk factor for diabetes.  Colorectal cancer can be detected and often prevented. Most routine colorectal cancer screening begins at the age of 18 and continues through age 57. However, your health care provider may recommend screening at an earlier age if you have risk factors for colon cancer. On a yearly basis, your health care provider may provide home test kits to check for hidden blood in the stool. Use of a small camera at the end of a tube to directly examine the colon  (sigmoidoscopy or colonoscopy) can detect the earliest forms of colorectal cancer. Talk to your health care provider about this at age 43, when routine screening begins. Direct exam of the colon should be repeated every 5-10 years through age 37, unless early forms of precancerous polyps or small growths are found.  People who are at an increased risk for hepatitis B should be screened for this virus. You are considered at high risk for hepatitis B if:  You were born in a country where hepatitis B occurs often. Talk with your health care provider about which countries are considered high risk.  Your parents were born in a high-risk country and you have not received a shot to protect against hepatitis B (hepatitis B vaccine).  You have HIV or AIDS.  You use needles to inject street drugs.  You live with, or have sex with, someone who has hepatitis B.  You are a man who has sex with other men (MSM).  You get hemodialysis treatment.  You take certain medicines for conditions such as cancer, organ transplantation, and autoimmune conditions.  Hepatitis C blood testing is recommended for all people born from [birth year] through 1965 and any individual with known risks for hepatitis C.  Practice safe sex. Use condoms and avoid high-risk sexual practices to reduce the spread of sexually transmitted infections (STIs). STIs include gonorrhea, chlamydia, syphilis, trichomonas, herpes, HPV, and human immunodeficiency virus (HIV). Herpes, HIV, and HPV are viral illnesses that have no cure. They can result in disability, cancer, and death.  If you are at risk of being infected with HIV, it is recommended that you take a prescription medicine daily to prevent HIV infection. This is called preexposure prophylaxis (PrEP). You are considered at risk if:  You are a man who has sex with other men (MSM) and have other risk factors.  You are a heterosexual man, are sexually active, and are at increased risk for HIV  infection.  You take drugs by injection.  You are sexually active with a partner who has HIV.  Talk with your health care provider about whether you are at high risk of being infected with HIV. If you choose to begin PrEP, you should first be tested for HIV. You should then be tested every 3 months for as long as you are taking PrEP.  A one-time screening for abdominal aortic aneurysm (AAA) and surgical repair of large AAAs by ultrasound are recommended for men ages 22 to 84 years who are current or former smokers.  Healthy men should no longer receive prostate-specific antigen (PSA) blood tests as part of routine cancer screening. Talk with your health care provider about prostate cancer screening.  Testicular cancer screening is not recommended for adult males who have no symptoms. Screening includes self-exam, a health care provider exam, and other screening tests. Consult with your health care provider about any symptoms you have or any concerns you have about testicular cancer.  Use sunscreen. Apply sunscreen liberally and repeatedly throughout the day. You should seek shade when your shadow is shorter than you. Protect yourself by wearing long sleeves, pants, a wide-brimmed hat, and sunglasses year round, whenever you are outdoors.  Once a month, do a whole-body skin exam, using a mirror to look at the skin on your back. Tell your health care provider about new moles, moles that have irregular borders, moles that are larger than a pencil eraser, or moles that have changed in shape or color.  Stay current with required vaccines (immunizations).  Influenza vaccine. All adults should be immunized every year.  Tetanus, diphtheria, and acellular pertussis (Td, Tdap) vaccine. An adult who has not previously received Tdap or who does not know his vaccine status should receive 1 dose of Tdap. This initial dose should be followed by tetanus and diphtheria toxoids (Td) booster doses every 10 years.  Adults with an unknown or incomplete history of completing a 3-dose immunization series with Td-containing vaccines should begin or complete a primary immunization series including a Tdap dose. Adults should receive a Td booster every 10 years.  Varicella vaccine. An adult without evidence of immunity to varicella should receive 2 doses or a second dose if he has previously received 1 dose.  Human papillomavirus (HPV) vaccine. Males aged 51-21 years who have not received the vaccine previously should receive the  3-dose series. Males aged 22-26 years may be immunized. Immunization is recommended through the age of 73 years for any male who has sex with males and did not get any or all doses earlier. Immunization is recommended for any person with an immunocompromised condition through the age of 66 years if he did not get any or all doses earlier. During the 3-dose series, the second dose should be obtained 4-8 weeks after the first dose. The third dose should be obtained 24 weeks after the first dose and 16 weeks after the second dose.  Zoster vaccine. One dose is recommended for adults aged 45 years or older unless certain conditions are present.  Measles, mumps, and rubella (MMR) vaccine. Adults born before [birth year] generally are considered immune to measles and mumps. Adults born in [birth year] or later should have 1 or more doses of MMR vaccine unless there is a contraindication to the vaccine or there is laboratory evidence of immunity to each of the three diseases. A routine second dose of MMR vaccine should be obtained at least 28 days after the first dose for students attending postsecondary schools, health care workers, or international travelers. People who received inactivated measles vaccine or an unknown type of measles vaccine during 1963-1967 should receive 2 doses of MMR vaccine. People who received inactivated mumps vaccine or an unknown type of mumps vaccine before 1979 and are at high risk for mumps  infection should consider immunization with 2 doses of MMR vaccine. Unvaccinated health care workers born before [birth year] who lack laboratory evidence of measles, mumps, or rubella immunity or laboratory confirmation of disease should consider measles and mumps immunization with 2 doses of MMR vaccine or rubella immunization with 1 dose of MMR vaccine.  Pneumococcal 13-valent conjugate (PCV13) vaccine. When indicated, a person who is uncertain of his immunization history and has no record of immunization should receive the PCV13 vaccine. An adult aged 45 years or older who has certain medical conditions and has not been previously immunized should receive 1 dose of PCV13 vaccine. This PCV13 should be followed with a dose of pneumococcal polysaccharide (PPSV23) vaccine. The PPSV23 vaccine dose should be obtained at least 8 weeks after the dose of PCV13 vaccine. An adult aged 71 years or older who has certain medical conditions and previously received 1 or more doses of PPSV23 vaccine should receive 1 dose of PCV13. The PCV13 vaccine dose should be obtained 1 or more years after the last PPSV23 vaccine dose.  Pneumococcal polysaccharide (PPSV23) vaccine. When PCV13 is also indicated, PCV13 should be obtained first. All adults aged 26 years and older should be immunized. An adult younger than age 31 years who has certain medical conditions should be immunized. Any person who resides in a nursing home or long-term care facility should be immunized. An adult smoker should be immunized. People with an immunocompromised condition and certain other conditions should receive both PCV13 and PPSV23 vaccines. People with human immunodeficiency virus (HIV) infection should be immunized as soon as possible after diagnosis. Immunization during chemotherapy or radiation therapy should be avoided. Routine use of PPSV23 vaccine is not recommended for American Indians, [place] Natives, or people younger than 65 years unless there are  medical conditions that require PPSV23 vaccine. When indicated, people who have unknown immunization and have no record of immunization should receive PPSV23 vaccine. One-time revaccination 5 years after the first dose of PPSV23 is recommended for people aged 19-64 years who have chronic kidney failure, nephrotic syndrome, asplenia, or immunocompromised  conditions. People who received 1-2 doses of PPSV23 before age 46 years should receive another dose of PPSV23 vaccine at age 71 years or later if at least 5 years have passed since the previous dose. Doses of PPSV23 are not needed for people immunized with PPSV23 at or after age 14 years.  Meningococcal vaccine. Adults with asplenia or persistent complement component deficiencies should receive 2 doses of quadrivalent meningococcal conjugate (MenACWY-D) vaccine. The doses should be obtained at least 2 months apart. Microbiologists working with certain meningococcal bacteria, [place] recruits, people at risk during an outbreak, and people who travel to or live in countries with a high rate of meningitis should be immunized. A first-year college student up through age 13 years who is living in a residence hall should receive a dose if he did not receive a dose on or after his 16th birthday. Adults who have certain high-risk conditions should receive one or more doses of vaccine.  Hepatitis A vaccine. Adults who wish to be protected from this disease, have certain high-risk conditions, work with hepatitis A-infected animals, work in hepatitis A research labs, or travel to or work in countries with a high rate of hepatitis A should be immunized. Adults who were previously unvaccinated and who anticipate close contact with an international adoptee during the first 60 days after arrival in the [place] from a country with a high rate of hepatitis A should be immunized.  Hepatitis B vaccine. Adults should be immunized if they wish to be protected from this  disease, have certain high-risk conditions, may be exposed to blood or other infectious body fluids, are household contacts or sex partners of hepatitis B positive people, are clients or workers in certain care facilities, or travel to or work in countries with a high rate of hepatitis B.  Haemophilus influenzae type b (Hib) vaccine. A previously unvaccinated person with asplenia or sickle cell disease or having a scheduled splenectomy should receive 1 dose of Hib vaccine. Regardless of previous immunization, a recipient of a hematopoietic stem cell transplant should receive a 3-dose series 6-12 months after his successful transplant. Hib vaccine is not recommended for adults with HIV infection. Preventive Service / Frequency Ages 76 to 53  Blood pressure check.** / Every 1 to 2 years.  Lipid and cholesterol check.** / Every 5 years beginning at age 71.  Hepatitis C blood test.** / For any individual with known risks for hepatitis C.  Skin self-exam. / Monthly.  Influenza vaccine. / Every year.  Tetanus, diphtheria, and acellular pertussis (Tdap, Td) vaccine.** / Consult your health care provider. 1 dose of Td every 10 years.  Varicella vaccine.** / Consult your health care provider.  HPV vaccine. / 3 doses over 6 months, if 34 or younger.  Measles, mumps, rubella (MMR) vaccine.** / You need at least 1 dose of MMR if you were born in 1957 or later. You may also need a second dose.  Pneumococcal 13-valent conjugate (PCV13) vaccine.** / Consult your health care provider.  Pneumococcal polysaccharide (PPSV23) vaccine.** / 1 to 2 doses if you smoke cigarettes or if you have certain conditions.  Meningococcal vaccine.** / 1 dose if you are age 19 to 59 years and a Market researcher living in a residence hall, or have one of several medical conditions. You may also need additional booster doses.  Hepatitis A vaccine.** / Consult your health care provider.  Hepatitis B vaccine.** /  Consult your health care provider.  Haemophilus influenzae type b (  Hib) vaccine.** / Consult your health care provider. Ages 53 to 70  Blood pressure check.** / Every 1 to 2 years.  Lipid and cholesterol check.** / Every 5 years beginning at age 77.  Lung cancer screening. / Every year if you are aged 43-80 years and have a 30-pack-year history of smoking and currently smoke or have quit within the past 15 years. Yearly screening is stopped once you have quit smoking for at least 15 years or develop a health problem that would prevent you from having lung cancer treatment.  Fecal occult blood test (FOBT) of stool. / Every year beginning at age 14 and continuing until age 31. You may not have to do this test if you get a colonoscopy every 10 years.  Flexible sigmoidoscopy** or colonoscopy.** / Every 5 years for a flexible sigmoidoscopy or every 10 years for a colonoscopy beginning at age 60 and continuing until age 83.  Hepatitis C blood test.** / For all people born from [birth year] through 1963 and any individual with known risks for hepatitis C.  Skin self-exam. / Monthly.  Influenza vaccine. / Every year.  Tetanus, diphtheria, and acellular pertussis (Tdap/Td) vaccine.** / Consult your health care provider. 1 dose of Td every 10 years.  Varicella vaccine.** / Consult your health care provider.  Zoster vaccine.** / 1 dose for adults aged 64 years or older.  Measles, mumps, rubella (MMR) vaccine.** / You need at least 1 dose of MMR if you were born in 1957 or later. You may also need a second dose.  Pneumococcal 13-valent conjugate (PCV13) vaccine.** / Consult your health care provider.  Pneumococcal polysaccharide (PPSV23) vaccine.** / 1 to 2 doses if you smoke cigarettes or if you have certain conditions.  Meningococcal vaccine.** / Consult your health care provider.  Hepatitis A vaccine.** / Consult your health care provider.  Hepatitis B vaccine.** / Consult your health care  provider.  Haemophilus influenzae type b (Hib) vaccine.** / Consult your health care provider. Ages 34 and over  Blood pressure check.** / Every 1 to 2 years.  Lipid and cholesterol check.**/ Every 5 years beginning at age 62.  Lung cancer screening. / Every year if you are aged 16-80 years and have a 30-pack-year history of smoking and currently smoke or have quit within the past 15 years. Yearly screening is stopped once you have quit smoking for at least 15 years or develop a health problem that would prevent you from having lung cancer treatment.  Fecal occult blood test (FOBT) of stool. / Every year beginning at age 42 and continuing until age 45. You may not have to do this test if you get a colonoscopy every 10 years.  Flexible sigmoidoscopy** or colonoscopy.** / Every 5 years for a flexible sigmoidoscopy or every 10 years for a colonoscopy beginning at age 53 and continuing until age 27.  Hepatitis C blood test.** / For all people born from [birth year] through 1963 and any individual with known risks for hepatitis C.  Abdominal aortic aneurysm (AAA) screening.** / A one-time screening for ages 44 to 66 years who are current or former smokers.  Skin self-exam. / Monthly.  Influenza vaccine. / Every year.  Tetanus, diphtheria, and acellular pertussis (Tdap/Td) vaccine.** / 1 dose of Td every 10 years.  Varicella vaccine.** / Consult your health care provider.  Zoster vaccine.** / 1 dose for adults aged 72 years or older.  Pneumococcal 13-valent conjugate (PCV13) vaccine.** / Consult your health care provider.  Pneumococcal polysaccharide (  PPSV23) vaccine.** / 1 dose for all adults aged 22 years and older.  Meningococcal vaccine.** / Consult your health care provider.  Hepatitis A vaccine.** / Consult your health care provider.  Hepatitis B vaccine.** / Consult your health care provider.  Haemophilus influenzae type b (Hib) vaccine.** / Consult your health care  provider. **Family history and personal history of risk and conditions may change your health care provider's recommendations. Document Released: 03/24/2001 Document Revised: 01/31/2013 Document Reviewed: 06/23/2010 [facility] Patient Information 2015 [place], [place]. This information is not intended to replace advice given to you by your health care provider. Make sure you discuss any questions you have with your health care provider.

## 2014-11-09 NOTE — Assessment & Plan Note (Signed)
On Lyrica, Cymbalta DM control helps

## 2014-11-09 NOTE — Progress Notes (Signed)
Pre visit review using our clinic review tool, if applicable. No additional management support is needed unless otherwise documented below in the visit note. 

## 2014-11-09 NOTE — Progress Notes (Signed)
Subjective:  Patient ID: Clinton Kirby, male    DOB: 21-Apr-1945  Age: 69 y.o. MRN: [id number]  CC: Medicare Wellness   HPI Clinton Kirby presents for a well exam. F/u DM, HTN, CAD  Outpatient Prescriptions Prior to Visit  Medication Sig Dispense Refill  . aspirin 325 MG tablet Take 325 mg by mouth daily.    [name] atorvastatin (LIPITOR) 40 MG tablet Take 1 tablet (40 mg total) by mouth daily. 30 tablet 3  . clotrimazole-betamethasone (LOTRISONE) cream APPLY TOPICALLY TO AFFECTED AREA TWICE DAILY FOR A MONTH 45 g 0  . glimepiride (AMARYL) 4 MG tablet Take 2 tablets daily 60 tablet 3  . glucose blood test strip Check blood sugars twice a day 100 each 12  . INVOKANA 300 MG TABS tablet TAKE ONE TABLET BY MOUTH ONCE DAILY BEFORE BREAKFAST 30 tablet 0  . losartan (COZAAR) 100 MG tablet Take 1 tablet (100 mg total) by mouth daily. 30 tablet 11  . metFORMIN (GLUCOPHAGE) 1000 MG tablet TAKE ONE TABLET BY MOUTH TWICE DAILY WITH MEALS 60 tablet 0  . metoprolol succinate (TOPROL-XL) 50 MG 24 hr tablet Take 1 tablet (50 mg total) by mouth daily. 30 tablet 11  . ODORLESS GARLIC PO Take by mouth.    . pregabalin (LYRICA) 50 MG capsule Take 1 capsule (50 mg total) by mouth daily. 30 capsule 2  . DULoxetine (CYMBALTA) 60 MG capsule Take 1 capsule (60 mg total) by mouth daily. (Patient not taking: Reported on 11/09/2014) 30 capsule 5  . empagliflozin (JARDIANCE) 25 MG TABS tablet Take 25 mg by mouth daily. (Patient not taking: Reported on 11/09/2014) 30 tablet 2   No facility-administered medications prior to visit.    ROS Review of Systems  Constitutional: Positive for unexpected weight change. Negative for appetite change and fatigue.  HENT: Negative for congestion, nosebleeds, sneezing, sore throat and trouble swallowing.   Eyes: Negative for itching and visual disturbance.  Respiratory: Negative for cough.   Cardiovascular: Negative for chest pain, palpitations and leg swelling.    Gastrointestinal: Negative for nausea, diarrhea, blood in stool and abdominal distention.  Genitourinary: Negative for frequency and hematuria.  Musculoskeletal: Negative for back pain, joint swelling, gait problem and neck pain.  Skin: Negative for rash.  Neurological: Negative for dizziness, tremors, speech difficulty and weakness.  Psychiatric/Behavioral: Negative for sleep disturbance, dysphoric mood and agitation. The patient is not nervous/anxious.     Objective:  BP 122/80 mmHg  Pulse 90  Ht 6' (1.829 m)  Wt 239 lb 12 oz (108.75 kg)  BMI 32.51 kg/m2  SpO2 95%  BP Readings from Last 3 Encounters:  11/09/14 122/80  10/08/14 169/106  04/19/14 131/80    Wt Readings from Last 3 Encounters:  11/09/14 239 lb 12 oz (108.75 kg)  04/19/14 233 lb (105.688 kg)  01/09/14 230 lb 9.6 oz (104.599 kg)    Physical Exam  Constitutional: He is oriented to person, place, and time. He appears well-developed and well-nourished. No distress.  HENT:  Head: Normocephalic and atraumatic.  Right Ear: External ear normal.  Left Ear: External ear normal.  Nose: Nose normal.  Mouth/Throat: Oropharynx is clear and moist. No oropharyngeal exudate.  Eyes: Conjunctivae and EOM are normal. Pupils are equal, round, and reactive to light. Right eye exhibits no discharge. Left eye exhibits no discharge. No scleral icterus.  Neck: Normal range of motion. Neck supple. No JVD present. No tracheal deviation present. No thyromegaly present.  Cardiovascular: Normal rate, regular  rhythm, normal heart sounds and intact distal pulses.  Exam reveals no gallop and no friction rub.   No murmur heard. Pulmonary/Chest: Effort normal and breath sounds normal. No stridor. No respiratory distress. He has no wheezes. He has no rales. He exhibits no tenderness.  Abdominal: Soft. Bowel sounds are normal. He exhibits no distension and no mass. There is no tenderness. There is no rebound and no guarding.  Musculoskeletal:  Normal range of motion. He exhibits no edema or tenderness.  Lymphadenopathy:    He has no cervical adenopathy.  Neurological: He is alert and oriented to person, place, and time. He has normal reflexes. No cranial nerve deficit. He exhibits normal muscle tone. Coordination normal.  Skin: Skin is warm and dry. No rash noted. He is not diaphoretic. No erythema. No pallor.  Psychiatric: He has a normal mood and affect. His behavior is normal. Judgment and thought content normal.  Obese Pt declined rectal exam  Lab Results  Component Value Date   GLUCOSE 195* 01/08/2014   CHOL 248* 08/29/2013   TRIG 249.0* 08/29/2013   HDL 45.60 08/29/2013   LDLDIRECT 115.4 05/18/2011   LDLCALC 153* 08/29/2013   ALT 22 01/08/2014   AST 21 01/08/2014   NA 133* 01/08/2014   K 4.1 01/08/2014   CL 100 01/08/2014   CREATININE 0.9 01/08/2014   BUN 17 01/08/2014   CO2 23 01/08/2014   TSH 0.97 08/26/2012   HGBA1C 12.7* 04/16/2014   MICROALBUR 1.0 01/08/2014    No results found.  Assessment & Plan:   There are no diagnoses linked to this encounter. I am having [name] maintain his ODORLESS GARLIC PO, aspirin, metoprolol succinate, losartan, DULoxetine, glucose blood, glimepiride, clotrimazole-betamethasone, atorvastatin, empagliflozin, INVOKANA, metFORMIN, pregabalin, clindamycin, and HYDROcodone-acetaminophen.  Meds ordered this encounter  Medications  . clindamycin (CLEOCIN) 300 MG capsule    Sig:   . HYDROcodone-acetaminophen (NORCO/VICODIN) 5-325 MG tablet    Sig:      Follow-up: No Follow-up on file.  Walker Kehr, MD

## 2014-11-10 ENCOUNTER — Other Ambulatory Visit: Payer: Self-pay | Admitting: Endocrinology

## 2014-11-12 ENCOUNTER — Other Ambulatory Visit: Payer: Self-pay | Admitting: Internal Medicine

## 2014-11-12 ENCOUNTER — Telehealth: Payer: Self-pay | Admitting: Internal Medicine

## 2014-11-12 DIAGNOSIS — N390 Urinary tract infection, site not specified: Secondary | ICD-10-CM

## 2014-11-12 DIAGNOSIS — R972 Elevated prostate specific antigen [PSA]: Secondary | ICD-10-CM

## 2014-11-12 MED ORDER — CIPROFLOXACIN HCL 500 MG PO TABS: 500.0000 mg | ORAL_TABLET | Freq: Two times a day (BID) | ORAL | Status: DC

## 2014-11-12 NOTE — Telephone Encounter (Signed)
Pt called in and would like a nurse to call him back about his labs that was done on 9/30   Best number is is cell number

## 2014-11-14 ENCOUNTER — Telehealth: Payer: Self-pay | Admitting: *Deleted

## 2014-11-14 ENCOUNTER — Other Ambulatory Visit: Payer: Self-pay | Admitting: Internal Medicine

## 2014-11-14 ENCOUNTER — Other Ambulatory Visit: Payer: Self-pay | Admitting: Endocrinology

## 2014-11-14 NOTE — Telephone Encounter (Signed)
Noted  

## 2014-11-14 NOTE — Telephone Encounter (Signed)
Needs to be seen for refills

## 2014-11-14 NOTE — Telephone Encounter (Signed)
Patient is requesting refills of his prescriptions, these were denied because his last visit was in March and he has no showed and cancelled his last two.  He needs an appointment for refills to be given.

## 2014-11-15 ENCOUNTER — Telehealth: Payer: Self-pay | Admitting: Physician Assistant

## 2014-11-15 ENCOUNTER — Encounter: Payer: Self-pay | Admitting: *Deleted

## 2014-11-15 ENCOUNTER — Encounter: Payer: Self-pay | Admitting: Physician Assistant

## 2014-11-15 ENCOUNTER — Ambulatory Visit (INDEPENDENT_AMBULATORY_CARE_PROVIDER_SITE_OTHER): Payer: Medicare Other | Admitting: Physician Assistant

## 2014-11-15 ENCOUNTER — Other Ambulatory Visit: Payer: Self-pay | Admitting: *Deleted

## 2014-11-15 VITALS — BP 132/86 | HR 98 | Ht 72.0 in | Wt 236.9 lb

## 2014-11-15 DIAGNOSIS — E785 Hyperlipidemia, unspecified: Secondary | ICD-10-CM | POA: Diagnosis not present

## 2014-11-15 DIAGNOSIS — I10 Essential (primary) hypertension: Secondary | ICD-10-CM

## 2014-11-15 DIAGNOSIS — I2510 Atherosclerotic heart disease of native coronary artery without angina pectoris: Secondary | ICD-10-CM

## 2014-11-15 MED ORDER — CANAGLIFLOZIN 300 MG PO TABS: 300.0000 mg | ORAL_TABLET | Freq: Every day | ORAL | Status: DC

## 2014-11-15 MED ORDER — METFORMIN HCL 1000 MG PO TABS: 1000.0000 mg | ORAL_TABLET | Freq: Two times a day (BID) | ORAL | Status: DC

## 2014-11-15 NOTE — Progress Notes (Signed)
Patient ID: Clinton Kirby, male   DOB: Jun 16, 1945, 69 y.o.   MRN: [id number]    Date:  11/15/2014   ID:  [name], [name] 07-29-1945, MRN [id number]  PCP:  Walker Kehr, MD  Primary Cardiologist:  [place]  Chief Complaint  Patient presents with  . Follow-up    clearance for DOT physical//pt c/o swelling in bilateral feet     History of Present Illness: Clinton Kirby is a 69 y.o. male with known history of coronary disease and pearly poorly controlled diabetes mellitus type 2 as well as hyperlipidemia, hypertension and peripheral neuropathy.  He is status post inferior myocardial infarction in February of 2004 while in [place]. This was treated with a drug-eluting stent to the right coronary. In 2005 he had some abnormality on a stress test and underwent repeat cardiac catheterization. This showed the stent to be widely patent and he had no other significant disease. His last stress test was in July 2014 and is noted below.  He is a Administrator.  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 He reports doing very well with no particular complaints. He currently denies nausea, vomiting, fever, chest pain, shortness of breath, orthopnea, dizziness, PND, cough, congestion, abdominal pain, hematochezia, melena, lower extremity edema, claudication.  Wt Readings from Last 3 Encounters:  11/15/14 107.457 kg (236 lb 14.4 oz)  11/09/14 108.75 kg (239 lb 12 oz)  04/19/14 105.688 kg (233 lb)     Past Medical History  Diagnosis Date  . CAD (coronary artery disease)   . Diabetes mellitus type II   . HTN (hypertension)   . ED (erectile dysfunction)   . Balanitis   . Hyperlipidemia   . Old MI (myocardial infarction) 12/07/2013    Current Outpatient Prescriptions  Medication Sig Dispense Refill  . aspirin 325 MG tablet Take 325 mg by mouth daily.    [name] atorvastatin (LIPITOR) 40 MG tablet Take 1 tablet (40 mg total) by mouth daily. 30 tablet 3  . ciprofloxacin  (CIPRO) 500 MG tablet Take 1 tablet (500 mg total) by mouth 2 (two) times daily. 28 tablet 0  . clotrimazole-betamethasone (LOTRISONE) cream Apply topically 2 (two) times daily. 45 g 3  . glimepiride (AMARYL) 4 MG tablet Take 2 tablets daily 60 tablet 3  . glucose blood (ONETOUCH VERIO) test strip Use as instructed 50 each 11  . INVOKANA 300 MG TABS tablet TAKE ONE TABLET BY MOUTH ONCE DAILY BEFORE BREAKFAST 30 tablet 0  . losartan (COZAAR) 100 MG tablet Take 1 tablet (100 mg total) by mouth daily. 30 tablet 11  . metFORMIN (GLUCOPHAGE) 1000 MG tablet Take 1 tablet (1,000 mg total) by mouth 2 (two) times daily with a meal. 60 tablet 0  . metoprolol succinate (TOPROL-XL) 50 MG 24 hr tablet Take 1 tablet (50 mg total) by mouth daily. 30 tablet 11  . ODORLESS GARLIC PO Take by mouth.    Glory Rosebush DELICA LANCETS FINE MISC 1 Device by Does not apply route daily as needed. 100 each 3  . pregabalin (LYRICA) 50 MG capsule Take 1 capsule (50 mg total) by mouth daily. 30 capsule 2   No current facility-administered medications for this visit.    Allergies:   No Known Allergies  Social History:  The patient  reports that he has never smoked. He does not have any smokeless tobacco history on file. He reports that he does not drink alcohol or use illicit drugs.   Family history:  Family History  Problem Relation Age of Onset  . Diabetes Other   . Diabetes Mother   . Cancer Father     ?    ROS:  Please see the history of present illness.  All other systems reviewed and negative.   PHYSICAL EXAM: VS:  BP 132/86 mmHg  Pulse 98  Ht 6' (1.829 m)  Wt 107.457 kg (236 lb 14.4 oz)  BMI 32.12 kg/m2 Obese well developed, in no acute distress HEENT: Pupils are equal round react to light accommodation extraocular movements are intact.  Neck: no JVDNo cervical lymphadenopathy. Cardiac: Regular rate and rhythm without murmurs rubs or gallops. Lungs:  clear to auscultation bilaterally, no wheezing,  rhonchi or rales Abd: soft, nontender, positive bowel sounds all quadrants, no hepatosplenomegaly Ext: no lower extremity edema.  2+ radial and dorsalis pedis pulses. Skin: warm and dry Neuro:  Grossly normal  EKG:  Sinus rhythm right bundle branch block rate 90 bpm. Changes from prior  Lipid Panel     Component Value Date/Time   CHOL 195 11/09/2014 1543   TRIG 203.0* 11/09/2014 1543   HDL 51.40 11/09/2014 1543   CHOLHDL 4 11/09/2014 1543   VLDL 40.6* 11/09/2014 1543   LDLCALC 153* 08/29/2013 1111   LDLDIRECT 136.0 11/09/2014 1543      ASSESSMENT AND PLAN:  Problem List Items Addressed This Visit    Essential hypertension - Primary   Dyslipidemia   Coronary atherosclerosis     Coronary artery disease: Status post inferior myocardial infarction February 2004 treated with direct stenting of the right coronary with a drug-eluting stent. Patient is asymptomatic. Stress Myoview from July 2014 showed inferior scar without ischemia. EF 43%. Continue medical therapy with ASA, statin, and metoprolol. 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  Hypertension:  Controlled  Diabetes mellitus: Managed by Dr. Dwyane Dee  Hyperlipidemia:  Poorly controlled LDL is 153 total cholesterol of 195 triglycerides of 203 HDL 51.4.  He is on Lipitor however not sure if he taking it every day and asked him to do this and he will recheck his cholesterol 3 months at which time I suspect we may need to increase his dose

## 2014-11-15 NOTE — Telephone Encounter (Signed)
Pt's wife informed of below.    Erline Levine, please, inform patient that all labs are ok except for a UTI and elevated PSA (prostate test). Take abx for UTI (cipro). Repeat tests (UA, PSA) in 6 weeks. Diabetic test is better. Thx

## 2014-11-15 NOTE — Telephone Encounter (Signed)
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  Thanks

## 2014-11-15 NOTE — Telephone Encounter (Signed)
Spoke to [place] and Tarri Fuller about this.  Refaxed new letter w/ signature & same wording from last year's clearance letter.  Informed pt this was sent to requesting provider.

## 2014-11-15 NOTE — Patient Instructions (Addendum)
Your physician recommends that you return for fasting lab work in: 3 months.  Your physician recommends that you schedule a follow-up appointment in: 1 year with Dr. Martinique.

## 2014-11-15 NOTE — Telephone Encounter (Signed)
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 The fax number is [phone]. Please f/u with him for any further questions. He states that the notes he was given by the nurse today WILL NOT suffice.   Thanks

## 2014-11-19 ENCOUNTER — Telehealth: Payer: Self-pay | Admitting: *Deleted

## 2014-11-19 ENCOUNTER — Other Ambulatory Visit: Payer: Self-pay | Admitting: Endocrinology

## 2014-11-19 NOTE — Telephone Encounter (Signed)
Pharmacy was requesting a refill of Glimeperide, Per Dr. Dwyane Dee patient needs appointment before refills will be given.

## 2014-11-19 NOTE — Telephone Encounter (Signed)
Patient need refill of glimepiride (AMARYL) 4 MG tablet.

## 2014-11-20 ENCOUNTER — Other Ambulatory Visit: Payer: Self-pay | Admitting: Endocrinology

## 2015-01-08 ENCOUNTER — Ambulatory Visit: Payer: Medicare Other | Admitting: Podiatry

## 2015-01-10 ENCOUNTER — Ambulatory Visit: Payer: Medicare Other | Admitting: Podiatry

## 2015-02-06 ENCOUNTER — Encounter: Payer: Self-pay | Admitting: Podiatry

## 2015-02-06 ENCOUNTER — Ambulatory Visit (INDEPENDENT_AMBULATORY_CARE_PROVIDER_SITE_OTHER): Payer: Medicare Other | Admitting: Podiatry

## 2015-02-06 VITALS — BP 143/88 | HR 97

## 2015-02-06 DIAGNOSIS — M79606 Pain in leg, unspecified: Secondary | ICD-10-CM

## 2015-02-06 DIAGNOSIS — E0842 Diabetes mellitus due to underlying condition with diabetic polyneuropathy: Secondary | ICD-10-CM

## 2015-02-06 DIAGNOSIS — B351 Tinea unguium: Secondary | ICD-10-CM | POA: Diagnosis not present

## 2015-02-06 NOTE — Patient Instructions (Signed)
Seen for hypertrophic nails. All nails debrided. Return in 3 months or as needed.  

## 2015-02-06 NOTE — Progress Notes (Signed)
SUBJECTIVE: 69 y.o. year old male presents for diabetic foot care. Patient requests toe nails trimmed.  Blood sugar was 159. Working full time driving truck.   OBJECTIVE: DERMATOLOGIC EXAMINATION: Nails: Thick dystrophic nails x 10.  VASCULAR EXAMINATION OF LOWER LIMBS: Pedal pulses: All pedal pulses are palpable on right and not palpable on left both DP and PT.  NEUROLOGIC EXAMINATION OF THE LOWER LIMBS: Failed to respond to Monofilament sensory testing bilateral.  MUSCULOSKELETAL EXAMINATION: No gross deformities.   ASSESSMENT: Diabetic neuropathy.  Rest pain in calf. Onychomycosis.   PLAN: Reviewed findings. Discussed vascular consultation for the rest pain. Patient will return to Dr. Delora Fuel to get it checked.  All nails debrided.

## 2015-02-08 ENCOUNTER — Ambulatory Visit: Payer: Medicare Other | Admitting: Internal Medicine

## 2015-02-20 ENCOUNTER — Ambulatory Visit: Payer: Medicare Other | Admitting: Internal Medicine

## 2015-02-25 ENCOUNTER — Encounter: Payer: Self-pay | Admitting: Internal Medicine

## 2015-02-25 ENCOUNTER — Ambulatory Visit (INDEPENDENT_AMBULATORY_CARE_PROVIDER_SITE_OTHER): Payer: Medicare Other | Admitting: Internal Medicine

## 2015-02-25 VITALS — BP 100/70 | HR 97 | Wt 236.0 lb

## 2015-02-25 DIAGNOSIS — I2510 Atherosclerotic heart disease of native coronary artery without angina pectoris: Secondary | ICD-10-CM | POA: Diagnosis not present

## 2015-02-25 DIAGNOSIS — Z23 Encounter for immunization: Secondary | ICD-10-CM | POA: Diagnosis not present

## 2015-02-25 DIAGNOSIS — E1165 Type 2 diabetes mellitus with hyperglycemia: Secondary | ICD-10-CM

## 2015-02-25 DIAGNOSIS — R5382 Chronic fatigue, unspecified: Secondary | ICD-10-CM

## 2015-02-25 DIAGNOSIS — R972 Elevated prostate specific antigen [PSA]: Secondary | ICD-10-CM

## 2015-02-25 DIAGNOSIS — E785 Hyperlipidemia, unspecified: Secondary | ICD-10-CM | POA: Diagnosis not present

## 2015-02-25 DIAGNOSIS — I10 Essential (primary) hypertension: Secondary | ICD-10-CM

## 2015-02-25 DIAGNOSIS — E1140 Type 2 diabetes mellitus with diabetic neuropathy, unspecified: Secondary | ICD-10-CM

## 2015-02-25 DIAGNOSIS — IMO0002 Reserved for concepts with insufficient information to code with codable children: Secondary | ICD-10-CM

## 2015-02-25 MED ORDER — ATORVASTATIN CALCIUM 40 MG PO TABS: 40.0000 mg | ORAL_TABLET | Freq: Every day | ORAL | Status: DC

## 2015-02-25 MED ORDER — PREGABALIN 50 MG PO CAPS: 50.0000 mg | ORAL_CAPSULE | Freq: Every day | ORAL | Status: DC

## 2015-02-25 MED ORDER — VITAMIN D 1000 UNITS PO TABS: 1000.0000 [IU] | ORAL_TABLET | Freq: Every day | ORAL | Status: DC

## 2015-02-25 MED ORDER — CANAGLIFLOZIN 300 MG PO TABS: 300.0000 mg | ORAL_TABLET | Freq: Every day | ORAL | Status: DC

## 2015-02-25 MED ORDER — METFORMIN HCL 1000 MG PO TABS: 1000.0000 mg | ORAL_TABLET | Freq: Two times a day (BID) | ORAL | Status: DC

## 2015-02-25 NOTE — Assessment & Plan Note (Signed)
   On Invokana  Pt stopped  Losartan, Toprol

## 2015-02-25 NOTE — Assessment & Plan Note (Signed)
Free PSA 

## 2015-02-25 NOTE — Assessment & Plan Note (Addendum)
Better - not taking Amaryl Labs Taking Metformin qd - not bid due to loose

## 2015-02-25 NOTE — Assessment & Plan Note (Signed)
Invokana, Lipitor, ASA

## 2015-02-25 NOTE — Progress Notes (Signed)
Subjective:  Patient ID: Clinton Kirby, male    DOB: [birth year]  Age: 70 y.o. MRN: [id number]  CC: No chief complaint on file.   HPI Clinton Kirby presents for DM2, HTN, dyslipidemia f/u. Taking Metformin qd - not bid due to loose stools  Outpatient Prescriptions Prior to Visit  Medication Sig Dispense Refill  . aspirin 325 MG tablet Take 325 mg by mouth daily.    . canagliflozin (INVOKANA) 300 MG TABS tablet Take 300 mg by mouth daily before breakfast. 30 tablet 11  . metFORMIN (GLUCOPHAGE) 1000 MG tablet Take 1 tablet (1,000 mg total) by mouth 2 (two) times daily with a meal. 60 tablet 11  . metoprolol succinate (TOPROL-XL) 50 MG 24 hr tablet Take 1 tablet (50 mg total) by mouth daily. 30 tablet 11  . pregabalin (LYRICA) 50 MG capsule Take 1 capsule (50 mg total) by mouth daily. 30 capsule 2  . glucose blood (ONETOUCH VERIO) test strip Use as instructed (Patient not taking: Reported on 02/25/2015) 50 each 11  . ONETOUCH DELICA LANCETS FINE MISC 1 Device by Does not apply route daily as needed. (Patient not taking: Reported on 02/25/2015) 100 each 3  . atorvastatin (LIPITOR) 40 MG tablet Take 1 tablet (40 mg total) by mouth daily. (Patient not taking: Reported on 02/25/2015) 30 tablet 3  . ciprofloxacin (CIPRO) 500 MG tablet Take 1 tablet (500 mg total) by mouth 2 (two) times daily. (Patient not taking: Reported on 02/25/2015) 28 tablet 0  . clotrimazole-betamethasone (LOTRISONE) cream Apply topically 2 (two) times daily. (Patient not taking: Reported on 02/25/2015) 45 g 3  . glimepiride (AMARYL) 4 MG tablet Take 2 tablets daily (Patient not taking: Reported on 02/25/2015) 60 tablet 3  . losartan (COZAAR) 100 MG tablet Take 1 tablet (100 mg total) by mouth daily. (Patient not taking: Reported on 02/25/2015) 30 tablet 11  . ODORLESS GARLIC PO Take by mouth. Reported on 02/25/2015     No facility-administered medications prior to visit.    ROS Review of Systems  Constitutional:  Negative for appetite change, fatigue and unexpected weight change.  HENT: Negative for congestion, nosebleeds, sneezing, sore throat and trouble swallowing.   Eyes: Negative for itching and visual disturbance.  Respiratory: Negative for cough.   Cardiovascular: Negative for chest pain, palpitations and leg swelling.  Gastrointestinal: Negative for nausea, diarrhea, blood in stool and abdominal distention.  Genitourinary: Negative for frequency and hematuria.  Musculoskeletal: Negative for back pain, joint swelling, gait problem and neck pain.  Skin: Negative for rash.  Neurological: Negative for dizziness, tremors, speech difficulty and weakness.  Psychiatric/Behavioral: Negative for suicidal ideas, sleep disturbance, dysphoric mood and agitation. The patient is not nervous/anxious.     Objective:  BP 100/70 mmHg  Pulse 97  Wt 236 lb (107.049 kg)  SpO2 95%  BP Readings from Last 3 Encounters:  02/25/15 100/70  02/06/15 143/88  11/15/14 132/86    Wt Readings from Last 3 Encounters:  02/25/15 236 lb (107.049 kg)  11/15/14 236 lb 14.4 oz (107.457 kg)  11/09/14 239 lb 12 oz (108.75 kg)    Physical Exam  Constitutional: He is oriented to person, place, and time. He appears well-developed. No distress.  NAD  HENT:  Mouth/Throat: Oropharynx is clear and moist.  Eyes: Conjunctivae are normal. Pupils are equal, round, and reactive to light.  Neck: Normal range of motion. No JVD present. No thyromegaly present.  Cardiovascular: Normal rate, regular rhythm, normal heart sounds and intact distal  pulses.  Exam reveals no gallop and no friction rub.   No murmur heard. Pulmonary/Chest: Effort normal and breath sounds normal. No respiratory distress. He has no wheezes. He has no rales. He exhibits no tenderness.  Abdominal: Soft. Bowel sounds are normal. He exhibits no distension and no mass. There is no tenderness. There is no rebound and no guarding.  Musculoskeletal: Normal range of  motion. He exhibits no edema or tenderness.  Lymphadenopathy:    He has no cervical adenopathy.  Neurological: He is alert and oriented to person, place, and time. He has normal reflexes. No cranial nerve deficit. He exhibits normal muscle tone. He displays a negative Romberg sign. Coordination and gait normal.  Skin: Skin is warm and dry. No rash noted.  Psychiatric: He has a normal mood and affect. His behavior is normal. Judgment and thought content normal.    Lab Results  Component Value Date   WBC 7.6 11/09/2014   HGB 16.7 11/09/2014   HCT 49.5 11/09/2014   PLT 303.0 11/09/2014   GLUCOSE 84 11/09/2014   CHOL 195 11/09/2014   TRIG 203.0* 11/09/2014   HDL 51.40 11/09/2014   LDLDIRECT 136.0 11/09/2014   LDLCALC 153* 08/29/2013   ALT 18 11/09/2014   AST 14 11/09/2014   NA 141 11/09/2014   K 4.4 11/09/2014   CL 105 11/09/2014   CREATININE 0.91 11/09/2014   BUN 19 11/09/2014   CO2 31 11/09/2014   TSH 0.72 11/09/2014   PSA 41.53* 11/09/2014   HGBA1C 8.4* 11/09/2014   MICROALBUR 1.0 01/08/2014    No results found.  Assessment & Plan:   Diagnoses and all orders for this visit:  Uncontrolled type 2 diabetes mellitus with diabetic neuropathy, without long-term current use of insulin ([place]) -     Basic metabolic panel; Future -     Hemoglobin A1c; Future  Dyslipidemia  Essential hypertension  Atherosclerosis of native coronary artery of native heart without angina pectoris  Chronic fatigue  Elevated PSA -     PSA, total and free; Future  Need for influenza vaccination -     Flu vaccine HIGH DOSE PF  Other orders -     canagliflozin (INVOKANA) 300 MG TABS tablet; Take 300 mg by mouth daily before breakfast. -     metFORMIN (GLUCOPHAGE) 1000 MG tablet; Take 1 tablet (1,000 mg total) by mouth 2 (two) times daily with a meal. -     Cancel: metoprolol succinate (TOPROL-XL) 50 MG 24 hr tablet; Take 1 tablet (50 mg total) by mouth daily. -     atorvastatin (LIPITOR) 40  MG tablet; Take 1 tablet (40 mg total) by mouth daily. -     Cancel: losartan (COZAAR) 100 MG tablet; Take 1 tablet (100 mg total) by mouth daily. -     cholecalciferol (VITAMIN D) 1000 units tablet; Take 1 tablet (1,000 Units total) by mouth daily. -     pregabalin (LYRICA) 50 MG capsule; Take 1 capsule (50 mg total) by mouth daily.   I have discontinued Mr. Bek ODORLESS GARLIC PO, metoprolol succinate, losartan, glimepiride, clotrimazole-betamethasone, and ciprofloxacin. I am also having him start on cholecalciferol. Additionally, I am having him maintain his aspirin, glucose blood, ONETOUCH DELICA LANCETS FINE, canagliflozin, metFORMIN, atorvastatin, and pregabalin.  Meds ordered this encounter  Medications  . canagliflozin (INVOKANA) 300 MG TABS tablet    Sig: Take 300 mg by mouth daily before breakfast.    Dispense:  30 tablet    Refill:  11  No further refills given, patient needs appointment  . metFORMIN (GLUCOPHAGE) 1000 MG tablet    Sig: Take 1 tablet (1,000 mg total) by mouth 2 (two) times daily with a meal.    Dispense:  60 tablet    Refill:  11  . atorvastatin (LIPITOR) 40 MG tablet    Sig: Take 1 tablet (40 mg total) by mouth daily.    Dispense:  30 tablet    Refill:  3  . cholecalciferol (VITAMIN D) 1000 units tablet    Sig: Take 1 tablet (1,000 Units total) by mouth daily.    Dispense:  100 tablet    Refill:  3  . pregabalin (LYRICA) 50 MG capsule    Sig: Take 1 capsule (50 mg total) by mouth daily.    Dispense:  30 capsule    Refill:  5     Follow-up: Return in about 4 months (around 06/25/2015) for a follow-up visit.  Walker Kehr, MD

## 2015-02-25 NOTE — Assessment & Plan Note (Signed)
On Lipitor and ASA 

## 2015-02-25 NOTE — Progress Notes (Signed)
Pre visit review using our clinic review tool, if applicable. No additional management support is needed unless otherwise documented below in the visit note. 

## 2015-02-25 NOTE — Assessment & Plan Note (Signed)
1/17 working 4 d per mo - better

## 2015-02-27 ENCOUNTER — Telehealth: Payer: Self-pay | Admitting: *Deleted

## 2015-02-27 NOTE — Telephone Encounter (Signed)
Called pt no answer LMOM RTC.../lmb 

## 2015-02-27 NOTE — Telephone Encounter (Signed)
Pt received call pt states the Invokana that md rx on Monday med is too expensive even with his insurance med cost over $250, and he can't afford. Pt state he did not get med fill, if md can rx cheaper alternative...Clinton Kirby

## 2015-02-27 NOTE — Telephone Encounter (Signed)
Can he check w/his insurance what they would cover in place of Invokana? Thx

## 2015-02-28 DIAGNOSIS — M2041 Other hammer toe(s) (acquired), right foot: Secondary | ICD-10-CM | POA: Diagnosis not present

## 2015-02-28 DIAGNOSIS — E1142 Type 2 diabetes mellitus with diabetic polyneuropathy: Secondary | ICD-10-CM | POA: Diagnosis not present

## 2015-02-28 DIAGNOSIS — M2042 Other hammer toe(s) (acquired), left foot: Secondary | ICD-10-CM | POA: Diagnosis not present

## 2015-03-01 NOTE — Telephone Encounter (Signed)
Notified pt with md response.../lmb 

## 2015-03-01 NOTE — Telephone Encounter (Signed)
Patient called back.  States that he has to meet a deductible before insurance will cover med.

## 2015-03-04 DIAGNOSIS — I2510 Atherosclerotic heart disease of native coronary artery without angina pectoris: Secondary | ICD-10-CM | POA: Diagnosis not present

## 2015-03-04 DIAGNOSIS — E1142 Type 2 diabetes mellitus with diabetic polyneuropathy: Secondary | ICD-10-CM | POA: Diagnosis not present

## 2015-03-04 DIAGNOSIS — I509 Heart failure, unspecified: Secondary | ICD-10-CM | POA: Diagnosis not present

## 2015-03-04 DIAGNOSIS — G629 Polyneuropathy, unspecified: Secondary | ICD-10-CM | POA: Diagnosis not present

## 2015-03-05 DIAGNOSIS — G629 Polyneuropathy, unspecified: Secondary | ICD-10-CM | POA: Diagnosis not present

## 2015-05-07 ENCOUNTER — Ambulatory Visit: Payer: Medicare Other | Admitting: Podiatry

## 2015-06-25 ENCOUNTER — Ambulatory Visit (INDEPENDENT_AMBULATORY_CARE_PROVIDER_SITE_OTHER): Payer: Medicare Other | Admitting: Internal Medicine

## 2015-06-25 ENCOUNTER — Encounter: Payer: Self-pay | Admitting: Internal Medicine

## 2015-06-25 ENCOUNTER — Other Ambulatory Visit (INDEPENDENT_AMBULATORY_CARE_PROVIDER_SITE_OTHER): Payer: Medicare Other

## 2015-06-25 VITALS — BP 122/74 | HR 97 | Wt 234.0 lb

## 2015-06-25 DIAGNOSIS — E1165 Type 2 diabetes mellitus with hyperglycemia: Secondary | ICD-10-CM | POA: Diagnosis not present

## 2015-06-25 DIAGNOSIS — R972 Elevated prostate specific antigen [PSA]: Secondary | ICD-10-CM

## 2015-06-25 DIAGNOSIS — IMO0002 Reserved for concepts with insufficient information to code with codable children: Secondary | ICD-10-CM

## 2015-06-25 DIAGNOSIS — E1140 Type 2 diabetes mellitus with diabetic neuropathy, unspecified: Secondary | ICD-10-CM

## 2015-06-25 DIAGNOSIS — E785 Hyperlipidemia, unspecified: Secondary | ICD-10-CM

## 2015-06-25 DIAGNOSIS — M79645 Pain in left finger(s): Secondary | ICD-10-CM | POA: Insufficient documentation

## 2015-06-25 DIAGNOSIS — I10 Essential (primary) hypertension: Secondary | ICD-10-CM | POA: Diagnosis not present

## 2015-06-25 LAB — BASIC METABOLIC PANEL
BUN: 19 mg/dL (ref 6–23)
CHLORIDE: 104 meq/L (ref 96–112)
CO2: 25 meq/L (ref 19–32)
Calcium: 9.7 mg/dL (ref 8.4–10.5)
Creatinine, Ser: 1.07 mg/dL (ref 0.40–1.50)
GFR: 88.38 mL/min (ref >60.00)
Glucose, Bld: 223 mg/dL — ABNORMAL HIGH (ref 70–99)
POTASSIUM: 4.1 meq/L (ref 3.5–5.1)
Sodium: 137 mEq/L (ref 135–145)

## 2015-06-25 LAB — HEMOGLOBIN A1C: HEMOGLOBIN A1C: 11.2 % — AB (ref 4.6–6.5)

## 2015-06-25 MED ORDER — ALOGLIPTIN-PIOGLITAZONE 25-30 MG PO TABS: ORAL_TABLET | ORAL | Status: DC

## 2015-06-25 NOTE — Assessment & Plan Note (Signed)
5/17 L 5th due to pressure when driving Driving gloves with gel padding Release pressure

## 2015-06-25 NOTE — Patient Instructions (Signed)
Driving gloves with gel padding

## 2015-06-25 NOTE — Assessment & Plan Note (Signed)
Lipitor, ASA 

## 2015-06-25 NOTE — Progress Notes (Signed)
Subjective:  Patient ID: Clinton Kirby, male    DOB: 08/02/1945  Age: 70 y.o. MRN: [id number]  CC: No chief complaint on file.   HPI Clinton Kirby presents for DM, HTN, dyslipidemia f/u C/o L 5th finger pain when driving  Outpatient Prescriptions Prior to Visit  Medication Sig Dispense Refill  . aspirin 325 MG tablet Take 325 mg by mouth daily.    [name] atorvastatin (LIPITOR) 40 MG tablet Take 1 tablet (40 mg total) by mouth daily. 30 tablet 3  . canagliflozin (INVOKANA) 300 MG TABS tablet Take 300 mg by mouth daily before breakfast. 30 tablet 11  . cholecalciferol (VITAMIN D) 1000 units tablet Take 1 tablet (1,000 Units total) by mouth daily. 100 tablet 3  . glucose blood (ONETOUCH VERIO) test strip Use as instructed 50 each 11  . metFORMIN (GLUCOPHAGE) 1000 MG tablet Take 1 tablet (1,000 mg total) by mouth 2 (two) times daily with a meal. 60 tablet 11  . ONETOUCH DELICA LANCETS FINE MISC 1 Device by Does not apply route daily as needed. 100 each 3  . pregabalin (LYRICA) 50 MG capsule Take 1 capsule (50 mg total) by mouth daily. 30 capsule 5   No facility-administered medications prior to visit.    ROS Review of Systems  Constitutional: Negative for appetite change, fatigue and unexpected weight change.  HENT: Negative for congestion, nosebleeds, sneezing, sore throat and trouble swallowing.   Eyes: Negative for itching and visual disturbance.  Respiratory: Negative for cough.   Cardiovascular: Negative for chest pain, palpitations and leg swelling.  Gastrointestinal: Negative for nausea, diarrhea, blood in stool and abdominal distention.  Genitourinary: Negative for frequency and hematuria.  Musculoskeletal: Negative for back pain, joint swelling, gait problem and neck pain.  Skin: Negative for rash.  Neurological: Negative for dizziness, tremors, speech difficulty and weakness.  Psychiatric/Behavioral: Negative for suicidal ideas, sleep disturbance, dysphoric mood and  agitation. The patient is not nervous/anxious.     Objective:  BP 122/74 mmHg  Pulse 97  Wt 234 lb (106.142 kg)  SpO2 96%  BP Readings from Last 3 Encounters:  06/25/15 122/74  02/25/15 100/70  02/06/15 143/88    Wt Readings from Last 3 Encounters:  06/25/15 234 lb (106.142 kg)  02/25/15 236 lb (107.049 kg)  11/15/14 236 lb 14.4 oz (107.457 kg)    Physical Exam  Constitutional: He is oriented to person, place, and time. He appears well-developed. No distress.  NAD  HENT:  Mouth/Throat: Oropharynx is clear and moist.  Eyes: Conjunctivae are normal. Pupils are equal, round, and reactive to light.  Neck: Normal range of motion. No JVD present. No thyromegaly present.  Cardiovascular: Normal rate, regular rhythm, normal heart sounds and intact distal pulses.  Exam reveals no gallop and no friction rub.   No murmur heard. Pulmonary/Chest: Effort normal and breath sounds normal. No respiratory distress. He has no wheezes. He has no rales. He exhibits no tenderness.  Abdominal: Soft. Bowel sounds are normal. He exhibits no distension and no mass. There is no tenderness. There is no rebound and no guarding.  Musculoskeletal: Normal range of motion. He exhibits no edema or tenderness.  Lymphadenopathy:    He has no cervical adenopathy.  Neurological: He is alert and oriented to person, place, and time. He has normal reflexes. No cranial nerve deficit. He exhibits normal muscle tone. He displays a negative Romberg sign. Coordination and gait normal.  Skin: Skin is warm and dry. No rash noted.  Psychiatric:  He has a normal mood and affect. His behavior is normal. Judgment and thought content normal.  Obese  Lab Results  Component Value Date   WBC 7.6 11/09/2014   HGB 16.7 11/09/2014   HCT 49.5 11/09/2014   PLT 303.0 11/09/2014   GLUCOSE 84 11/09/2014   CHOL 195 11/09/2014   TRIG 203.0* 11/09/2014   HDL 51.40 11/09/2014   LDLDIRECT 136.0 11/09/2014   LDLCALC 153* 08/29/2013    ALT 18 11/09/2014   AST 14 11/09/2014   NA 141 11/09/2014   K 4.4 11/09/2014   CL 105 11/09/2014   CREATININE 0.91 11/09/2014   BUN 19 11/09/2014   CO2 31 11/09/2014   TSH 0.72 11/09/2014   PSA 41.53* 11/09/2014   HGBA1C 8.4* 11/09/2014   MICROALBUR 1.0 01/08/2014    No results found.  Assessment & Plan:   There are no diagnoses linked to this encounter. I am having [name] maintain his aspirin, glucose blood, ONETOUCH DELICA LANCETS FINE, canagliflozin, metFORMIN, atorvastatin, cholecalciferol, pregabalin, and clotrimazole-betamethasone.  Meds ordered this encounter  Medications  . clotrimazole-betamethasone (LOTRISONE) cream    Sig:      Follow-up: No Follow-up on file.  Walker Kehr, MD

## 2015-06-25 NOTE — Assessment & Plan Note (Signed)
Labs Invokana, Lipitor, Metformin

## 2015-06-25 NOTE — Progress Notes (Signed)
Pre visit review using our clinic review tool, if applicable. No additional management support is needed unless otherwise documented below in the visit note. 

## 2015-06-25 NOTE — Assessment & Plan Note (Signed)
On Invokana only  BP Readings from Last 3 Encounters:  06/25/15 122/74  02/25/15 100/70  02/06/15 143/88

## 2015-06-25 NOTE — Assessment & Plan Note (Signed)
PSA

## 2015-06-26 LAB — PSA, TOTAL AND FREE
PSA, Free Pct: 5 % — ABNORMAL LOW (ref >25)
PSA, Free: 1.99 ng/mL
PSA: 41.85 ng/mL — ABNORMAL HIGH (ref <=4.00)

## 2015-07-02 ENCOUNTER — Other Ambulatory Visit: Payer: Self-pay

## 2015-07-02 NOTE — Telephone Encounter (Signed)
PA received for Oseni - pt must first try and fail Januvia, Onglyza, and Trajenta. Please advise on alternative, thanks

## 2015-07-08 NOTE — Telephone Encounter (Signed)
Dahlia, [place] and Actos. Pls print Rx and mail Januvia savings card and Rx to the pt Thx

## 2015-07-09 MED ORDER — PIOGLITAZONE HCL 30 MG PO TABS: 30.0000 mg | ORAL_TABLET | Freq: Every day | ORAL | Status: DC

## 2015-07-09 MED ORDER — SITAGLIPTIN PHOSPHATE 100 MG PO TABS: 100.0000 mg | ORAL_TABLET | Freq: Every day | ORAL | Status: DC

## 2015-07-09 NOTE — Telephone Encounter (Signed)
Pt advised in detail via personal VM.  Rxs printed, signed and mailed with discount card

## 2015-08-26 ENCOUNTER — Other Ambulatory Visit: Payer: Self-pay | Admitting: Internal Medicine

## 2015-08-26 DIAGNOSIS — R972 Elevated prostate specific antigen [PSA]: Secondary | ICD-10-CM

## 2015-09-02 ENCOUNTER — Telehealth: Payer: Self-pay | Admitting: Internal Medicine

## 2015-09-02 DIAGNOSIS — R972 Elevated prostate specific antigen [PSA]: Secondary | ICD-10-CM

## 2015-09-02 NOTE — Telephone Encounter (Signed)
-----   Message from Cresenciano Lick, [place] sent at 06/28/2015 10:24 AM EDT ----- Pt's wife informed. Please place urol referral.

## 2015-09-24 ENCOUNTER — Other Ambulatory Visit: Payer: Self-pay | Admitting: *Deleted

## 2015-09-24 DIAGNOSIS — N471 Phimosis: Secondary | ICD-10-CM | POA: Diagnosis not present

## 2015-09-24 DIAGNOSIS — N481 Balanitis: Secondary | ICD-10-CM | POA: Diagnosis not present

## 2015-09-24 DIAGNOSIS — R972 Elevated prostate specific antigen [PSA]: Secondary | ICD-10-CM | POA: Diagnosis not present

## 2015-09-24 MED ORDER — GLUCOSE BLOOD VI STRP: 1.0000 | ORAL_STRIP | Freq: Every day | 5 refills | Status: DC

## 2015-09-24 MED ORDER — ONETOUCH DELICA LANCETS FINE MISC: 2 refills | Status: DC

## 2015-09-24 NOTE — Telephone Encounter (Signed)
Rec'd call pt states he is needing [facility] for his testing strips. Verified type of monitor pt is using inform will send the one touch ultra strips & lancets over...Clinton Kirby

## 2015-10-11 HISTORY — PX: CORONARY ARTERY BYPASS GRAFT: SHX141

## 2015-10-23 DIAGNOSIS — M2040 Other hammer toe(s) (acquired), unspecified foot: Secondary | ICD-10-CM | POA: Diagnosis not present

## 2015-10-23 DIAGNOSIS — E1142 Type 2 diabetes mellitus with diabetic polyneuropathy: Secondary | ICD-10-CM | POA: Diagnosis not present

## 2015-11-01 ENCOUNTER — Ambulatory Visit (INDEPENDENT_AMBULATORY_CARE_PROVIDER_SITE_OTHER): Payer: Medicare Other | Admitting: Internal Medicine

## 2015-11-01 ENCOUNTER — Encounter: Payer: Self-pay | Admitting: Internal Medicine

## 2015-11-01 DIAGNOSIS — Z23 Encounter for immunization: Secondary | ICD-10-CM | POA: Diagnosis not present

## 2015-11-01 DIAGNOSIS — IMO0002 Reserved for concepts with insufficient information to code with codable children: Secondary | ICD-10-CM

## 2015-11-01 DIAGNOSIS — E1140 Type 2 diabetes mellitus with diabetic neuropathy, unspecified: Secondary | ICD-10-CM

## 2015-11-01 DIAGNOSIS — E0842 Diabetes mellitus due to underlying condition with diabetic polyneuropathy: Secondary | ICD-10-CM | POA: Diagnosis not present

## 2015-11-01 DIAGNOSIS — R972 Elevated prostate specific antigen [PSA]: Secondary | ICD-10-CM | POA: Diagnosis not present

## 2015-11-01 DIAGNOSIS — E1165 Type 2 diabetes mellitus with hyperglycemia: Secondary | ICD-10-CM

## 2015-11-01 DIAGNOSIS — I10 Essential (primary) hypertension: Secondary | ICD-10-CM

## 2015-11-01 MED ORDER — DAPAGLIFLOZIN PROPANEDIOL 10 MG PO TABS: 10.0000 mg | ORAL_TABLET | Freq: Every day | ORAL | 11 refills | Status: DC

## 2015-11-01 MED ORDER — SITAGLIP PHOS-METFORMIN HCL ER 100-1000 MG PO TB24: ORAL_TABLET | ORAL | 11 refills | Status: DC

## 2015-11-01 NOTE — Assessment & Plan Note (Signed)
Off Lyrica, Cymbalta DM control is need

## 2015-11-01 NOTE — Progress Notes (Signed)
Pre visit review using our clinic review tool, if applicable. No additional management support is needed unless otherwise documented below in the visit note. 

## 2015-11-01 NOTE — Progress Notes (Signed)
Subjective:  Patient ID: Clinton Kirby, male    DOB: 07/23/1945  Age: 70 y.o. MRN: [id number]  CC: No chief complaint on file.   HPI Clinton Kirby presents for a poorly controlled DM, dyslipidemia, neuropathy f/u  Outpatient Medications Prior to Visit  Medication Sig Dispense Refill  . aspirin 325 MG tablet Take 325 mg by mouth daily.    [name] atorvastatin (LIPITOR) 40 MG tablet Take 1 tablet (40 mg total) by mouth daily. 30 tablet 3  . canagliflozin (INVOKANA) 300 MG TABS tablet Take 300 mg by mouth daily before breakfast. 30 tablet 11  . clotrimazole-betamethasone (LOTRISONE) cream     . glucose blood (ONE TOUCH ULTRA TEST) test strip 1 each by Other route daily. Use to check blood sugars once a day Dx E11.9 35 each 5  . metFORMIN (GLUCOPHAGE) 1000 MG tablet Take 1 tablet (1,000 mg total) by mouth 2 (two) times daily with a meal. 60 tablet 11  . ONETOUCH DELICA LANCETS FINE MISC Use to help check blood sugars daily Dx E11.9 100 each 2  . cholecalciferol (VITAMIN D) 1000 units tablet Take 1 tablet (1,000 Units total) by mouth daily. (Patient not taking: Reported on 11/01/2015) 100 tablet 3  . pioglitazone (ACTOS) 30 MG tablet Take 1 tablet (30 mg total) by mouth daily. (Patient not taking: Reported on 11/01/2015) 30 tablet 11  . pregabalin (LYRICA) 50 MG capsule Take 1 capsule (50 mg total) by mouth daily. (Patient not taking: Reported on 11/01/2015) 30 capsule 5  . sitaGLIPtin (JANUVIA) 100 MG tablet Take 1 tablet (100 mg total) by mouth daily. (Patient not taking: Reported on 11/01/2015) 30 tablet 11   No facility-administered medications prior to visit.     ROS Review of Systems  Constitutional: Negative for appetite change, fatigue and unexpected weight change.  HENT: Negative for congestion, nosebleeds, sneezing, sore throat and trouble swallowing.   Eyes: Negative for itching and visual disturbance.  Respiratory: Negative for cough.   Cardiovascular: Negative for chest  pain, palpitations and leg swelling.  Gastrointestinal: Negative for abdominal distention, blood in stool, diarrhea and nausea.  Genitourinary: Negative for frequency and hematuria.  Musculoskeletal: Positive for arthralgias. Negative for back pain, gait problem, joint swelling and neck pain.  Skin: Negative for rash.  Neurological: Negative for dizziness, tremors, speech difficulty and weakness.  Psychiatric/Behavioral: Negative for agitation, dysphoric mood and sleep disturbance. The patient is not nervous/anxious.     Objective:  BP 110/70   Pulse 98   Wt 228 lb (103.4 kg)   SpO2 94%   BMI 30.92 kg/m   BP Readings from Last 3 Encounters:  11/01/15 110/70  06/25/15 122/74  02/25/15 100/70    Wt Readings from Last 3 Encounters:  11/01/15 228 lb (103.4 kg)  06/25/15 234 lb (106.1 kg)  02/25/15 236 lb (107 kg)    Physical Exam  Constitutional: He is oriented to person, place, and time. He appears well-developed. No distress.  NAD  HENT:  Mouth/Throat: Oropharynx is clear and moist.  Eyes: Conjunctivae are normal. Pupils are equal, round, and reactive to light.  Neck: Normal range of motion. No JVD present. No thyromegaly present.  Cardiovascular: Normal rate, regular rhythm, normal heart sounds and intact distal pulses.  Exam reveals no gallop and no friction rub.   No murmur heard. Pulmonary/Chest: Effort normal and breath sounds normal. No respiratory distress. He has no wheezes. He has no rales. He exhibits no tenderness.  Abdominal: Soft. Bowel sounds are  normal. He exhibits no distension and no mass. There is no tenderness. There is no rebound and no guarding.  Musculoskeletal: Normal range of motion. He exhibits no edema or tenderness.  Lymphadenopathy:    He has no cervical adenopathy.  Neurological: He is alert and oriented to person, place, and time. He has normal reflexes. No cranial nerve deficit. He exhibits normal muscle tone. He displays a negative Romberg  sign. Coordination and gait normal.  Skin: Skin is warm and dry. No rash noted.  Psychiatric: He has a normal mood and affect. His behavior is normal. Judgment and thought content normal.    Lab Results  Component Value Date   WBC 7.6 11/09/2014   HGB 16.7 11/09/2014   HCT 49.5 11/09/2014   PLT 303.0 11/09/2014   GLUCOSE 223 (H) 06/25/2015   CHOL 195 11/09/2014   TRIG 203.0 (H) 11/09/2014   HDL 51.40 11/09/2014   LDLDIRECT 136.0 11/09/2014   LDLCALC 153 (H) 08/29/2013   ALT 18 11/09/2014   AST 14 11/09/2014   NA 137 06/25/2015   K 4.1 06/25/2015   CL 104 06/25/2015   CREATININE 1.07 06/25/2015   BUN 19 06/25/2015   CO2 25 06/25/2015   TSH 0.72 11/09/2014   PSA 41.85 (H) 06/25/2015   HGBA1C 11.2 (H) 06/25/2015   MICROALBUR 1.0 01/08/2014    No results found.  Assessment & Plan:   There are no diagnoses linked to this encounter. I have discontinued [name] sitaGLIPtin. I am also having him maintain his aspirin, canagliflozin, metFORMIN, atorvastatin, cholecalciferol, pregabalin, clotrimazole-betamethasone, pioglitazone, glucose blood, ONETOUCH DELICA LANCETS FINE, and DULoxetine.  Meds ordered this encounter  Medications  . DULoxetine (CYMBALTA) 60 MG capsule    Sig: Take 60 mg by mouth daily.     Follow-up: No Follow-up on file.  Walker Kehr, MD

## 2015-11-01 NOTE — Assessment & Plan Note (Signed)
Urology appt is pending 

## 2015-11-01 NOTE — Assessment & Plan Note (Signed)
On Invokana only - too $$$ Try Wilder Glade if not too $$$  Pt stopped  Losartan, Toprol

## 2015-11-01 NOTE — Assessment & Plan Note (Signed)
Complicated control issues.  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

## 2015-11-01 NOTE — Addendum Note (Signed)
Addended by: Cresenciano Lick on: 11/01/2015 04:40 PM   Modules accepted: Orders

## 2015-11-05 DIAGNOSIS — R079 Chest pain, unspecified: Secondary | ICD-10-CM | POA: Diagnosis not present

## 2015-11-05 DIAGNOSIS — Z01818 Encounter for other preprocedural examination: Secondary | ICD-10-CM | POA: Diagnosis not present

## 2015-11-05 DIAGNOSIS — I252 Old myocardial infarction: Secondary | ICD-10-CM | POA: Diagnosis not present

## 2015-11-05 DIAGNOSIS — R0789 Other chest pain: Secondary | ICD-10-CM | POA: Diagnosis not present

## 2015-11-05 DIAGNOSIS — I2510 Atherosclerotic heart disease of native coronary artery without angina pectoris: Secondary | ICD-10-CM | POA: Diagnosis not present

## 2015-11-05 DIAGNOSIS — I25119 Atherosclerotic heart disease of native coronary artery with unspecified angina pectoris: Secondary | ICD-10-CM | POA: Diagnosis not present

## 2015-11-05 DIAGNOSIS — I209 Angina pectoris, unspecified: Secondary | ICD-10-CM | POA: Diagnosis not present

## 2015-11-05 DIAGNOSIS — I25118 Atherosclerotic heart disease of native coronary artery with other forms of angina pectoris: Secondary | ICD-10-CM | POA: Diagnosis not present

## 2015-11-05 DIAGNOSIS — Z01810 Encounter for preprocedural cardiovascular examination: Secondary | ICD-10-CM | POA: Diagnosis not present

## 2015-11-05 DIAGNOSIS — Z955 Presence of coronary angioplasty implant and graft: Secondary | ICD-10-CM | POA: Diagnosis not present

## 2015-11-05 DIAGNOSIS — I10 Essential (primary) hypertension: Secondary | ICD-10-CM | POA: Diagnosis not present

## 2015-11-05 DIAGNOSIS — R9439 Abnormal result of other cardiovascular function study: Secondary | ICD-10-CM | POA: Diagnosis not present

## 2015-11-05 DIAGNOSIS — I200 Unstable angina: Secondary | ICD-10-CM | POA: Diagnosis not present

## 2015-11-05 DIAGNOSIS — E1140 Type 2 diabetes mellitus with diabetic neuropathy, unspecified: Secondary | ICD-10-CM | POA: Diagnosis not present

## 2015-11-05 DIAGNOSIS — I517 Cardiomegaly: Secondary | ICD-10-CM | POA: Diagnosis not present

## 2015-11-05 DIAGNOSIS — E119 Type 2 diabetes mellitus without complications: Secondary | ICD-10-CM | POA: Diagnosis not present

## 2015-11-05 DIAGNOSIS — I110 Hypertensive heart disease with heart failure: Secondary | ICD-10-CM | POA: Diagnosis not present

## 2015-11-05 DIAGNOSIS — E7800 Pure hypercholesterolemia, unspecified: Secondary | ICD-10-CM | POA: Diagnosis not present

## 2015-11-05 DIAGNOSIS — E784 Other hyperlipidemia: Secondary | ICD-10-CM | POA: Diagnosis not present

## 2015-11-05 DIAGNOSIS — Z951 Presence of aortocoronary bypass graft: Secondary | ICD-10-CM | POA: Diagnosis not present

## 2015-11-05 DIAGNOSIS — I5022 Chronic systolic (congestive) heart failure: Secondary | ICD-10-CM | POA: Diagnosis not present

## 2015-11-05 DIAGNOSIS — I499 Cardiac arrhythmia, unspecified: Secondary | ICD-10-CM | POA: Diagnosis not present

## 2015-11-05 DIAGNOSIS — Z7982 Long term (current) use of aspirin: Secondary | ICD-10-CM | POA: Diagnosis not present

## 2015-11-05 DIAGNOSIS — Z4682 Encounter for fitting and adjustment of non-vascular catheter: Secondary | ICD-10-CM | POA: Diagnosis not present

## 2015-11-05 DIAGNOSIS — Z452 Encounter for adjustment and management of vascular access device: Secondary | ICD-10-CM | POA: Diagnosis not present

## 2015-11-05 DIAGNOSIS — Z79899 Other long term (current) drug therapy: Secondary | ICD-10-CM | POA: Diagnosis not present

## 2015-11-05 DIAGNOSIS — R918 Other nonspecific abnormal finding of lung field: Secondary | ICD-10-CM | POA: Diagnosis not present

## 2015-11-05 DIAGNOSIS — E1165 Type 2 diabetes mellitus with hyperglycemia: Secondary | ICD-10-CM | POA: Diagnosis not present

## 2015-11-05 DIAGNOSIS — J9811 Atelectasis: Secondary | ICD-10-CM | POA: Diagnosis not present

## 2015-11-05 DIAGNOSIS — R9430 Abnormal result of cardiovascular function study, unspecified: Secondary | ICD-10-CM | POA: Diagnosis not present

## 2015-11-05 DIAGNOSIS — I5020 Unspecified systolic (congestive) heart failure: Secondary | ICD-10-CM | POA: Diagnosis not present

## 2015-11-05 DIAGNOSIS — I255 Ischemic cardiomyopathy: Secondary | ICD-10-CM | POA: Diagnosis not present

## 2015-11-05 DIAGNOSIS — I493 Ventricular premature depolarization: Secondary | ICD-10-CM | POA: Diagnosis not present

## 2015-11-05 DIAGNOSIS — R9431 Abnormal electrocardiogram [ECG] [EKG]: Secondary | ICD-10-CM | POA: Diagnosis not present

## 2015-11-05 DIAGNOSIS — I420 Dilated cardiomyopathy: Secondary | ICD-10-CM | POA: Diagnosis not present

## 2015-11-05 DIAGNOSIS — R938 Abnormal findings on diagnostic imaging of other specified body structures: Secondary | ICD-10-CM | POA: Diagnosis not present

## 2015-11-05 DIAGNOSIS — E785 Hyperlipidemia, unspecified: Secondary | ICD-10-CM | POA: Diagnosis not present

## 2015-11-05 DIAGNOSIS — R000 Tachycardia, unspecified: Secondary | ICD-10-CM | POA: Diagnosis not present

## 2015-11-05 DIAGNOSIS — E1142 Type 2 diabetes mellitus with diabetic polyneuropathy: Secondary | ICD-10-CM | POA: Insufficient documentation

## 2015-11-05 DIAGNOSIS — I2119 ST elevation (STEMI) myocardial infarction involving other coronary artery of inferior wall: Secondary | ICD-10-CM | POA: Diagnosis not present

## 2015-11-05 DIAGNOSIS — E876 Hypokalemia: Secondary | ICD-10-CM | POA: Diagnosis not present

## 2015-11-05 DIAGNOSIS — I501 Left ventricular failure: Secondary | ICD-10-CM | POA: Diagnosis not present

## 2015-11-06 DIAGNOSIS — Z9861 Coronary angioplasty status: Secondary | ICD-10-CM

## 2015-11-06 DIAGNOSIS — R079 Chest pain, unspecified: Secondary | ICD-10-CM | POA: Diagnosis not present

## 2015-11-06 DIAGNOSIS — I2510 Atherosclerotic heart disease of native coronary artery without angina pectoris: Secondary | ICD-10-CM | POA: Diagnosis not present

## 2015-11-14 DIAGNOSIS — Z951 Presence of aortocoronary bypass graft: Secondary | ICD-10-CM | POA: Insufficient documentation

## 2015-11-14 DIAGNOSIS — I25119 Atherosclerotic heart disease of native coronary artery with unspecified angina pectoris: Secondary | ICD-10-CM | POA: Insufficient documentation

## 2015-11-15 ENCOUNTER — Inpatient Hospital Stay: Payer: Medicare Other | Admitting: Internal Medicine

## 2015-12-04 ENCOUNTER — Ambulatory Visit (INDEPENDENT_AMBULATORY_CARE_PROVIDER_SITE_OTHER): Payer: Medicare Other | Admitting: Cardiology

## 2015-12-04 ENCOUNTER — Encounter: Payer: Self-pay | Admitting: *Deleted

## 2015-12-04 ENCOUNTER — Encounter: Payer: Self-pay | Admitting: Cardiology

## 2015-12-04 VITALS — BP 126/72 | HR 96 | Ht 72.0 in | Wt 221.0 lb

## 2015-12-04 DIAGNOSIS — I2510 Atherosclerotic heart disease of native coronary artery without angina pectoris: Secondary | ICD-10-CM

## 2015-12-04 DIAGNOSIS — I10 Essential (primary) hypertension: Secondary | ICD-10-CM

## 2015-12-04 DIAGNOSIS — I255 Ischemic cardiomyopathy: Secondary | ICD-10-CM | POA: Diagnosis not present

## 2015-12-04 DIAGNOSIS — Z9861 Coronary angioplasty status: Secondary | ICD-10-CM

## 2015-12-04 DIAGNOSIS — Z951 Presence of aortocoronary bypass graft: Secondary | ICD-10-CM | POA: Diagnosis not present

## 2015-12-04 DIAGNOSIS — E785 Hyperlipidemia, unspecified: Secondary | ICD-10-CM

## 2015-12-04 DIAGNOSIS — E1149 Type 2 diabetes mellitus with other diabetic neurological complication: Secondary | ICD-10-CM

## 2015-12-04 NOTE — Assessment & Plan Note (Signed)
EF 40% by echo post CABG 11/13/15

## 2015-12-04 NOTE — Progress Notes (Signed)
12/04/2015 Clinton Kirby   September 03, 1945  [id number]  Primary Physician Walker Kehr, MD Primary Cardiologist: Dr Martinique  HPI:  70 y/o obese AA male followed by Dr Martinique in the [place] Oct 2015. He has a history of CAD, s/p RCA PCI in 2004. He has DM with neuropathy, HTN, and HLD. He is in the office today to be seen s/p CABG in [facility] on 11/11/15. The history was quite confusing, the pt was not very clear on details and not all the hospital records were in [place]. Apparently he was admitted to [facility] in late Sept, had an abnormal Myoview followed by cath 11/06/15. His cath showed a patent RCA stent but disease in his OMs and an occluded LAD. There was mention of an EF of 28% in the notes. He underwent CABG x 3 with "LIMA-LAD and two vein grafts" 11/11/15 by Dr [name]. Post OP echo 11/13/15 showed his EF to be 40%. Since discharge he has done well, just weak. No unusual dyspnea or tachycardia. He was discharged on Coreg 6.25 mg BID but its not he was taking this.    Current Outpatient Prescriptions  Medication Sig Dispense Refill  . aspirin 325 MG tablet Take 325 mg by mouth daily.    [name] atorvastatin (LIPITOR) 40 MG tablet Take 1 tablet (40 mg total) by mouth daily. 30 tablet 3  . carvedilol (COREG) 6.25 MG tablet Take 6.25 mg by mouth 2 (two) times daily with a meal.    . clotrimazole-betamethasone (LOTRISONE) cream     . dapagliflozin propanediol (FARXIGA) 10 MG TABS tablet Take 10 mg by mouth daily. 30 tablet 11  . DULoxetine (CYMBALTA) 60 MG capsule Take 60 mg by mouth daily.    [name] glucose blood (ONE TOUCH ULTRA TEST) test strip 1 each by Other route daily. Use to check blood sugars once a day Dx E11.9 35 each 5  . INVOKANA 300 MG TABS tablet Take 1 tablet by mouth daily.    Glory Rosebush DELICA LANCETS FINE MISC Use to help check blood sugars daily Dx E11.9 100 each 2  . SitaGLIPtin-MetFORMIN HCl (JANUMET XR) [phone] MG TB24 1 po qd 30 tablet 11   No current  facility-administered medications for this visit.     No Known Allergies  Social History   Social History  . Marital status: Married    Spouse name: N/A  . Number of children: 1  . Years of education: N/A   Occupational History  . truck driver-self employed    Social History Main Topics  . Smoking status: Never Smoker  . Smokeless tobacco: Never Used  . Alcohol use No  . Drug use: No  . Sexual activity: Yes   Other Topics Concern  . Not on file   Social History Narrative  . No narrative on file     Review of Systems: General: negative for chills, fever, night sweats or weight changes.  Cardiovascular: negative for chest pain, dyspnea on exertion, edema, orthopnea, palpitations, paroxysmal nocturnal dyspnea or shortness of breath Dermatological: negative for rash Respiratory: negative for cough or wheezing Urologic: negative for hematuria Abdominal: negative for nausea, vomiting, diarrhea, bright red blood per rectum, melena, or hematemesis Neurologic: negative for visual changes, syncope, or dizziness All other systems reviewed and are otherwise negative except as noted above.    Blood pressure 126/72, pulse 96, height 6' (1.829 m), weight 221 lb (100.2 kg).  General appearance: alert, cooperative, no distress and morbidly obese  Neck: no carotid bruit and no JVD Lungs: basilar crackles on Rt Heart: regular rate and rhythm Extremities: trace edema Skin: Skin color, texture, turgor normal. No rashes or lesions Neurologic: Grossly normal  EKG NSR, PVC  ASSESSMENT AND PLAN:   Cardiomyopathy, ischemic EF 40% by echo post CABG 11/13/15  Diabetes mellitus type 2 with neurological manifestations ([place]) Dr Dwyane Dee Complicated control issues.  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 I suggested Mr Deeds continue Coreg 6.25 mg BID (I don't think he had been taking this). He knows to contact us if he has unusual dyspnea or tachycardia. He should see Dr Martinique in 6-8 weeks.  Kerin Ransom PA-C 12/04/2015 4:39 PM

## 2015-12-04 NOTE — Assessment & Plan Note (Signed)
Dr Dwyane Dee Complicated control issues.  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

## 2015-12-04 NOTE — Patient Instructions (Signed)
Medication Instructions:  Your physician recommends that you continue on your current medications as directed. Please refer to the Current Medication list given to you today.  Labwork: None   Testing/Procedures: None   Follow-Up: Your physician recommends that you schedule a follow-up appointment in: 6-8 weeks with Dr Martinique.  Any Other Special Instructions Will Be Listed Below (If Applicable).    If you need a refill on your cardiac medications before your next appointment, please call your pharmacy.

## 2015-12-04 NOTE — Assessment & Plan Note (Signed)
Controlled post CABG

## 2015-12-04 NOTE — Assessment & Plan Note (Signed)
Statin increased post CABG

## 2015-12-17 ENCOUNTER — Other Ambulatory Visit: Payer: Self-pay | Admitting: Internal Medicine

## 2015-12-19 DIAGNOSIS — E119 Type 2 diabetes mellitus without complications: Secondary | ICD-10-CM | POA: Diagnosis not present

## 2015-12-19 DIAGNOSIS — Z7984 Long term (current) use of oral hypoglycemic drugs: Secondary | ICD-10-CM | POA: Diagnosis not present

## 2015-12-19 DIAGNOSIS — L84 Corns and callosities: Secondary | ICD-10-CM | POA: Diagnosis not present

## 2015-12-19 DIAGNOSIS — M7742 Metatarsalgia, left foot: Secondary | ICD-10-CM | POA: Diagnosis not present

## 2015-12-25 ENCOUNTER — Other Ambulatory Visit: Payer: Self-pay | Admitting: *Deleted

## 2015-12-25 MED ORDER — GLUCOSE BLOOD VI STRP: 1.0000 | ORAL_STRIP | Freq: Two times a day (BID) | 3 refills | Status: DC

## 2015-12-25 MED ORDER — ONETOUCH DELICA LANCETS FINE MISC: 3 refills | Status: DC

## 2015-12-25 NOTE — Telephone Encounter (Signed)
Rec'd call pt state she is almost out of his testing strips & lancets. Verified name of supplies pt is needing one touch verio inform will [facility] to [facility]...Clinton Kirby

## 2015-12-30 ENCOUNTER — Other Ambulatory Visit: Payer: Self-pay | Admitting: Internal Medicine

## 2016-01-15 DIAGNOSIS — R972 Elevated prostate specific antigen [PSA]: Secondary | ICD-10-CM | POA: Diagnosis not present

## 2016-01-20 DIAGNOSIS — R972 Elevated prostate specific antigen [PSA]: Secondary | ICD-10-CM | POA: Diagnosis not present

## 2016-01-24 ENCOUNTER — Other Ambulatory Visit: Payer: Self-pay | Admitting: Urology

## 2016-01-24 DIAGNOSIS — C61 Malignant neoplasm of prostate: Secondary | ICD-10-CM

## 2016-01-31 ENCOUNTER — Other Ambulatory Visit (INDEPENDENT_AMBULATORY_CARE_PROVIDER_SITE_OTHER): Payer: Medicare Other

## 2016-01-31 ENCOUNTER — Encounter: Payer: Self-pay | Admitting: Internal Medicine

## 2016-01-31 ENCOUNTER — Ambulatory Visit (INDEPENDENT_AMBULATORY_CARE_PROVIDER_SITE_OTHER): Payer: Medicare Other | Admitting: Internal Medicine

## 2016-01-31 DIAGNOSIS — E1149 Type 2 diabetes mellitus with other diabetic neurological complication: Secondary | ICD-10-CM | POA: Diagnosis not present

## 2016-01-31 DIAGNOSIS — I2510 Atherosclerotic heart disease of native coronary artery without angina pectoris: Secondary | ICD-10-CM

## 2016-01-31 DIAGNOSIS — E1165 Type 2 diabetes mellitus with hyperglycemia: Secondary | ICD-10-CM | POA: Diagnosis not present

## 2016-01-31 DIAGNOSIS — Z9861 Coronary angioplasty status: Secondary | ICD-10-CM

## 2016-01-31 DIAGNOSIS — R972 Elevated prostate specific antigen [PSA]: Secondary | ICD-10-CM | POA: Diagnosis not present

## 2016-01-31 DIAGNOSIS — I10 Essential (primary) hypertension: Secondary | ICD-10-CM | POA: Diagnosis not present

## 2016-01-31 DIAGNOSIS — F4329 Adjustment disorder with other symptoms: Secondary | ICD-10-CM | POA: Insufficient documentation

## 2016-01-31 DIAGNOSIS — R079 Chest pain, unspecified: Secondary | ICD-10-CM

## 2016-01-31 DIAGNOSIS — C61 Malignant neoplasm of prostate: Secondary | ICD-10-CM

## 2016-01-31 DIAGNOSIS — IMO0002 Reserved for concepts with insufficient information to code with codable children: Secondary | ICD-10-CM

## 2016-01-31 DIAGNOSIS — E1140 Type 2 diabetes mellitus with diabetic neuropathy, unspecified: Secondary | ICD-10-CM | POA: Diagnosis not present

## 2016-01-31 LAB — HEPATIC FUNCTION PANEL
ALBUMIN: 4.5 g/dL (ref 3.5–5.2)
ALK PHOS: 57 U/L (ref 39–117)
ALT: 23 U/L (ref 0–53)
AST: 17 U/L (ref 0–37)
Bilirubin, Direct: 0 mg/dL (ref 0.0–0.3)
Total Bilirubin: 0.5 mg/dL (ref 0.2–1.2)
Total Protein: 7.4 g/dL (ref 6.0–8.3)

## 2016-01-31 LAB — LIPID PANEL
Cholesterol: 129 mg/dL (ref 0–200)
HDL: 47.2 mg/dL (ref >39.00)
LDL Cholesterol: 49 mg/dL (ref 0–99)
NONHDL: 82.25
Total CHOL/HDL Ratio: 3
Triglycerides: 167 mg/dL — ABNORMAL HIGH (ref 0.0–149.0)
VLDL: 33.4 mg/dL (ref 0.0–40.0)

## 2016-01-31 LAB — BASIC METABOLIC PANEL
BUN: 17 mg/dL (ref 6–23)
CHLORIDE: 102 meq/L (ref 96–112)
CO2: 30 meq/L (ref 19–32)
Calcium: 10 mg/dL (ref 8.4–10.5)
Creatinine, Ser: 1 mg/dL (ref 0.40–1.50)
GFR: 95.38 mL/min (ref >60.00)
GLUCOSE: 117 mg/dL — AB (ref 70–99)
POTASSIUM: 4.4 meq/L (ref 3.5–5.1)
Sodium: 140 mEq/L (ref 135–145)

## 2016-01-31 LAB — HEMOGLOBIN A1C: HEMOGLOBIN A1C: 7.2 % — AB (ref 4.6–6.5)

## 2016-01-31 MED ORDER — ESCITALOPRAM OXALATE 5 MG PO TABS: 5.0000 mg | ORAL_TABLET | Freq: Every day | ORAL | 5 refills | Status: DC

## 2016-01-31 NOTE — Assessment & Plan Note (Signed)
  Bone scan w/Dr Diona Fanti pending

## 2016-01-31 NOTE — Assessment & Plan Note (Signed)
Losartan, Toprol

## 2016-01-31 NOTE — Assessment & Plan Note (Signed)
CABG 9/17

## 2016-01-31 NOTE — Assessment & Plan Note (Signed)
Lexapro 

## 2016-01-31 NOTE — Assessment & Plan Note (Signed)
12/17 Bone scan w/Dr Dahlstedt pending

## 2016-01-31 NOTE — Assessment & Plan Note (Signed)
Invokana, Janumet XR,   Lipitor,  ASA

## 2016-01-31 NOTE — Assessment & Plan Note (Signed)
L chest wall pain - [facility] vs other Bone scan w/Dr Dahlstedt pending

## 2016-01-31 NOTE — Progress Notes (Signed)
Subjective:  Patient ID: Clinton Kirby, male    DOB: [birth year]  Age: 70 y.o. MRN: [id number]  CC: No chief complaint on file.   HPI Clinton Kirby presents for DM, HTN, neuropathy. C/o occ L CP at night in bed - "sore" to touch  Outpatient Medications Prior to Visit  Medication Sig Dispense Refill  . aspirin 325 MG tablet Take 325 mg by mouth daily.    [name] atorvastatin (LIPITOR) 40 MG tablet TAKE ONE TABLET BY MOUTH ONCE DAILY 90 tablet 1  . carvedilol (COREG) 6.25 MG tablet Take 6.25 mg by mouth 2 (two) times daily with a meal.    . clotrimazole-betamethasone (LOTRISONE) cream APPLY  CREAM TOPICALLY TO AFFECTED AREA TWICE DAILY 45 g 1  . DULoxetine (CYMBALTA) 60 MG capsule Take 60 mg by mouth daily.    [name] glucose blood (ONETOUCH VERIO) test strip 1 each by Other route 2 (two) times daily. Use to check blood sugars twice a day Dx E11.9 100 each 3  . INVOKANA 300 MG TABS tablet Take 1 tablet by mouth daily.    Clinton Kirby DELICA LANCETS FINE MISC Use to help check blood sugars daily Dx E11.9 100 each 3  . dapagliflozin propanediol (FARXIGA) 10 MG TABS tablet Take 10 mg by mouth daily. (Patient not taking: Reported on 01/31/2016) 30 tablet 11  . SitaGLIPtin-MetFORMIN HCl (JANUMET XR) [phone] MG TB24 1 po qd (Patient not taking: Reported on 01/31/2016) 30 tablet 11  . clotrimazole-betamethasone (LOTRISONE) cream      No facility-administered medications prior to visit.     ROS Review of Systems  Constitutional: Negative for appetite change, fatigue and unexpected weight change.  HENT: Negative for congestion, nosebleeds, sneezing, sore throat and trouble swallowing.   Eyes: Negative for itching and visual disturbance.  Respiratory: Negative for cough and shortness of breath.   Cardiovascular: Positive for chest pain. Negative for palpitations and leg swelling.  Gastrointestinal: Negative for abdominal distention, blood in stool, diarrhea and nausea.  Genitourinary: Negative for  frequency and hematuria.  Musculoskeletal: Negative for back pain, gait problem, joint swelling and neck pain.  Skin: Negative for rash.  Neurological: Negative for dizziness, tremors, speech difficulty and weakness.  Psychiatric/Behavioral: Negative for agitation, dysphoric mood and sleep disturbance. The patient is not nervous/anxious.     Objective:  BP 120/78   Pulse 96   Wt 224 lb (101.6 kg)   SpO2 98%   BMI 30.38 kg/m   BP Readings from Last 3 Encounters:  01/31/16 120/78  12/04/15 126/72  11/01/15 110/70    Wt Readings from Last 3 Encounters:  01/31/16 224 lb (101.6 kg)  12/04/15 221 lb (100.2 kg)  11/01/15 228 lb (103.4 kg)    Physical Exam  Constitutional: He is oriented to person, place, and time. He appears well-developed. No distress.  NAD  HENT:  Mouth/Throat: Oropharynx is clear and moist.  Eyes: Conjunctivae are normal. Pupils are equal, round, and reactive to light.  Neck: Normal range of motion. No JVD present. No thyromegaly present.  Cardiovascular: Normal rate, regular rhythm, normal heart sounds and intact distal pulses.  Exam reveals no gallop and no friction rub.   No murmur heard. Pulmonary/Chest: Effort normal and breath sounds normal. No respiratory distress. He has no wheezes. He has no rales. He exhibits no tenderness.  Abdominal: Soft. Bowel sounds are normal. He exhibits no distension and no mass. There is no tenderness. There is no rebound and no guarding.  Musculoskeletal: Normal  range of motion. He exhibits no edema or tenderness.  Lymphadenopathy:    He has no cervical adenopathy.  Neurological: He is alert and oriented to person, place, and time. He has normal reflexes. No cranial nerve deficit. He exhibits normal muscle tone. He displays a negative Romberg sign. Coordination and gait normal.  Skin: Skin is warm and dry. No rash noted.  Psychiatric: He has a normal mood and affect. His behavior is normal. Judgment and thought content  normal.    Lab Results  Component Value Date   WBC 7.6 11/09/2014   HGB 16.7 11/09/2014   HCT 49.5 11/09/2014   PLT 303.0 11/09/2014   GLUCOSE 223 (H) 06/25/2015   CHOL 195 11/09/2014   TRIG 203.0 (H) 11/09/2014   HDL 51.40 11/09/2014   LDLDIRECT 136.0 11/09/2014   LDLCALC 153 (H) 08/29/2013   ALT 18 11/09/2014   AST 14 11/09/2014   NA 137 06/25/2015   K 4.1 06/25/2015   CL 104 06/25/2015   CREATININE 1.07 06/25/2015   BUN 19 06/25/2015   CO2 25 06/25/2015   TSH 0.72 11/09/2014   PSA 41.85 (H) 06/25/2015   HGBA1C 11.2 (H) 06/25/2015   MICROALBUR 1.0 01/08/2014    No results found.  Assessment & Plan:   There are no diagnoses linked to this encounter. I am having Mr. Thilges maintain his aspirin, DULoxetine, SitaGLIPtin-MetFORMIN HCl, dapagliflozin propanediol, INVOKANA, carvedilol, atorvastatin, glucose blood, ONETOUCH DELICA LANCETS FINE, clotrimazole-betamethasone, and metFORMIN.  Meds ordered this encounter  Medications  . metFORMIN (GLUCOPHAGE) 1000 MG tablet    Sig: Take 1,000 mg by mouth daily with breakfast.     Follow-up: No Follow-up on file.  Walker Kehr, MD

## 2016-01-31 NOTE — Progress Notes (Signed)
Pre visit review using our clinic review tool, if applicable. No additional management support is needed unless otherwise documented below in the visit note. 

## 2016-02-07 ENCOUNTER — Encounter (HOSPITAL_COMMUNITY)
Admission: RE | Admit: 2016-02-07 | Discharge: 2016-02-07 | Disposition: A | Discharge status: Home / Self Care | Payer: Medicare Other | Source: Ambulatory Visit | Attending: Urology | Admitting: Urology

## 2016-02-07 DIAGNOSIS — C61 Malignant neoplasm of prostate: Secondary | ICD-10-CM | POA: Diagnosis not present

## 2016-02-07 MED ORDER — TECHNETIUM TC 99M MEDRONATE IV KIT
20.9000 | PACK | Freq: Once | INTRAVENOUS | Status: AC | PRN
  Administered 2016-02-07: 20.9 via INTRAVENOUS

## 2016-02-08 NOTE — Progress Notes (Addendum)
Cardiology Office Note    Date:  02/12/2016   ID:  [name], [name] 1945/09/10, MRN [id number]  PCP:  Walker Kehr, MD  Cardiologist:  Ashlley Booher Martinique, MD    History of Present Illness:  Clinton Kirby is a 70 y.o. male with known history of coronary disease and  poorly controlled diabetes mellitus type 2 as well as hyperlipidemia, hypertension and peripheral neuropathy.  He is status post inferior myocardial infarction in February of 2004 while in [place]. This was treated with a drug-eluting stent to the right coronary. In 2005 he had some abnormality on a stress test and underwent repeat cardiac catheterization. This showed the stent to be widely patent and he had no other significant disease. His last stress test here was in July 2014 showed a fixed inferior wall scar and mild LV dysfunction.  He is  s/p CABG in [facility] on 11/11/15. H was admitted to [facility] in late Sept with chest pain. He ruled out for MI. CT of the chest without contrast showed some bronchitic changes. He  had an abnormal Myoview showing inferior and apical scar with ischemia in the anterior wall and apex. EF 28%.  He underwent cardiac cath 11/06/15. His cath showed a patent RCA stent but an occluded LAD and 70% stenosis in the OM1 and OM2.  He underwent CABG x 1 with LIMA-LAD 11/11/15 by Dr [name]. The OMs were felt to be too small for bypass. Post OP echo 11/13/15 showed his EF to be 40%. Carotid dopplers showed no obstructive disease. It was noted on cardiac cath that his right innominate artery was very tortuous.   On follow up today he is doing well from a cardiac standpoint. He denies any chest pain, SOB, palpitations, or dizziness. He has been diagnosed with prostate CAD ( reports it hasn't spread yet) and is considering his treatment options. He also notes some pain and limitation of motion in his left shoulder. He is walking regularly and trying to eat a healthy diet. No edema.  Past Medical  History:  Diagnosis Date  . Balanitis   . CAD (coronary artery disease)   . Depression   . Diabetes mellitus type II   . ED (erectile dysfunction)   . HTN (hypertension)   . Hyperlipidemia   . Old MI (myocardial infarction) 12/07/2013    Past Surgical History:  Procedure Laterality Date  . CORONARY ARTERY BYPASS GRAFT  10/2015  . UMBILICAL HERNIA REPAIR      Current Medications: Outpatient Medications Prior to Visit  Medication Sig Dispense Refill  . atorvastatin (LIPITOR) 40 MG tablet TAKE ONE TABLET BY MOUTH ONCE DAILY 90 tablet 1  . clotrimazole-betamethasone (LOTRISONE) cream APPLY  CREAM TOPICALLY TO AFFECTED AREA TWICE DAILY 45 g 1  . glucose blood (ONETOUCH VERIO) test strip 1 each by Other route 2 (two) times daily. Use to check blood sugars twice a day Dx E11.9 100 each 3  . INVOKANA 300 MG TABS tablet Take 1 tablet by mouth daily.    . metFORMIN (GLUCOPHAGE) 1000 MG tablet Take 1,000 mg by mouth daily with breakfast.    . ONETOUCH DELICA LANCETS FINE MISC Use to help check blood sugars daily Dx E11.9 100 each 3  . SitaGLIPtin-MetFORMIN HCl (JANUMET XR) [phone] MG TB24 1 po qd 30 tablet 11  . aspirin 325 MG tablet Take 325 mg by mouth daily.    . carvedilol (COREG) 6.25 MG tablet Take 6.25 mg by mouth 2 (  two) times daily with a meal.    . escitalopram (LEXAPRO) 5 MG tablet Take 1 tablet (5 mg total) by mouth daily. (Patient not taking: Reported on 02/12/2016) 30 tablet 5   No facility-administered medications prior to visit.      Allergies:   Patient has no known allergies.   Social History   Social History  . Marital status: Married    Spouse name: N/A  . Number of children: 1  . Years of education: N/A   Occupational History  . truck driver-self employed    Social History Main Topics  . Smoking status: Never Smoker  . Smokeless tobacco: Never Used  . Alcohol use No  . Drug use: No  . Sexual activity: Yes   Other Topics Concern  . None   Social  History Narrative  . None     Family History:  The patient's family history includes Cancer in his father; Diabetes in his mother and other.   ROS:   Please see the history of present illness.    ROS All other systems reviewed and are negative.   PHYSICAL EXAM:   VS:  BP 136/86 (BP Location: Left Arm, Patient Position: Sitting, Cuff Size: Normal)   Pulse 94   Ht 6' (1.829 m)   Wt 221 lb (100.2 kg)   BMI 29.97 kg/m    GEN: Well nourished, well developed, in no acute distress  HEENT: normal  Neck: no JVD, carotid bruits, or masses Cardiac: RRR; no murmurs, rubs, or gallops,no edema  Respiratory:  clear to auscultation bilaterally, normal work of breathing GI: soft, nontender, nondistended, + BS MS: no deformity or atrophy  Skin: warm and dry, no rash Neuro:  Alert and Oriented x 3, Strength and sensation are intact Psych: euthymic mood, full affect  Wt Readings from Last 3 Encounters:  02/12/16 221 lb (100.2 kg)  01/31/16 224 lb (101.6 kg)  12/04/15 221 lb (100.2 kg)      Studies/Labs Reviewed:   EKG:  EKG is not ordered today.   Recent Labs: 01/31/2016: ALT 23; BUN 17; Creatinine, Ser 1.00; Potassium 4.4; Sodium 140   Lipid Panel    Component Value Date/Time   CHOL 129 01/31/2016 1411   TRIG 167.0 (H) 01/31/2016 1411   HDL 47.20 01/31/2016 1411   CHOLHDL 3 01/31/2016 1411   VLDL 33.4 01/31/2016 1411   LDLCALC 49 01/31/2016 1411   LDLDIRECT 136.0 11/09/2014 1543    Additional studies/ records that were reviewed today include:  Records from [facility] as noted above.   ASSESSMENT:    1. Essential hypertension   2. CABG x 3 11/11/15 in [facility]   3. Cardiomyopathy, ischemic   4. Diabetes mellitus type 2 with neurological manifestations ([place])   5. Chronic systolic heart failure (HCC)      PLAN:  In order of problems listed above:  1. Patient has recovered well from CABG. He is cleared to do some lifting now. May reduce ASA to 81 mg daily. I  have requested a copy of cath films to review from [facility].  2. Chronic systolic CHF. Last EF post op showed EF 40%. He is asymptomatic. Will increase Coreg to 12.5 mg bid. Add losartan 25 mg daily. Plan to repeat Echo in 2-3 months to assess LV recovery post revascularization. 3. Dyslipidemia is well controlled.  4. Prostate CA- f/w with urology.  5. Should pain- most suggestive of rotator cuff strain. Conservative measures for now. If worsens  can see ortho.     Medication Adjustments/Labs and Tests Ordered: Current medicines are reviewed at length with the patient today.  Concerns regarding medicines are outlined above.  Medication changes, Labs and Tests ordered today are listed in the Patient Instructions below. Patient Instructions  Reduce ASA to 81 mg daily  Increase Carvedilol to 12.5 mg twice a day  Add losartan 25 mg daily to help strengthen your heart  I will see you in 2 months.      Signed, Lawrance Wiedemann Martinique, MD  02/12/2016 4:23 PM    [place] [address] [place], [place], [address] [phone]  Addendum: I personally reviewed his prior cardiac cath films on 11/07/15. This showed occlusion of the proximal LAD with some collateral. There was a large ramus vessel that bifurcated in the mid vessel. There was 40-50% disease at the bifurcation. The first OM was a moderate sized vessel with 80-90% stenosis. The second OM was smaller with a 70% stenosis. The RCA was patent in the mid vessel at site of prior stent. There was diffuse 40% disease in the proximal vessel. It is surprising that the OMs were too small for bypass. If he has refractory angina could be considered for PCI of OM1.  Brad Mcgaughy Martinique MD, [facility]  02/19/2016 3:42 PM.

## 2016-02-12 ENCOUNTER — Ambulatory Visit (INDEPENDENT_AMBULATORY_CARE_PROVIDER_SITE_OTHER): Payer: Medicare Other | Admitting: Cardiology

## 2016-02-12 ENCOUNTER — Encounter (INDEPENDENT_AMBULATORY_CARE_PROVIDER_SITE_OTHER): Payer: Self-pay

## 2016-02-12 ENCOUNTER — Encounter: Payer: Self-pay | Admitting: Cardiology

## 2016-02-12 VITALS — BP 136/86 | HR 94 | Ht 72.0 in | Wt 221.0 lb

## 2016-02-12 DIAGNOSIS — Z951 Presence of aortocoronary bypass graft: Secondary | ICD-10-CM

## 2016-02-12 DIAGNOSIS — I255 Ischemic cardiomyopathy: Secondary | ICD-10-CM | POA: Diagnosis not present

## 2016-02-12 DIAGNOSIS — I10 Essential (primary) hypertension: Secondary | ICD-10-CM | POA: Diagnosis not present

## 2016-02-12 DIAGNOSIS — E1149 Type 2 diabetes mellitus with other diabetic neurological complication: Secondary | ICD-10-CM

## 2016-02-12 DIAGNOSIS — I5022 Chronic systolic (congestive) heart failure: Secondary | ICD-10-CM

## 2016-02-12 MED ORDER — LOSARTAN POTASSIUM 25 MG PO TABS: 25.0000 mg | ORAL_TABLET | Freq: Every day | ORAL | 3 refills | Status: DC

## 2016-02-12 MED ORDER — CARVEDILOL 12.5 MG PO TABS: 12.5000 mg | ORAL_TABLET | Freq: Two times a day (BID) | ORAL | 3 refills | Status: DC

## 2016-02-12 MED ORDER — ASPIRIN 81 MG PO CHEW: 81.0000 mg | CHEWABLE_TABLET | Freq: Once | ORAL | Status: DC

## 2016-02-12 NOTE — Patient Instructions (Signed)
Reduce ASA to 81 mg daily  Increase Carvedilol to 12.5 mg twice a day  Add losartan 25 mg daily to help strengthen your heart  I will see you in 2 months.

## 2016-02-25 ENCOUNTER — Telehealth: Payer: Self-pay | Admitting: Medical Oncology

## 2016-02-25 ENCOUNTER — Encounter: Payer: Self-pay | Admitting: Medical Oncology

## 2016-02-25 NOTE — Telephone Encounter (Signed)
I called pt to introduce myself as the Prostate Nurse Navigator and the Coordinator of the Prostate Pinch.  1. I confirmed with the patient he is aware of his referral to the clinic 03/03/16 arriving at 12:15pm.  2. I discussed the format of the clinic and the physicians he will be seeing that day.  3. I discussed where the clinic is located and how to contact me.  4. I confirmed his address and informed him I would be mailing a packet of information and forms to be completed. I asked him to bring them with him the day of his appointment.   He voiced understanding of the above. I asked him to call me if he has any questions or concerns regarding his appointments or the forms he needs to complete.

## 2016-03-02 ENCOUNTER — Telehealth: Payer: Self-pay | Admitting: Medical Oncology

## 2016-03-02 NOTE — Telephone Encounter (Signed)
Clinton Kirby confirmed his appointment with Prostate [facility] 03/03/16 arriving at 12:15pm. I reminded him to bring his completed medical forms and he voiced understanding.

## 2016-03-03 ENCOUNTER — Ambulatory Visit
Admission: RE | Admit: 2016-03-03 | Discharge: 2016-03-03 | Disposition: A | Discharge status: Home / Self Care | Payer: Medicare Other | Source: Ambulatory Visit | Attending: Radiation Oncology | Admitting: Radiation Oncology

## 2016-03-03 ENCOUNTER — Ambulatory Visit (HOSPITAL_BASED_OUTPATIENT_CLINIC_OR_DEPARTMENT_OTHER): Payer: Medicare Other | Admitting: Oncology

## 2016-03-03 ENCOUNTER — Encounter: Payer: Self-pay | Admitting: Radiation Oncology

## 2016-03-03 ENCOUNTER — Encounter: Payer: Self-pay | Admitting: Medical Oncology

## 2016-03-03 VITALS — BP 137/77 | HR 83 | Temp 98.4°F | Resp 18 | Ht 72.0 in | Wt 224.0 lb

## 2016-03-03 DIAGNOSIS — C61 Malignant neoplasm of prostate: Secondary | ICD-10-CM | POA: Insufficient documentation

## 2016-03-03 DIAGNOSIS — I252 Old myocardial infarction: Secondary | ICD-10-CM | POA: Insufficient documentation

## 2016-03-03 DIAGNOSIS — Z7982 Long term (current) use of aspirin: Secondary | ICD-10-CM | POA: Insufficient documentation

## 2016-03-03 DIAGNOSIS — I509 Heart failure, unspecified: Secondary | ICD-10-CM | POA: Diagnosis not present

## 2016-03-03 DIAGNOSIS — E119 Type 2 diabetes mellitus without complications: Secondary | ICD-10-CM

## 2016-03-03 DIAGNOSIS — I10 Essential (primary) hypertension: Secondary | ICD-10-CM | POA: Diagnosis not present

## 2016-03-03 DIAGNOSIS — Z833 Family history of diabetes mellitus: Secondary | ICD-10-CM | POA: Insufficient documentation

## 2016-03-03 DIAGNOSIS — Z809 Family history of malignant neoplasm, unspecified: Secondary | ICD-10-CM | POA: Diagnosis not present

## 2016-03-03 DIAGNOSIS — I110 Hypertensive heart disease with heart failure: Secondary | ICD-10-CM | POA: Insufficient documentation

## 2016-03-03 DIAGNOSIS — E7800 Pure hypercholesterolemia, unspecified: Secondary | ICD-10-CM | POA: Diagnosis not present

## 2016-03-03 DIAGNOSIS — I2510 Atherosclerotic heart disease of native coronary artery without angina pectoris: Secondary | ICD-10-CM | POA: Insufficient documentation

## 2016-03-03 DIAGNOSIS — R972 Elevated prostate specific antigen [PSA]: Secondary | ICD-10-CM | POA: Diagnosis not present

## 2016-03-03 HISTORY — DX: Malignant neoplasm of prostate: C61

## 2016-03-03 NOTE — Progress Notes (Signed)
START ON PATHWAY REGIMEN - Prostate  POS82: Radiation + Neoadjuvant/Concurrent/Adjuvant LHRH Agonist Starting 2 - 4 Months Prior to Radiation for a Total of 18 - 36 Months (+/- Nonsteroidal Antiandrogen During XRT)   A cycle is every 12 weeks:     Leuprolide acetate depot (Lupron(R)) 22.5 mg flat dose intramuscularly every 12 weeks Dose Mod: None   Daily:     Bicalutamide (Casodex(R)) 50 mg orally once a day Dose Mod: None  **Always confirm dose/schedule in your pharmacy ordering system**    Patient Characteristics: Adenocarcinoma, Clinically Localized Disease, High Risk, Radiation Candidate AJCC T Stage: 1c AJCC Stage Grouping: IIB Current radiographic evidence of distant metastasis? No PSA: >= 20 Gleason Primary: 5 Gleason Secondary: 4 Gleason Score: 9 AJCC M Stage: 0 AJCC N Stage: 0 Risk Status: High Risk Treatment: Radiation Candidate  Intent of Therapy: Curative Intent, Discussed with Patient

## 2016-03-03 NOTE — Consult Note (Signed)
[facility]     03/03/2016   --------------------------------------------------------------------------------   Jonn Shingles  MRN: [id number]  PRIMARY CARE:  Evie Lacks. Plotnikov, MD  DOB: 1945-07-18, 71 year old Male  REFERRING:  Aleksei V. Plotnikov, MD  SSN: [id number]  PROVIDER:  Franchot Gallo, M.D.    TREATING:  [name], M.D.    LOCATION:  [facility] [phone]   --------------------------------------------------------------------------------   CC/HPI: CC: Prostate Cancer   Physician requesting consult: Dr. Cranston Neighbor  PCP: Dr. Walker Kehr  Location of consult: [facility]   Mr. Wiler is a 70 year old gentleman who was initially noted to have a PSA of 41.53 in September 2016 during a routine visit to Dr. Alain Marion. He was recommend to see urology but was non-compliant with his scheduled appointments and did not see Dr. Diona Fanti for his initial consultation until August 2017 when his PSA was repeated and remained elevated at 37.4. He was again non-compliant but eventually did proceed with a TRUS biopsy of the prostate on 01/20/16 which confirmed Gleason 4+5=9 adenocarcinoma of the prostate with 7 out of 12 biopsy cores positive for malignancy.   Family history: None.   Imaging studies:  CT abdomen/pelvis (02/07/16) - negative  Bone scan (02/07/16) - negative   PMH: He has a history of diabetes, hypercholesterolemia, CAD s/p MI, and hypertension.  PSH: Umbilical hernia repair.   TNM stage: cT1c N0 M0  PSA: 37.4  Gleason score: 4+5=9  Biopsy (01/20/16): 7/12 cores positive  Left: L lateral apex (80%, 4+5=9), L apex (70%, 4+5=9), L lateral mid (95%, 4+5=9), L mid (90%, 4+4=8), L lateral base (90%, 4+4=8), L base (90%, 4+5=9, PNI)  Right: R base (90%, 4+5=9)  Prostate volume: 30.5 cc   Nomogram  OC disease: 3%  EPE: 95%  SVI: 57%  LNI: 57%  PFS (5 year,  10 year): 11%, 6%   Urinary function: IPSS is 3.  Erectile function: SHIM score is 4.     ALLERGIES: No Allergies    MEDICATIONS: Levaquin 500 mg tablet Take 1 po the day before procedure, 1 PO the day of procedure, 1 PO the day after procedure  Aspirin 325 MG Oral Tablet Oral  Atorvastatin Calcium 40 mg tablet  Duloxetine Hcl 60 mg capsule,delayed release  Invokana 300 mg tablet  Vitamin D     GU PSH: Locm 300-399Mg/Ml Iodine,1Ml - 02/07/2016 Prostate Needle Biopsy - 01/20/2016 Rpr Umbil Hern; Reduc < 5 Yr - 2011      PSH Notes: Umbilical Hernia Repair   NON-GU PSH: Surgical Pathology, Gross And Microscopic Examination For Prostate Needle - 01/20/2016    GU PMH: Balanitis (Stable) - 09/24/2015, Balanitis, - 2014 Elevated PSA - 09/24/2015 Phimosis - 09/24/2015 Nocturia, Nocturia - 2014 Other microscopic hematuria, Microscopic hematuria - 2014 Urinary Frequency, Increased urinary frequency - 2014      PMH Notes:  1898-02-09 00:00:00 - Note: Normal Routine History And Physical Adult  2009-04-30 15:32:51 - Note: Acute Myocardial Infarction   NON-GU PMH: Personal history of other diseases of the circulatory system, History of hypertension - 2014 Personal history of other endocrine, nutritional and metabolic disease, History of diabetes mellitus - 2014, History of hypercholesterolemia, - 2014 Heart failure, unspecified    FAMILY HISTORY: Cancer - Father Death In The Family Father - Father Death In The Family Mother - Mother Diabetes - Mother   SOCIAL HISTORY: Marital Status: Married Current Smoking Status: Patient has  never smoked.  Has never drank.  Drinks 1 caffeinated drink per day.    REVIEW OF SYSTEMS:    GU Review Male:   Patient denies leakage of urine, burning/ pain with urination, have to strain to urinate , stream starts and stops, hard to postpone urination, frequent urination, get up at night to urinate, and trouble starting your streams.  Gastrointestinal  (Upper):   Patient denies nausea and vomiting.  Gastrointestinal (Lower):   Patient denies diarrhea and constipation.  Constitutional:   Patient denies fever, night sweats, weight loss, and fatigue.  Skin:   Patient denies skin rash/ lesion and itching.  Eyes:   Patient denies blurred vision and double vision.  Ears/ Nose/ Throat:   Patient denies sore throat and sinus problems.  Hematologic/Lymphatic:   Patient denies swollen glands and easy bruising.  Cardiovascular:   Patient denies leg swelling and chest pains.  Respiratory:   Patient denies cough and shortness of breath.  Endocrine:   Patient denies excessive thirst.  Musculoskeletal:   Patient denies back pain and joint pain.  Neurological:   Patient denies headaches and dizziness.  Psychologic:   Patient denies depression and anxiety.   VITAL SIGNS: None   MULTI-SYSTEM PHYSICAL EXAMINATION:    Constitutional: Well-nourished. No physical deformities. Normally developed. Good grooming.     PAST DATA REVIEWED:  Source Of History:  Patient  Lab Test Review:   PSA  Records Review:   Pathology Reports  X-Ray Review: C.T. Abdomen/Pelvis: Reviewed Films.  Bone Scan: Reviewed Films.     09/24/15 11/09/14  PSA  Total PSA 37.40  41.53 ng/dl    PROCEDURES: None   ASSESSMENT:      ICD-10 Details  1 GU:   Prostate Cancer - C61    PLAN:           Document Letter(s):  Created for Patient: Clinical Summary         Notes:   1. High risk localized prostate cancer: Mr. Eisner has met with Dr. Ledon Snare and Dr. Zola Button earlier this afternoon. He has elected to proceed with radiation therapy and long-term androgen deprivation.   We did review the natural history of prostate cancer and, in particular, is very high risk prostate cancer. We discussed treatment options and the pros and cons of these options with regard to cancer control, urinary function, erectile function, and bowel function. He is very well-informed to his  prior discussions with Dr. Diona Fanti, Dr. Tammi Klippel, and Dr. Alen Blew.   Although we did discuss primary surgical therapy as an option as part of multimodality treatment, he does have a history of coronary artery disease and appropriately is concerned about the potential risk of surgery. We did review the potential side effect profile associated with primary radiation therapy and androgen deprivation therapy in detail. I answered all questions to his stated satisfaction. I will plan to notify Dr. Diona Fanti of his treatment decisions at that he can begin androgen deprivation and proceed with fiducial marker placement in anticipation of radiation therapy.   Cc: Dr. Franchot Gallo  Dr. Cristie Hem Plotnikov  Dr. Tyler Pita  Dr. Zola Button

## 2016-03-03 NOTE — Progress Notes (Signed)
Radiation Oncology         [phone] ________________________________  Multidisciplinary Prostate Cancer Clinic  Initial Radiation Oncology Consultation  Name: Clinton Kirby MRN: [id number]  Date: 03/03/2016  DOB: 1945-06-11  [id number] Plotnikov, MD  [name], MD   REFERRING PHYSICIAN: [name], MD  DIAGNOSIS: 71 y.o. gentleman with stage T1c adenocarcinoma of the prostate with a Gleason's score of 4+5 and a PSA of 41.53    ICD-9-CM ICD-10-CM   1. Malignant neoplasm of prostate ([place]) 185 C61   2. Prostate cancer ([place]) [place] ILLNESS::Clinton Kirby is a 71 y.o. gentleman.  He was noted to have an elevated PSA of 41.53 by his primary care physician, Dr. Alain Marion.  Accordingly, he was referred for evaluation in urology by Dr. Diona Fanti.  The patient proceeded to transrectal ultrasound with 12 biopsies of the prostate on 01/20/2016.  The prostate volume measured 30.53 cc.  Out of 12 core biopsies, 7 were positive.  The maximum Gleason score was 4+5, and this was seen in left mid lateral, left apex lateral, left base, left apex, and right base.  Bone scan on 02/07/2016 showed no suspicious activity to indicate osseous metastatic disease. Probable degenerative change in the left shoulder, thoracic spine.  The patient reviewed the biopsy results with his urologist and he has kindly been referred today to the multidisciplinary prostate cancer clinic for presentation of pathology and radiology studies in our conference for discussion of potential radiation treatment options and clinical evaluation.       PREVIOUS RADIATION THERAPY: No  PAST MEDICAL HISTORY:  has a past medical history of Balanitis; CAD (coronary artery disease); Depression; Diabetes mellitus type II; ED (erectile dysfunction); HTN (hypertension); Hyperlipidemia; Old MI (myocardial infarction) (12/07/2013); and Prostate cancer ([place]).    PAST SURGICAL HISTORY: Past Surgical  History:  Procedure Laterality Date  . CORONARY ARTERY BYPASS GRAFT  10/2015  . PROSTATE BIOPSY    . UMBILICAL HERNIA REPAIR      FAMILY HISTORY: family history includes Cancer in his father; Diabetes in his mother and other.  SOCIAL HISTORY:  reports that he has never smoked. He has never used smokeless tobacco. He reports that he does not drink alcohol or use drugs.  ALLERGIES: Patient has no known allergies.  MEDICATIONS:  Current Outpatient Prescriptions  Medication Sig Dispense Refill  . atorvastatin (LIPITOR) 40 MG tablet TAKE ONE TABLET BY MOUTH ONCE DAILY 90 tablet 1  . carvedilol (COREG) 12.5 MG tablet Take 1 tablet (12.5 mg total) by mouth 2 (two) times daily. 180 tablet 3  . clotrimazole-betamethasone (LOTRISONE) cream APPLY  CREAM TOPICALLY TO AFFECTED AREA TWICE DAILY 45 g 1  . glucose blood (ONETOUCH VERIO) test strip 1 each by Other route 2 (two) times daily. Use to check blood sugars twice a day Dx E11.9 100 each 3  . INVOKANA 300 MG TABS tablet Take 1 tablet by mouth daily.    [name] losartan (COZAAR) 50 MG tablet Take 1 tablet (50 mg total) by mouth daily. 90 tablet 3  . metFORMIN (GLUCOPHAGE) 1000 MG tablet Take 1,000 mg by mouth daily with breakfast.    . ONETOUCH DELICA LANCETS FINE MISC Use to help check blood sugars daily Dx E11.9 100 each 3  . SitaGLIPtin-MetFORMIN HCl (JANUMET XR) [phone] MG TB24 1 po qd 30 tablet 11   Current Facility-Administered Medications  Medication Dose Route Frequency Provider Last Rate Last Dose  . aspirin chewable tablet 81  mg  81 mg Oral Once Peter M Martinique, MD        REVIEW OF SYSTEMS:  A 15 point review of systems is documented in the electronic medical record. This was obtained by the nursing staff. However, I reviewed this with the patient to discuss relevant findings and make appropriate changes. The patient completed an IPSS and IIEF questionnaire.  His IPSS score was 3 indicating mild urinary outflow obstructive symptoms  including, nocturia.  He indicated that his erectile function is not able to complete sexual activity.  On review of systems, the patient reports that he is doing well overall. He denies any chest pain, shortness of breath, cough, fevers, chills, night sweats, unintended weight changes. He denies any bowel disturbances, and denies abdominal pain, nausea or vomiting. Phimosis and balanitis treatment with topical antifungal agent. He denies any new musculoskeletal or joint aches or pains, new skin lesions or concerns. A complete review of systems is obtained and is otherwise negative.   PHYSICAL EXAM:    height is 6' (1.829 m) and weight is 224 lb (101.6 kg). His oral temperature is 98.4 F (36.9 C). His blood pressure is 137/77 and his pulse is 83. His respiration is 18 and oxygen saturation is 100%.   Please note the digital rectal exam findings described above.  In general this is a well appearing [place] male in no acute distress. He is alert and oriented x4 and appropriate throughout the examination.   KPS = 100  100 - Normal; no complaints; no evidence of disease. 90   - Able to carry on normal activity; minor signs or symptoms of disease. 80   - Normal activity with effort; some signs or symptoms of disease. 37   - Cares for self; unable to carry on normal activity or to do active work. 60   - Requires occasional assistance, but is able to care for most of his personal needs. 50   - Requires considerable assistance and frequent medical care. 77   - Disabled; requires special care and assistance. 34   - Severely disabled; hospital admission is indicated although death not imminent. 19   - Very sick; hospital admission necessary; active supportive treatment necessary. 10   - Moribund; fatal processes progressing rapidly. 0     - Dead  Karnofsky DA, Abelmann Hamilton, Craver LS and Burchenal [facility] [phone]) The use of the nitrogen mustards in the palliative treatment of carcinoma: with  particular reference to bronchogenic carcinoma Cancer 1 [phone]   LABORATORY DATA:  Lab Results  Component Value Date   WBC 7.6 11/09/2014   HGB 16.7 11/09/2014   HCT 49.5 11/09/2014   MCV 89.7 11/09/2014   PLT 303.0 11/09/2014   Lab Results  Component Value Date   NA 140 01/31/2016   K 4.4 01/31/2016   CL 102 01/31/2016   CO2 30 01/31/2016   Lab Results  Component Value Date   ALT 23 01/31/2016   AST 17 01/31/2016   ALKPHOS 57 01/31/2016   BILITOT 0.5 01/31/2016     RADIOGRAPHY: No results found.    IMPRESSION: This gentleman is a 71 year-old gentleman with 71 year-old gentleman with stage T1c adenocarcinoma of the prostate with a Gleason's score of 4+5 and a PSA of 41.53.  His T-Stage, Gleason's Score, and PSA put him into the high risk group.  Accordingly he is eligible for a variety of potential treatment options including external beam radiation treatment, hormone therapy, and prostatectomy.  PLAN: Today I reviewed the findings and workup thus far.  We discussed the natural history of prostate cancer.  We reviewed the the implications of T-stage, Gleason's Score, and PSA on decision-making and outcomes in prostate cancer.  We discussed radiation treatment in the management of prostate cancer with regard to the logistics and delivery of external beam radiation treatment as well as the logistics and delivery of hormone therapy. We compared and contrasted each of these approaches and also compared these against prostatectomy. The patient is leaning away from surgical intervention because of cardiac history. The patient expressed interest in external beam radiotherapy.   The patient would like to proceed with prostate IMRT.  I will share my findings with Dr. Alinda Money and move forward with scheduling placement of three gold fiducial markers into the prostate to proceed with IMRT in the near future.     I enjoyed meeting with him today, and will look forward to participating in the  care of this very nice gentleman.   I spent 60 minutes face to face with the patient and more than 50% of that time was spent in counseling and/or coordination of care.   ------------------------------------------------  Sheral Apley. Tammi Klippel, M.D.   This document serves as a record of services personally performed by Tyler Pita, MD. It was created on his behalf by Arlyce Harman, a trained medical scribe. The creation of this record is based on the scribe's personal observations and the provider's statements to them. This document has been checked and approved by the attending provider.

## 2016-03-03 NOTE — Progress Notes (Signed)
Reason for Referral: Prostate cancer.   HPI: 71 year old gentleman currently of [place], [place]. 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 He is a pleasant gentleman with history of coronary artery disease, diabetes and hypertension. He was found to have an elevated PSA up to 41 in August 2017. Repeat PSA obtained by Dr. Luberta Robertson was noted to be 37.4 in August 2017. Prostate biopsy obtained on 01/20/2016 showed Gleason score 4+5 = 9 and 7 cores. He denied any urinary symptoms at this time including no frequency, hesitancy or urgency. He remains reasonably active and perform activities of daily living. Imaging studies did not show any evidence of metastatic disease including CT scan and bone scan.  He has not reported any headaches, blurry vision, syncope or seizures. He is not report any fevers, chills or sweats. He does not report any cough, wheezing or hemoptysis. He does not report any nausea, vomiting or abdominal pain. He does not report any frequency urgency or hesitancy. He does not report any skeletal complaints. Remaining review of systems unremarkable.   Past Medical History:  Diagnosis Date  . Balanitis   . CAD (coronary artery disease)   . Depression   . Diabetes mellitus type II   . ED (erectile dysfunction)   . HTN (hypertension)   . Hyperlipidemia   . Old MI (myocardial infarction) 12/07/2013  . Prostate cancer [facility])   :  Past Surgical History:  Procedure Laterality Date  . CORONARY ARTERY BYPASS GRAFT  10/2015  . PROSTATE BIOPSY    . UMBILICAL HERNIA REPAIR    :   Current Outpatient Prescriptions:  .  atorvastatin (LIPITOR) 40 MG tablet, TAKE ONE TABLET BY MOUTH ONCE DAILY, Disp: 90 tablet, Rfl: 1 .  carvedilol (COREG) 12.5 MG tablet, Take 1 tablet (12.5 mg total) by mouth 2 (two) times daily., Disp: 180 tablet, Rfl: 3 .  clotrimazole-betamethasone (LOTRISONE) cream, APPLY  CREAM TOPICALLY TO AFFECTED AREA TWICE DAILY, Disp: 45 g,  Rfl: 1 .  glucose blood (ONETOUCH VERIO) test strip, 1 each by Other route 2 (two) times daily. Use to check blood sugars twice a day Dx E11.9, Disp: 100 each, Rfl: 3 .  INVOKANA 300 MG TABS tablet, Take 1 tablet by mouth daily., Disp: , Rfl:  .  losartan (COZAAR) 25 MG tablet, Take 1 tablet (25 mg total) by mouth daily., Disp: 90 tablet, Rfl: 3 .  metFORMIN (GLUCOPHAGE) 1000 MG tablet, Take 1,000 mg by mouth daily with breakfast., Disp: , Rfl:  .  ONETOUCH DELICA LANCETS FINE MISC, Use to help check blood sugars daily Dx E11.9, Disp: 100 each, Rfl: 3 .  SitaGLIPtin-MetFORMIN HCl (JANUMET XR) [phone] MG TB24, 1 po qd, Disp: 30 tablet, Rfl: 11  Current Facility-Administered Medications:  .  aspirin chewable tablet 81 mg, 81 mg, Oral, Once, Peter M Martinique, MD:  No Known Allergies:  Family History  Problem Relation Age of Onset  . Diabetes Mother   . Cancer Father     ?  . Diabetes Other   :  Social History   Social History  . Marital status: Married    Spouse name: N/A  . Number of children: 1  . Years of education: N/A   Occupational History  . truck driver-self employed    Social History Main Topics  . Smoking status: Never Smoker  . Smokeless tobacco: Never Used  . Alcohol use No  . Drug use: No  . Sexual activity: Yes  Other Topics Concern  . Not on file   Social History Narrative  . No narrative on file  :  Pertinent items are noted in HPI.  Exam:  General appearance: alert and cooperative Throat: lips, mucosa, and tongue normal; teeth and gums normal Neck: no adenopathy Back: negative Resp: clear to auscultation bilaterally Chest wall: no tenderness Cardio: regular rate and rhythm, S1, S2 normal, no murmur, click, rub or gallop GI: soft, non-tender; bowel sounds normal; no masses,  no organomegaly Extremities: extremities normal, atraumatic, no cyanosis or edema Pulses: 2+ and symmetric Skin: Skin color, texture, turgor normal. No rashes or  lesions  CBC    Component Value Date/Time   WBC 7.6 11/09/2014 1543   RBC 5.52 11/09/2014 1543   HGB 16.7 11/09/2014 1543   HCT 49.5 11/09/2014 1543   PLT 303.0 11/09/2014 1543   MCV 89.7 11/09/2014 1543   MCHC 33.7 11/09/2014 1543   RDW 14.5 11/09/2014 1543   LYMPHSABS 1.9 11/09/2014 1543   MONOABS 0.6 11/09/2014 1543   EOSABS 0.5 11/09/2014 1543   BASOSABS 0.1 11/09/2014 1543     Nm Bone Scan Whole Body  Result Date: 02/07/2016 CLINICAL DATA:  Prostate cancer, elevated PSA 37.4 09/24/15, LT shoulder pain, CABG 2 months ago +DM EXAM: NUCLEAR MEDICINE WHOLE BODY BONE SCAN TECHNIQUE: Whole body anterior and posterior images were obtained approximately 3 hours after intravenous injection of radiopharmaceutical. RADIOPHARMACEUTICALS:  20.9 mCi Technetium-11m MDP IV COMPARISON:  CT of the abdomen and pelvis on 02/07/2016 FINDINGS: Bilateral renal activity and bladder activity are present. Sternal activity is consistent with prior median sternotomy. Focal activity is present within both shoulders, left greater than right likely degenerative. There is maxillary activity, right greater than left likely related to benign dental disease. Mid to lower thoracic activity, likely degenerative. No suspicious osseous remodeling to indicate presence of metastatic disease. IMPRESSION: 1. No suspicious activity to indicate osseous metastatic disease. 2. Probable degenerative change in the left shoulder, thoracic spine. Electronically Signed   By: Nolon Nations M.D.   On: 02/07/2016 15:58    Assessment and Plan:   71 year old gentleman with prostate cancer diagnosed in December 2017. He is a Gleason score 5+4 = 9 with PSA of 37.4. He is case was discussed today in the prostate cancer multidisciplinary clinic. Imaging studies discussed with radiology and showed no evidence of metastatic disease. His pathology was also discussed with the reviewing pathologist.  These options includes primary surgical  therapy versus radiation therapy on long-term androgen deprivation. Given his cardiac disease, other comorbid conditions radiation therapy may be a preferable option with 18 months of androgen deprivation. Constipation associated with this therapy were reviewed today which include hot flashes, weight gain, erectile dysfunction among others. He will also discuss surgical options with Dr. Alinda Money for completeness sake.  No role for additional systemic therapy at this time given the lack of metastatic disease noted. All his questions were answered today to his satisfaction.

## 2016-03-03 NOTE — Progress Notes (Signed)
                               Care Plan Summary  Name: Clinton Kirby DOB: 07-26-1945   Your Medical Team:   Urologist -  Dr. [name], [facility]  Radiation Oncologist - Dr. Tyler Pita, [facility]   Medical Oncologist - Dr. Zola Button, [place]  Recommendations: 1) Androgen Deprivation (hormone injections) 2) Radiation  Treatment  * These recommendations are based on information available as of today's consult.      Recommendations may change depending on the results of further tests or exams.  Next Steps: 1) Dr. Diona Fanti will schedule hormone injection  2) Dr.Manning's office will schedule radiation treatments   When appointments need to be scheduled, you will be contacted by [facility] and/or [facility].  Questions?  Please do not hesitate to call Cira Rue, RN, BSN, OCN at [phone] any questions or concerns.  Shirlean Mylar is your Oncology Nurse Navigator and is available to assist you while you're receiving your medical care at [facility].

## 2016-03-03 NOTE — Progress Notes (Signed)
GU Location of Tumor / Histology: prostatic adenocarcinoma  If Prostate Cancer, Gleason Score is (4 + 5) and PSA is (37.40) on 09/2015. Septmber 2016 PSA 41.53. Prostate volume: 30.53 grams.  Clinton Kirby underwent PSA screening in September 2016. At that time Dr. Alain Marion recommended urological consult. Unfortunately patient did not show until August 2017.  Biopsies of prostate (if applicable) revealed:    Past/Anticipated interventions by urology, if any: biopsy and referral to [facility]  Past/Anticipated interventions by medical oncology, if any: no  Weight changes, if any: no  Bowel/Bladder complaints, if any: phimosis and balanitis tx with topical antifungal agent, nocturia x 4, difficulty emptying his bladder completely, urinary frequency, weak urine stream   Nausea/Vomiting, if any: no  Pain issues, if any:  no  SAFETY ISSUES:  Prior radiation? no  Pacemaker/ICD? no  Possible current pregnancy? no  Is the patient on methotrexate? no  Current Complaints / other details:  71 year old male. Married.  Father with hx of cancer.

## 2016-03-04 ENCOUNTER — Encounter: Payer: Self-pay | Admitting: General Practice

## 2016-03-04 NOTE — Progress Notes (Signed)
[place] Psychosocial Distress Screening Spiritual Care  Met with Clinton Kirby in [facility] to introduce [facility] [place] team/resources, reviewing distress screen per protocol.  The patient scored a 1 on the Psychosocial Distress Thermometer which indicates mild distress. Also assessed for distress and other psychosocial needs.   ONCBCN DISTRESS SCREENING 03/04/2016  Screening Type Initial Screening  Distress experienced in past week (1-10) 1  Information Concerns Type Lack of info about diagnosis;Lack of info about treatment;Lack of info about complementary therapy choices;Lack of info about maintaining fitness  Physical Problem type Tingling hands/feet  Referral to support programs Yes   Clinton Kirby notes that [facility] helped resolve his information concerns and that he generally has minimal distress.  Per pt, faith is a primary source of meaning and coping.  He notes that father died when he was 57, and then pt himself has recovered from heart attacks and significant surgery; per pt, God has been with him through all of these challenges, helping him feel strong through prostate ca dx also.   Follow up needed: No.  Per pt, no other needs or concerns at this time.  He has full packet of [place] and knows to call as need/interest arises.    [place], [place], [facility] Pager [phone] Voicemail [phone]

## 2016-03-05 ENCOUNTER — Telehealth: Payer: Self-pay | Admitting: Internal Medicine

## 2016-03-05 MED ORDER — SITAGLIP PHOS-METFORMIN HCL ER 100-1000 MG PO TB24: ORAL_TABLET | ORAL | 11 refills | Status: DC

## 2016-03-05 NOTE — Telephone Encounter (Signed)
Pt is needing more samples Janumet XR, please call if we have more for him, thanks MS

## 2016-03-05 NOTE — Telephone Encounter (Signed)
I don't - sorry! AP

## 2016-03-05 NOTE — Telephone Encounter (Signed)
Pt informed - he is requesting new rx be sent to his pharmacy. Done. See meds.

## 2016-03-12 NOTE — Progress Notes (Signed)
Cardiology Office Note    Date:  03/13/2016   ID:  Clinton Kirby, Clinton Kirby [birth year], MRN [id number]  PCP:  Walker Kehr, MD  Cardiologist:  Zerina Hallinan Martinique, MD    History of Present Illness:  Clinton Kirby is a 71 y.o. male with known history of coronary disease and  poorly controlled diabetes mellitus type 2 as well as hyperlipidemia, hypertension and peripheral neuropathy.  He is status post inferior myocardial infarction in February of 2004 while in [place]. This was treated with a drug-eluting stent to the right coronary. In 2005 he had some abnormality on a stress test and underwent repeat cardiac catheterization. This showed the stent to be widely patent and he had no other significant disease. His last stress test here was in July 2014 showed a fixed inferior wall scar and mild LV dysfunction.  He is  s/p CABG in [facility] on 11/11/15. H was admitted to [facility] in late Sept with chest pain. He ruled out for MI. CT of the chest without contrast showed some bronchitic changes. He  had an abnormal Myoview showing inferior and apical scar with ischemia in the anterior wall and apex. EF 28%.  He underwent cardiac cath 11/06/15. His cath showed a patent RCA stent but an occluded LAD and 70% stenosis in the OM1 and OM2.  He underwent CABG x 1 with LIMA-LAD 11/11/15 by Dr [name]. The OMs were felt to be too small for bypass. Post OP echo 11/13/15 showed his EF to be 40%. Carotid dopplers showed no obstructive disease. It was noted on cardiac cath that his right innominate artery was very tortuous.   Cath films reviewed personally by me:  11/07/15. This showed occlusion of the proximal LAD with some collateral. There was a large ramus vessel that bifurcated in the mid vessel. There was 40-50% disease at the bifurcation. The first OM was a moderate sized vessel with 80-90% stenosis. The second OM was smaller with a 70% stenosis. The RCA was patent in the mid vessel at site of prior stent.  There was diffuse 40% disease in the proximal vessel. It is surprising that the OMs were too small for bypass. If he has refractory angina could be considered for PCI of OM1.  On follow up today he is doing well from a cardiac standpoint. He denies any chest pain, SOB, palpitations, or dizziness. He has been diagnosed with prostate CAD has started hormonal treatment for this.  He also notes some pain and limitation of motion in his left shoulder. He is no longer walking regularly - states he has no motivation.  No edema.  Past Medical History:  Diagnosis Date  . Balanitis   . CAD (coronary artery disease)   . Depression   . Diabetes mellitus type II   . ED (erectile dysfunction)   . HTN (hypertension)   . Hyperlipidemia   . Old MI (myocardial infarction) 12/07/2013  . Prostate cancer [facility])     Past Surgical History:  Procedure Laterality Date  . CORONARY ARTERY BYPASS GRAFT  10/2015  . PROSTATE BIOPSY    . UMBILICAL HERNIA REPAIR      Current Medications: Outpatient Medications Prior to Visit  Medication Sig Dispense Refill  . atorvastatin (LIPITOR) 40 MG tablet TAKE ONE TABLET BY MOUTH ONCE DAILY 90 tablet 1  . carvedilol (COREG) 12.5 MG tablet Take 1 tablet (12.5 mg total) by mouth 2 (two) times daily. 180 tablet 3  . clotrimazole-betamethasone (LOTRISONE) cream APPLY  CREAM TOPICALLY TO AFFECTED AREA TWICE DAILY 45 g 1  . glucose blood (ONETOUCH VERIO) test strip 1 each by Other route 2 (two) times daily. Use to check blood sugars twice a day Dx E11.9 100 each 3  . INVOKANA 300 MG TABS tablet Take 1 tablet by mouth daily.    . metFORMIN (GLUCOPHAGE) 1000 MG tablet Take 1,000 mg by mouth daily with breakfast.    . ONETOUCH DELICA LANCETS FINE MISC Use to help check blood sugars daily Dx E11.9 100 each 3  . SitaGLIPtin-MetFORMIN HCl (JANUMET XR) [phone] MG TB24 1 po qd 30 tablet 11  . losartan (COZAAR) 25 MG tablet Take 1 tablet (25 mg total) by mouth daily. 90 tablet 3    Facility-Administered Medications Prior to Visit  Medication Dose Route Frequency Provider Last Rate Last Dose  . aspirin chewable tablet 81 mg  81 mg Oral Once Julian Medina M Martinique, MD         Allergies:   Patient has no known allergies.   Social History   Social History  . Marital status: Married    Spouse name: N/A  . Number of children: 1  . Years of education: N/A   Occupational History  . truck driver-self employed    Social History Main Topics  . Smoking status: Never Smoker  . Smokeless tobacco: Never Used  . Alcohol use No  . Drug use: No  . Sexual activity: Yes   Other Topics Concern  . None   Social History Narrative  . None     Family History:  The patient's family history includes Cancer in his father; Diabetes in his mother and other.   ROS:   Please see the history of present illness.    ROS All other systems reviewed and are negative.   PHYSICAL EXAM:   VS:  BP 140/84 (BP Location: Right Arm, Patient Position: Sitting, Cuff Size: Normal)   Pulse 76   Ht 6' (1.829 m)   Wt 227 lb 3.2 oz (103.1 kg)   BMI 30.81 kg/m    GEN: Well nourished, well developed, in no acute distress  HEENT: normal  Neck: no JVD, carotid bruits, or masses Cardiac: RRR; no murmurs, rubs, or gallops,no edema  Respiratory:  clear to auscultation bilaterally, normal work of breathing GI: soft, nontender, nondistended, + BS MS: no deformity or atrophy  Skin: warm and dry, no rash Neuro:  Alert and Oriented x 3, Strength and sensation are intact Psych: euthymic mood, full affect  Wt Readings from Last 3 Encounters:  03/13/16 227 lb 3.2 oz (103.1 kg)  03/03/16 224 lb (101.6 kg)  02/12/16 221 lb (100.2 kg)      Studies/Labs Reviewed:   EKG:  EKG is ordered today. NSR with LAD, old inferior and anterior infarcts. I have personally reviewed and interpreted this study.   Recent Labs: 01/31/2016: ALT 23; BUN 17; Creatinine, Ser 1.00; Potassium 4.4; Sodium 140   Lipid  Panel    Component Value Date/Time   CHOL 129 01/31/2016 1411   TRIG 167.0 (H) 01/31/2016 1411   HDL 47.20 01/31/2016 1411   CHOLHDL 3 01/31/2016 1411   VLDL 33.4 01/31/2016 1411   LDLCALC 49 01/31/2016 1411   LDLDIRECT 136.0 11/09/2014 1543    Additional studies/ records that were reviewed today include:  Records from [facility] as noted above.   ASSESSMENT:    1. Essential hypertension   2. Hypercholesterolemia   3. Cardiomyopathy, ischemic   4.  CAD S/P percutaneous coronary angioplasty   5. Atherosclerosis of native coronary artery of native heart with angina pectoris ([place])      PLAN:  In order of problems listed above:  1. Patient has recovered well from CABG. Continue current therapy. If he were to have recurrent angina he would be a candidate for PCI of the OMs but he is currently asymptomatic. 2. Chronic systolic CHF. Last EF post op showed EF 40%. He is asymptomatic. Continue Coreg to 12.5 mg bid. Increase losartan 50 mg daily. Plan to repeat Echo now months to assess LV recovery post revascularization. 3. Dyslipidemia is well controlled.  4. Prostate CA- f/w with urology. On hormonal therapy 5. Should pain- most suggestive of rotator cuff strain. Conservative measures for now. If worsens can see ortho.  6. DM type 2. Now on Janumet.  I will follow up in 6 months.    Medication Adjustments/Labs and Tests Ordered: Current medicines are reviewed at length with the patient today.  Concerns regarding medicines are outlined above.  Medication changes, Labs and Tests ordered today are listed in the Patient Instructions below. Patient Instructions  Increase losartan to 50 mg daily  Continue your other therapy  Try and resume your walking  We will schedule you for an Echocardiogram  I will see you in 6 months     Signed, Keirstin Musil Martinique, MD  03/13/2016 3:57 PM    [facility] [address] [place], [place],  [address] [phone]

## 2016-03-13 ENCOUNTER — Ambulatory Visit (INDEPENDENT_AMBULATORY_CARE_PROVIDER_SITE_OTHER): Payer: Medicare Other | Admitting: Cardiology

## 2016-03-13 ENCOUNTER — Encounter: Payer: Self-pay | Admitting: Cardiology

## 2016-03-13 VITALS — BP 140/84 | HR 76 | Ht 72.0 in | Wt 227.2 lb

## 2016-03-13 DIAGNOSIS — E7800 Pure hypercholesterolemia, unspecified: Secondary | ICD-10-CM

## 2016-03-13 DIAGNOSIS — I2510 Atherosclerotic heart disease of native coronary artery without angina pectoris: Secondary | ICD-10-CM

## 2016-03-13 DIAGNOSIS — C61 Malignant neoplasm of prostate: Secondary | ICD-10-CM | POA: Diagnosis not present

## 2016-03-13 DIAGNOSIS — Z9861 Coronary angioplasty status: Secondary | ICD-10-CM

## 2016-03-13 DIAGNOSIS — I10 Essential (primary) hypertension: Secondary | ICD-10-CM | POA: Diagnosis not present

## 2016-03-13 DIAGNOSIS — I255 Ischemic cardiomyopathy: Secondary | ICD-10-CM | POA: Diagnosis not present

## 2016-03-13 DIAGNOSIS — I25119 Atherosclerotic heart disease of native coronary artery with unspecified angina pectoris: Secondary | ICD-10-CM

## 2016-03-13 DIAGNOSIS — Z5111 Encounter for antineoplastic chemotherapy: Secondary | ICD-10-CM | POA: Diagnosis not present

## 2016-03-13 MED ORDER — LOSARTAN POTASSIUM 50 MG PO TABS: 50.0000 mg | ORAL_TABLET | Freq: Every day | ORAL | 3 refills | Status: DC

## 2016-03-13 NOTE — Patient Instructions (Signed)
Increase losartan to 50 mg daily  Continue your other therapy  Try and resume your walking  We will schedule you for an Echocardiogram  I will see you in 6 months

## 2016-03-19 ENCOUNTER — Telehealth: Payer: Self-pay | Admitting: Medical Oncology

## 2016-03-19 NOTE — Progress Notes (Signed)
Left a voicemail to follow up post Prostate MDC.

## 2016-03-27 DIAGNOSIS — L03114 Cellulitis of left upper limb: Secondary | ICD-10-CM | POA: Diagnosis not present

## 2016-03-31 ENCOUNTER — Ambulatory Visit (HOSPITAL_COMMUNITY): Payer: Medicare Other | Attending: Cardiovascular Disease

## 2016-04-15 ENCOUNTER — Ambulatory Visit: Payer: Medicare Other | Admitting: Cardiology

## 2016-04-17 ENCOUNTER — Telehealth: Payer: Self-pay | Admitting: *Deleted

## 2016-04-17 MED ORDER — INVOKANA 300 MG PO TABS: 300.0000 mg | ORAL_TABLET | Freq: Every day | ORAL | 3 refills | Status: DC

## 2016-04-17 NOTE — Telephone Encounter (Signed)
Rec'd call pt states MD gave samples of Invokana, and he is almost out requesting rx to be sent to [facility]. Sent electronically...Clinton Kirby

## 2016-04-22 DIAGNOSIS — C61 Malignant neoplasm of prostate: Secondary | ICD-10-CM | POA: Diagnosis not present

## 2016-04-23 ENCOUNTER — Encounter: Payer: Self-pay | Admitting: *Deleted

## 2016-04-28 ENCOUNTER — Encounter: Payer: Self-pay | Admitting: Urology

## 2016-04-28 ENCOUNTER — Telehealth: Payer: Self-pay | Admitting: *Deleted

## 2016-04-28 NOTE — Telephone Encounter (Signed)
CALLED PATIENT TO INFORM OF GOLD SEED PLACEMENT ON 05-28-16- ARRIVAL TIME - 9:30 AM @ DR. DAHLSTEDT'S OFFICE AND HIS SIM ON 06-04-16 @10  AM @ DR. MANNING'S OFFICE, SPOKE WITH PATIENT AND HE IS AWARE OF THESE APPTS.

## 2016-04-28 NOTE — Progress Notes (Signed)
Clinton Kirby had 240mg  Mills Koller 03/13/16 and 4 month Lupron 04/22/16 with Dr. Diona Fanti.  He is to be scheduled for fiducial marker placement in the near future with plans for IMRT and LT ADT.  Will plan to schedule CT SIM once gold seeds have been placed.  -Amadi Frady

## 2016-05-01 ENCOUNTER — Other Ambulatory Visit (INDEPENDENT_AMBULATORY_CARE_PROVIDER_SITE_OTHER): Payer: Medicare Other

## 2016-05-01 ENCOUNTER — Encounter: Payer: Self-pay | Admitting: Internal Medicine

## 2016-05-01 ENCOUNTER — Ambulatory Visit (INDEPENDENT_AMBULATORY_CARE_PROVIDER_SITE_OTHER): Payer: Medicare Other | Admitting: Internal Medicine

## 2016-05-01 DIAGNOSIS — E1165 Type 2 diabetes mellitus with hyperglycemia: Secondary | ICD-10-CM

## 2016-05-01 DIAGNOSIS — I10 Essential (primary) hypertension: Secondary | ICD-10-CM | POA: Diagnosis not present

## 2016-05-01 DIAGNOSIS — E1149 Type 2 diabetes mellitus with other diabetic neurological complication: Secondary | ICD-10-CM

## 2016-05-01 DIAGNOSIS — C61 Malignant neoplasm of prostate: Secondary | ICD-10-CM | POA: Diagnosis not present

## 2016-05-01 DIAGNOSIS — IMO0002 Reserved for concepts with insufficient information to code with codable children: Secondary | ICD-10-CM

## 2016-05-01 DIAGNOSIS — Z951 Presence of aortocoronary bypass graft: Secondary | ICD-10-CM | POA: Diagnosis not present

## 2016-05-01 LAB — HEMOGLOBIN A1C: HEMOGLOBIN A1C: 8.3 % — AB (ref 4.6–6.5)

## 2016-05-01 LAB — BASIC METABOLIC PANEL
BUN: 18 mg/dL (ref 6–23)
CALCIUM: 9.5 mg/dL (ref 8.4–10.5)
CO2: 28 mEq/L (ref 19–32)
CREATININE: 0.93 mg/dL (ref 0.40–1.50)
Chloride: 105 mEq/L (ref 96–112)
GFR: 103.64 mL/min (ref >60.00)
Glucose, Bld: 178 mg/dL — ABNORMAL HIGH (ref 70–99)
POTASSIUM: 4 meq/L (ref 3.5–5.1)
Sodium: 141 mEq/L (ref 135–145)

## 2016-05-01 NOTE — Assessment & Plan Note (Signed)
Cont w/Losartan, Coreg 

## 2016-05-01 NOTE — Assessment & Plan Note (Signed)
Invokana samples

## 2016-05-01 NOTE — Progress Notes (Signed)
Pre-visit discussion using our clinic review tool. No additional management support is needed unless otherwise documented below in the visit note.  

## 2016-05-01 NOTE — Assessment & Plan Note (Signed)
Lipitor ASA 

## 2016-05-01 NOTE — Progress Notes (Signed)
Subjective:  Patient ID: Clinton Kirby, male    DOB: 01/19/1946  Age: 71 y.o. MRN: [id number]  CC: Hypertension and Diabetes   HPI Clinton Kirby presents for DM, HTN, dyslipidemia f/u. C/o Janumet - and invokana - $$$  Outpatient Medications Prior to Visit  Medication Sig Dispense Refill  . atorvastatin (LIPITOR) 40 MG tablet TAKE ONE TABLET BY MOUTH ONCE DAILY 90 tablet 1  . carvedilol (COREG) 12.5 MG tablet Take 1 tablet (12.5 mg total) by mouth 2 (two) times daily. 180 tablet 3  . clotrimazole-betamethasone (LOTRISONE) cream APPLY  CREAM TOPICALLY TO AFFECTED AREA TWICE DAILY 45 g 1  . glucose blood (ONETOUCH VERIO) test strip 1 each by Other route 2 (two) times daily. Use to check blood sugars twice a day Dx E11.9 100 each 3  . INVOKANA 300 MG TABS tablet Take 1 tablet (300 mg total) by mouth daily. 30 tablet 3  . losartan (COZAAR) 50 MG tablet Take 1 tablet (50 mg total) by mouth daily. 90 tablet 3  . ONETOUCH DELICA LANCETS FINE MISC Use to help check blood sugars daily Dx E11.9 100 each 3  . SitaGLIPtin-MetFORMIN HCl (JANUMET XR) [phone] MG TB24 1 po qd 30 tablet 11  . metFORMIN (GLUCOPHAGE) 1000 MG tablet Take 1,000 mg by mouth daily with breakfast.     Facility-Administered Medications Prior to Visit  Medication Dose Route Frequency Provider Last Rate Last Dose  . aspirin chewable tablet 81 mg  81 mg Oral Once Peter M Martinique, MD        ROS Review of Systems  Constitutional: Negative for appetite change, fatigue and unexpected weight change.  HENT: Negative for congestion, nosebleeds, sneezing, sore throat and trouble swallowing.   Eyes: Negative for itching and visual disturbance.  Respiratory: Negative for cough.   Cardiovascular: Negative for chest pain, palpitations and leg swelling.  Gastrointestinal: Negative for abdominal distention, blood in stool, diarrhea and nausea.  Genitourinary: Negative for frequency and hematuria.  Musculoskeletal: Positive for  arthralgias. Negative for back pain, gait problem, joint swelling and neck pain.  Skin: Negative for rash.  Neurological: Negative for dizziness, tremors, speech difficulty and weakness.  Psychiatric/Behavioral: Negative for agitation, dysphoric mood and sleep disturbance. The patient is not nervous/anxious.     Objective:  BP 126/78   Pulse 81   Temp 98.2 F (36.8 C) (Oral)   Resp 16   Ht 6' (1.829 m)   Wt 230 lb (104.3 kg)   SpO2 95%   BMI 31.19 kg/m   BP Readings from Last 3 Encounters:  05/01/16 126/78  03/13/16 140/84  03/03/16 137/77    Wt Readings from Last 3 Encounters:  05/01/16 230 lb (104.3 kg)  03/13/16 227 lb 3.2 oz (103.1 kg)  03/03/16 224 lb (101.6 kg)    Physical Exam  Constitutional: He is oriented to person, place, and time. He appears well-developed. No distress.  NAD  HENT:  Mouth/Throat: Oropharynx is clear and moist.  Eyes: Conjunctivae are normal. Pupils are equal, round, and reactive to light.  Neck: Normal range of motion. No JVD present. No thyromegaly present.  Cardiovascular: Normal rate, regular rhythm, normal heart sounds and intact distal pulses.  Exam reveals no gallop and no friction rub.   No murmur heard. Pulmonary/Chest: Effort normal and breath sounds normal. No respiratory distress. He has no wheezes. He has no rales. He exhibits no tenderness.  Abdominal: Soft. Bowel sounds are normal. He exhibits no distension and no mass. There  is no tenderness. There is no rebound and no guarding.  Musculoskeletal: Normal range of motion. He exhibits no edema or tenderness.  Lymphadenopathy:    He has no cervical adenopathy.  Neurological: He is alert and oriented to person, place, and time. He has normal reflexes. No cranial nerve deficit. He exhibits normal muscle tone. He displays a negative Romberg sign. Coordination and gait normal.  Skin: Skin is warm and dry. No rash noted.  Psychiatric: He has a normal mood and affect. His behavior is  normal. Judgment and thought content normal.    Lab Results  Component Value Date   WBC 7.6 11/09/2014   HGB 16.7 11/09/2014   HCT 49.5 11/09/2014   PLT 303.0 11/09/2014   GLUCOSE 117 (H) 01/31/2016   CHOL 129 01/31/2016   TRIG 167.0 (H) 01/31/2016   HDL 47.20 01/31/2016   LDLDIRECT 136.0 11/09/2014   LDLCALC 49 01/31/2016   ALT 23 01/31/2016   AST 17 01/31/2016   NA 140 01/31/2016   K 4.4 01/31/2016   CL 102 01/31/2016   CREATININE 1.00 01/31/2016   BUN 17 01/31/2016   CO2 30 01/31/2016   TSH 0.72 11/09/2014   PSA 41.85 (H) 06/25/2015   HGBA1C 7.2 (H) 01/31/2016   MICROALBUR 1.0 01/08/2014    No results found.  Assessment & Plan:   There are no diagnoses linked to this encounter. I have discontinued Mr. Guyton metFORMIN. I am also having him maintain his atorvastatin, glucose blood, ONETOUCH DELICA LANCETS FINE, clotrimazole-betamethasone, carvedilol, SitaGLIPtin-MetFORMIN HCl, losartan, INVOKANA, and Vitamin D3. We will continue to administer aspirin.  Meds ordered this encounter  Medications  . Cholecalciferol (VITAMIN D3) 1000 units CAPS    Sig: Take 1 capsule by mouth.     Follow-up: No Follow-up on file.  Walker Kehr, MD

## 2016-05-01 NOTE — Assessment & Plan Note (Signed)
f/u Urology - on Lupron, XRT

## 2016-05-01 NOTE — Assessment & Plan Note (Signed)
Invokana, Janumet XR Lipitor, ASA

## 2016-05-07 ENCOUNTER — Other Ambulatory Visit: Payer: Self-pay

## 2016-05-07 ENCOUNTER — Ambulatory Visit (HOSPITAL_COMMUNITY): Payer: Medicare Other | Attending: Cardiovascular Disease

## 2016-05-07 DIAGNOSIS — I371 Nonrheumatic pulmonary valve insufficiency: Secondary | ICD-10-CM | POA: Diagnosis not present

## 2016-05-07 DIAGNOSIS — E119 Type 2 diabetes mellitus without complications: Secondary | ICD-10-CM | POA: Insufficient documentation

## 2016-05-07 DIAGNOSIS — I255 Ischemic cardiomyopathy: Secondary | ICD-10-CM | POA: Diagnosis not present

## 2016-05-07 DIAGNOSIS — I071 Rheumatic tricuspid insufficiency: Secondary | ICD-10-CM | POA: Insufficient documentation

## 2016-05-07 DIAGNOSIS — I25119 Atherosclerotic heart disease of native coronary artery with unspecified angina pectoris: Secondary | ICD-10-CM | POA: Diagnosis not present

## 2016-05-07 DIAGNOSIS — I340 Nonrheumatic mitral (valve) insufficiency: Secondary | ICD-10-CM | POA: Insufficient documentation

## 2016-05-07 DIAGNOSIS — I252 Old myocardial infarction: Secondary | ICD-10-CM | POA: Diagnosis not present

## 2016-05-07 DIAGNOSIS — I2510 Atherosclerotic heart disease of native coronary artery without angina pectoris: Secondary | ICD-10-CM | POA: Diagnosis not present

## 2016-05-07 DIAGNOSIS — I10 Essential (primary) hypertension: Secondary | ICD-10-CM | POA: Diagnosis not present

## 2016-05-07 DIAGNOSIS — Z9861 Coronary angioplasty status: Secondary | ICD-10-CM | POA: Diagnosis not present

## 2016-05-07 DIAGNOSIS — E7800 Pure hypercholesterolemia, unspecified: Secondary | ICD-10-CM | POA: Diagnosis not present

## 2016-05-07 MED ORDER — PERFLUTREN LIPID MICROSPHERE
1.0000 mL | INTRAVENOUS | Status: AC | PRN
  Administered 2016-05-07: 2 mL via INTRAVENOUS

## 2016-05-08 ENCOUNTER — Other Ambulatory Visit: Payer: Self-pay | Admitting: Pharmacy Technician

## 2016-05-11 NOTE — Patient Outreach (Signed)
Patient has questions about Invokana side effects. Patient also co-pays expensive co-pays on multiple drugs making it hard for him to receive them on a regular basis. I am referring him to a Pharmacist for consultation on Invokana and well as applying for Patient Assistance.

## 2016-05-18 ENCOUNTER — Other Ambulatory Visit: Payer: Self-pay | Admitting: Pharmacist

## 2016-05-18 NOTE — Patient Outreach (Signed)
Clinton Kirby was referred to pharmacist to address his questions about Invokana and to discuss medication assistance options. Call and speak with [name]. Patient declines to verify identity. Patient denies any current medication questions or medication assistance needs. Patient states that he will follow up with his PCP regarding any future medication questions or assistance needs.  Will close pharmacy episode at this time.  Harlow Asa, PharmD, [facility] [phone]

## 2016-05-26 DIAGNOSIS — R200 Anesthesia of skin: Secondary | ICD-10-CM | POA: Diagnosis not present

## 2016-05-26 DIAGNOSIS — E1140 Type 2 diabetes mellitus with diabetic neuropathy, unspecified: Secondary | ICD-10-CM | POA: Diagnosis not present

## 2016-05-26 DIAGNOSIS — R202 Paresthesia of skin: Secondary | ICD-10-CM | POA: Diagnosis not present

## 2016-05-26 DIAGNOSIS — M7742 Metatarsalgia, left foot: Secondary | ICD-10-CM | POA: Diagnosis not present

## 2016-05-28 DIAGNOSIS — C61 Malignant neoplasm of prostate: Secondary | ICD-10-CM | POA: Diagnosis not present

## 2016-06-03 NOTE — Progress Notes (Signed)
  Radiation Oncology         [phone] ________________________________  Name: Clinton Kirby MRN: [id number]  Date: 06/04/2016  DOB: May 31, 1945  SIMULATION AND TREATMENT PLANNING NOTE    ICD-9-CM ICD-10-CM   1. Prostate cancer ([place]) 185 C61     DIAGNOSIS:  71 y.o. gentleman with stage T1c adenocarcinoma of the prostate with a Gleason's score of 4+5 and a PSA of 41.53  NARRATIVE:  The patient was brought to the [facility].  Identity was confirmed.  All relevant records and images related to the planned course of therapy were reviewed.  The patient freely provided informed written consent to proceed with treatment after reviewing the details related to the planned course of therapy. The consent form was witnessed and verified by the simulation staff.  Then, the patient was set-up in a stable reproducible supine position for radiation therapy.  A vacuum lock pillow device was custom fabricated to position his legs in a reproducible immobilized position.  Then, I performed a urethrogram under sterile conditions to identify the prostatic apex.  CT images were obtained.  Surface markings were placed.  The CT images were loaded into the planning software.  Then the prostate target and avoidance structures including the rectum, bladder, bowel and hips were contoured.  Treatment planning then occurred.  The radiation prescription was entered and confirmed.  A total of 1 complex treatment devices were fabricated. I have requested : Intensity Modulated Radiotherapy (IMRT) is medically necessary for this case for the following reason:  Rectal sparing.[name]  PLAN:  The patient will receive 75 Gy in 40 fractions with pelvis to 45 Gy then boost.  ________________________________  Sheral Apley. Tammi Klippel, M.D.\

## 2016-06-04 ENCOUNTER — Encounter: Payer: Self-pay | Admitting: Medical Oncology

## 2016-06-04 ENCOUNTER — Ambulatory Visit
Admission: RE | Admit: 2016-06-04 | Discharge: 2016-06-04 | Disposition: A | Discharge status: Home / Self Care | Payer: Medicare Other | Source: Ambulatory Visit | Attending: Radiation Oncology | Admitting: Radiation Oncology

## 2016-06-04 DIAGNOSIS — C61 Malignant neoplasm of prostate: Secondary | ICD-10-CM | POA: Insufficient documentation

## 2016-06-04 DIAGNOSIS — Z510 Encounter for antineoplastic radiation therapy: Secondary | ICD-10-CM | POA: Diagnosis not present

## 2016-06-09 LAB — HM DIABETES EYE EXAM

## 2016-06-11 DIAGNOSIS — Z510 Encounter for antineoplastic radiation therapy: Secondary | ICD-10-CM | POA: Diagnosis not present

## 2016-06-11 DIAGNOSIS — C61 Malignant neoplasm of prostate: Secondary | ICD-10-CM | POA: Diagnosis not present

## 2016-06-15 ENCOUNTER — Ambulatory Visit
Admission: RE | Admit: 2016-06-15 | Discharge: 2016-06-15 | Disposition: A | Discharge status: Home / Self Care | Payer: Medicare Other | Source: Ambulatory Visit | Attending: Radiation Oncology | Admitting: Radiation Oncology

## 2016-06-15 DIAGNOSIS — Z510 Encounter for antineoplastic radiation therapy: Secondary | ICD-10-CM | POA: Diagnosis not present

## 2016-06-15 DIAGNOSIS — C61 Malignant neoplasm of prostate: Secondary | ICD-10-CM | POA: Diagnosis not present

## 2016-06-16 ENCOUNTER — Ambulatory Visit
Admission: RE | Admit: 2016-06-16 | Discharge: 2016-06-16 | Disposition: A | Discharge status: Home / Self Care | Payer: Medicare Other | Source: Ambulatory Visit | Attending: Radiation Oncology | Admitting: Radiation Oncology

## 2016-06-16 DIAGNOSIS — C61 Malignant neoplasm of prostate: Secondary | ICD-10-CM | POA: Diagnosis not present

## 2016-06-16 DIAGNOSIS — Z510 Encounter for antineoplastic radiation therapy: Secondary | ICD-10-CM | POA: Diagnosis not present

## 2016-06-17 ENCOUNTER — Ambulatory Visit
Admission: RE | Admit: 2016-06-17 | Discharge: 2016-06-17 | Disposition: A | Discharge status: Home / Self Care | Payer: Medicare Other | Source: Ambulatory Visit | Attending: Radiation Oncology | Admitting: Radiation Oncology

## 2016-06-17 DIAGNOSIS — C61 Malignant neoplasm of prostate: Secondary | ICD-10-CM | POA: Diagnosis not present

## 2016-06-17 DIAGNOSIS — Z510 Encounter for antineoplastic radiation therapy: Secondary | ICD-10-CM | POA: Diagnosis not present

## 2016-06-18 ENCOUNTER — Ambulatory Visit
Admission: RE | Admit: 2016-06-18 | Discharge: 2016-06-18 | Disposition: A | Discharge status: Home / Self Care | Payer: Medicare Other | Source: Ambulatory Visit | Attending: Radiation Oncology | Admitting: Radiation Oncology

## 2016-06-18 DIAGNOSIS — C61 Malignant neoplasm of prostate: Secondary | ICD-10-CM

## 2016-06-18 DIAGNOSIS — Z510 Encounter for antineoplastic radiation therapy: Secondary | ICD-10-CM | POA: Diagnosis not present

## 2016-06-18 NOTE — Progress Notes (Signed)
Pt here for patient teaching.  Pt given Radiation and You booklet and skin care instructions.  Reviewed areas of pertinence such as diarrhea, fatigue, hair loss, sexual and fertility changes, skin changes and urinary and bladder changes . Pt able to give teach back of to pat skin, use unscented/gentle soap, use baby wipes, have Imodium on hand, drink plenty of water and sitz bath,avoid applying anything to skin within 4 hours of treatment. Pt demonstrated understanding and verbalizes understanding of information given and will contact nursing with any questions or concerns.

## 2016-06-19 ENCOUNTER — Ambulatory Visit
Admission: RE | Admit: 2016-06-19 | Discharge: 2016-06-19 | Disposition: A | Discharge status: Home / Self Care | Payer: Medicare Other | Source: Ambulatory Visit | Attending: Radiation Oncology | Admitting: Radiation Oncology

## 2016-06-19 DIAGNOSIS — C61 Malignant neoplasm of prostate: Secondary | ICD-10-CM | POA: Diagnosis not present

## 2016-06-19 DIAGNOSIS — Z510 Encounter for antineoplastic radiation therapy: Secondary | ICD-10-CM | POA: Diagnosis not present

## 2016-06-22 ENCOUNTER — Ambulatory Visit
Admission: RE | Admit: 2016-06-22 | Discharge: 2016-06-22 | Disposition: A | Discharge status: Home / Self Care | Payer: Medicare Other | Source: Ambulatory Visit | Attending: Radiation Oncology | Admitting: Radiation Oncology

## 2016-06-22 DIAGNOSIS — Z510 Encounter for antineoplastic radiation therapy: Secondary | ICD-10-CM | POA: Diagnosis not present

## 2016-06-22 DIAGNOSIS — C61 Malignant neoplasm of prostate: Secondary | ICD-10-CM | POA: Diagnosis not present

## 2016-06-23 ENCOUNTER — Ambulatory Visit
Admission: RE | Admit: 2016-06-23 | Discharge: 2016-06-23 | Disposition: A | Discharge status: Home / Self Care | Payer: Medicare Other | Source: Ambulatory Visit | Attending: Radiation Oncology | Admitting: Radiation Oncology

## 2016-06-23 DIAGNOSIS — Z510 Encounter for antineoplastic radiation therapy: Secondary | ICD-10-CM | POA: Diagnosis not present

## 2016-06-23 DIAGNOSIS — C61 Malignant neoplasm of prostate: Secondary | ICD-10-CM | POA: Diagnosis not present

## 2016-06-23 DIAGNOSIS — R202 Paresthesia of skin: Secondary | ICD-10-CM | POA: Diagnosis not present

## 2016-06-23 DIAGNOSIS — R200 Anesthesia of skin: Secondary | ICD-10-CM | POA: Diagnosis not present

## 2016-06-23 DIAGNOSIS — E1140 Type 2 diabetes mellitus with diabetic neuropathy, unspecified: Secondary | ICD-10-CM | POA: Diagnosis not present

## 2016-06-24 ENCOUNTER — Ambulatory Visit
Admission: RE | Admit: 2016-06-24 | Discharge: 2016-06-24 | Disposition: A | Discharge status: Home / Self Care | Payer: Medicare Other | Source: Ambulatory Visit | Attending: Radiation Oncology | Admitting: Radiation Oncology

## 2016-06-24 DIAGNOSIS — C61 Malignant neoplasm of prostate: Secondary | ICD-10-CM | POA: Diagnosis not present

## 2016-06-24 DIAGNOSIS — Z510 Encounter for antineoplastic radiation therapy: Secondary | ICD-10-CM | POA: Diagnosis not present

## 2016-06-25 ENCOUNTER — Ambulatory Visit
Admission: RE | Admit: 2016-06-25 | Discharge: 2016-06-25 | Disposition: A | Discharge status: Home / Self Care | Payer: Medicare Other | Source: Ambulatory Visit | Attending: Radiation Oncology | Admitting: Radiation Oncology

## 2016-06-25 DIAGNOSIS — Z510 Encounter for antineoplastic radiation therapy: Secondary | ICD-10-CM | POA: Diagnosis not present

## 2016-06-25 DIAGNOSIS — C61 Malignant neoplasm of prostate: Secondary | ICD-10-CM | POA: Diagnosis not present

## 2016-06-26 ENCOUNTER — Ambulatory Visit
Admission: RE | Admit: 2016-06-26 | Discharge: 2016-06-26 | Disposition: A | Discharge status: Home / Self Care | Payer: Medicare Other | Source: Ambulatory Visit | Attending: Radiation Oncology | Admitting: Radiation Oncology

## 2016-06-26 DIAGNOSIS — C61 Malignant neoplasm of prostate: Secondary | ICD-10-CM | POA: Diagnosis not present

## 2016-06-26 DIAGNOSIS — Z510 Encounter for antineoplastic radiation therapy: Secondary | ICD-10-CM | POA: Diagnosis not present

## 2016-06-27 DIAGNOSIS — E119 Type 2 diabetes mellitus without complications: Secondary | ICD-10-CM | POA: Diagnosis not present

## 2016-06-27 DIAGNOSIS — L03032 Cellulitis of left toe: Secondary | ICD-10-CM | POA: Diagnosis not present

## 2016-06-29 ENCOUNTER — Ambulatory Visit
Admission: RE | Admit: 2016-06-29 | Discharge: 2016-06-29 | Disposition: A | Discharge status: Home / Self Care | Payer: Medicare Other | Source: Ambulatory Visit | Attending: Radiation Oncology | Admitting: Radiation Oncology

## 2016-06-29 DIAGNOSIS — C61 Malignant neoplasm of prostate: Secondary | ICD-10-CM | POA: Diagnosis not present

## 2016-06-29 DIAGNOSIS — Z510 Encounter for antineoplastic radiation therapy: Secondary | ICD-10-CM | POA: Diagnosis not present

## 2016-06-30 ENCOUNTER — Ambulatory Visit
Admission: RE | Admit: 2016-06-30 | Discharge: 2016-06-30 | Disposition: A | Discharge status: Home / Self Care | Payer: Medicare Other | Source: Ambulatory Visit | Attending: Radiation Oncology | Admitting: Radiation Oncology

## 2016-06-30 DIAGNOSIS — Z510 Encounter for antineoplastic radiation therapy: Secondary | ICD-10-CM | POA: Diagnosis not present

## 2016-06-30 DIAGNOSIS — C61 Malignant neoplasm of prostate: Secondary | ICD-10-CM | POA: Diagnosis not present

## 2016-07-01 ENCOUNTER — Ambulatory Visit
Admission: RE | Admit: 2016-07-01 | Discharge: 2016-07-01 | Disposition: A | Discharge status: Home / Self Care | Payer: Medicare Other | Source: Ambulatory Visit | Attending: Radiation Oncology | Admitting: Radiation Oncology

## 2016-07-01 DIAGNOSIS — G629 Polyneuropathy, unspecified: Secondary | ICD-10-CM | POA: Diagnosis not present

## 2016-07-01 DIAGNOSIS — C61 Malignant neoplasm of prostate: Secondary | ICD-10-CM | POA: Diagnosis not present

## 2016-07-01 DIAGNOSIS — Z510 Encounter for antineoplastic radiation therapy: Secondary | ICD-10-CM | POA: Diagnosis not present

## 2016-07-02 ENCOUNTER — Ambulatory Visit
Admission: RE | Admit: 2016-07-02 | Discharge: 2016-07-02 | Disposition: A | Discharge status: Home / Self Care | Payer: Medicare Other | Source: Ambulatory Visit | Attending: Radiation Oncology | Admitting: Radiation Oncology

## 2016-07-02 DIAGNOSIS — Z510 Encounter for antineoplastic radiation therapy: Secondary | ICD-10-CM | POA: Diagnosis not present

## 2016-07-02 DIAGNOSIS — C61 Malignant neoplasm of prostate: Secondary | ICD-10-CM | POA: Diagnosis not present

## 2016-07-03 ENCOUNTER — Ambulatory Visit
Admission: RE | Admit: 2016-07-03 | Discharge: 2016-07-03 | Disposition: A | Discharge status: Home / Self Care | Payer: Medicare Other | Source: Ambulatory Visit | Attending: Radiation Oncology | Admitting: Radiation Oncology

## 2016-07-03 ENCOUNTER — Other Ambulatory Visit: Payer: Self-pay | Admitting: Radiation Oncology

## 2016-07-03 DIAGNOSIS — C61 Malignant neoplasm of prostate: Secondary | ICD-10-CM

## 2016-07-03 DIAGNOSIS — Z510 Encounter for antineoplastic radiation therapy: Secondary | ICD-10-CM | POA: Diagnosis not present

## 2016-07-03 MED ORDER — TAMSULOSIN HCL 0.4 MG PO CAPS: 0.4000 mg | ORAL_CAPSULE | Freq: Every day | ORAL | 5 refills | Status: DC

## 2016-07-07 ENCOUNTER — Ambulatory Visit: Admission: RE | Admit: 2016-07-07 | Payer: Medicare Other | Source: Ambulatory Visit

## 2016-07-07 ENCOUNTER — Ambulatory Visit: Payer: Medicare Other

## 2016-07-07 ENCOUNTER — Encounter: Payer: Self-pay | Admitting: Internal Medicine

## 2016-07-07 DIAGNOSIS — L600 Ingrowing nail: Secondary | ICD-10-CM | POA: Diagnosis not present

## 2016-07-07 DIAGNOSIS — M79674 Pain in right toe(s): Secondary | ICD-10-CM | POA: Diagnosis not present

## 2016-07-07 DIAGNOSIS — M79675 Pain in left toe(s): Secondary | ICD-10-CM | POA: Diagnosis not present

## 2016-07-07 DIAGNOSIS — B351 Tinea unguium: Secondary | ICD-10-CM | POA: Diagnosis not present

## 2016-07-07 NOTE — Progress Notes (Unsigned)
Results entered and sent to scan  

## 2016-07-08 ENCOUNTER — Ambulatory Visit
Admission: RE | Admit: 2016-07-08 | Discharge: 2016-07-08 | Disposition: A | Discharge status: Home / Self Care | Payer: Medicare Other | Source: Ambulatory Visit | Attending: Radiation Oncology | Admitting: Radiation Oncology

## 2016-07-08 DIAGNOSIS — Z510 Encounter for antineoplastic radiation therapy: Secondary | ICD-10-CM | POA: Diagnosis not present

## 2016-07-08 DIAGNOSIS — C61 Malignant neoplasm of prostate: Secondary | ICD-10-CM | POA: Diagnosis not present

## 2016-07-09 ENCOUNTER — Ambulatory Visit
Admission: RE | Admit: 2016-07-09 | Discharge: 2016-07-09 | Disposition: A | Discharge status: Home / Self Care | Payer: Medicare Other | Source: Ambulatory Visit | Attending: Radiation Oncology | Admitting: Radiation Oncology

## 2016-07-09 ENCOUNTER — Telehealth: Payer: Self-pay | Admitting: Internal Medicine

## 2016-07-09 DIAGNOSIS — Z510 Encounter for antineoplastic radiation therapy: Secondary | ICD-10-CM | POA: Diagnosis not present

## 2016-07-09 DIAGNOSIS — C61 Malignant neoplasm of prostate: Secondary | ICD-10-CM | POA: Diagnosis not present

## 2016-07-09 NOTE — Telephone Encounter (Signed)
Please advise 

## 2016-07-09 NOTE — Telephone Encounter (Signed)
Pt would like to know if he can get a referral to a dermatologists. He is breaking out on his elbows and under his arms and his oncologists told him it is not coming from his radiation. Please advise.

## 2016-07-09 NOTE — Telephone Encounter (Signed)
Pls see me or another provider first Thx

## 2016-07-10 ENCOUNTER — Ambulatory Visit
Admission: RE | Admit: 2016-07-10 | Discharge: 2016-07-10 | Disposition: A | Discharge status: Home / Self Care | Payer: Medicare Other | Source: Ambulatory Visit | Attending: Radiation Oncology | Admitting: Radiation Oncology

## 2016-07-10 ENCOUNTER — Encounter: Payer: Self-pay | Admitting: Medical Oncology

## 2016-07-10 DIAGNOSIS — Z510 Encounter for antineoplastic radiation therapy: Secondary | ICD-10-CM | POA: Diagnosis not present

## 2016-07-10 DIAGNOSIS — C61 Malignant neoplasm of prostate: Secondary | ICD-10-CM | POA: Diagnosis not present

## 2016-07-10 NOTE — Progress Notes (Signed)
Mr. Stillings states that radiation is going well with minimal side effects.

## 2016-07-13 ENCOUNTER — Ambulatory Visit (INDEPENDENT_AMBULATORY_CARE_PROVIDER_SITE_OTHER): Payer: Medicare Other | Admitting: Nurse Practitioner

## 2016-07-13 ENCOUNTER — Encounter: Payer: Self-pay | Admitting: Nurse Practitioner

## 2016-07-13 ENCOUNTER — Ambulatory Visit
Admission: RE | Admit: 2016-07-13 | Discharge: 2016-07-13 | Disposition: A | Discharge status: Home / Self Care | Payer: Medicare Other | Source: Ambulatory Visit | Attending: Radiation Oncology | Admitting: Radiation Oncology

## 2016-07-13 VITALS — BP 112/76 | HR 81 | Temp 97.6°F | Ht 72.0 in | Wt 235.0 lb

## 2016-07-13 DIAGNOSIS — Z510 Encounter for antineoplastic radiation therapy: Secondary | ICD-10-CM | POA: Diagnosis not present

## 2016-07-13 DIAGNOSIS — L304 Erythema intertrigo: Secondary | ICD-10-CM | POA: Diagnosis not present

## 2016-07-13 DIAGNOSIS — C61 Malignant neoplasm of prostate: Secondary | ICD-10-CM | POA: Diagnosis not present

## 2016-07-13 MED ORDER — CLOTRIMAZOLE-BETAMETHASONE 1-0.05 % EX CREA: TOPICAL_CREAM | Freq: Two times a day (BID) | CUTANEOUS | 1 refills | Status: DC

## 2016-07-13 MED ORDER — FLUCONAZOLE 200 MG PO TABS: 200.0000 mg | ORAL_TABLET | Freq: Once | ORAL | 0 refills | Status: AC

## 2016-07-13 NOTE — Telephone Encounter (Signed)
Please schedule patient an appointment  Thank you

## 2016-07-13 NOTE — Patient Instructions (Addendum)
Use cream on axillary region for 1-2 weeks, then switch to OTC powder (ZEAsorb)  Intertrigo Intertrigo is skin irritation (inflammation) that happens in warm, moist areas of the body. The irritation can cause a rash and make skin raw and itchy. The rash is usually pink or red. It happens mostly between folds of skin or where skin rubs together, such as:  Toes.  Armpits.  Groin.  Belly.  Breasts.  Buttocks.  This condition is not passed from person to person (is not contagious). Follow these instructions at home:  Keep the affected area clean and dry.  Do not scratch your skin.  Stay cool as much as possible. Use an air conditioner or fan, if you can.  Apply over-the-counter and prescription medicines only as told by your doctor.  If you were prescribed an antibiotic medicine, use it as told by your doctor. Do not stop using the antibiotic even if your condition starts to get better.  Keep all follow-up visits as told by your doctor. This is important. How is this prevented?  Stay at a healthy weight.  Keep your feet dry. This is very important if you have diabetes. Wear cotton or wool socks.  Take care of and protect the skin in your groin and butt area as told by your doctor.  Do not wear tight clothes. Wear clothes that: ? Are loose. ? Take away moisture from your body. ? Are made of cotton.  Wear a bra that gives good support, if needed.  Shower and dry yourself fully after being active.  Keep your blood sugar under control if you have diabetes. Contact a doctor if:  Your symptoms do not get better with treatment.  Your symptoms get worse or they spread.  You notice more redness and warmth.  You have a fever. This information is not intended to replace advice given to you by your health care provider. Make sure you discuss any questions you have with your health care provider. Document Released: 02/28/2010 Document Revised: 07/04/2015 Document Reviewed:  07/30/2014 Elsevier Interactive Patient Education  [employer].

## 2016-07-13 NOTE — Progress Notes (Signed)
Subjective:  Patient ID: Clinton Kirby, male    DOB: [birth year]  Age: 71 y.o. MRN: [id number]  CC: Rash (rash on arms for 1 mo. )   Rash  This is a new problem. The current episode started 1 to 4 weeks ago. The problem is unchanged. The affected locations include the left axilla and right axilla (bilateral antecubital). The rash is characterized by itchiness and scaling. He was exposed to nothing. Pertinent negatives include no facial edema or joint pain. Past treatments include antibiotic cream. The treatment provided no relief.    Outpatient Medications Prior to Visit  Medication Sig Dispense Refill  . atorvastatin (LIPITOR) 40 MG tablet TAKE ONE TABLET BY MOUTH ONCE DAILY 90 tablet 1  . Cholecalciferol (VITAMIN D3) 1000 units CAPS Take 1 capsule by mouth.    [name] glucose blood (ONETOUCH VERIO) test strip 1 each by Other route 2 (two) times daily. Use to check blood sugars twice a day Dx E11.9 100 each 3  . INVOKANA 300 MG TABS tablet Take 1 tablet (300 mg total) by mouth daily. 30 tablet 3  . losartan (COZAAR) 50 MG tablet Take 1 tablet (50 mg total) by mouth daily. 90 tablet 3  . ONETOUCH DELICA LANCETS FINE MISC Use to help check blood sugars daily Dx E11.9 100 each 3  . SitaGLIPtin-MetFORMIN HCl (JANUMET XR) [phone] MG TB24 1 po qd 30 tablet 11  . clotrimazole-betamethasone (LOTRISONE) cream APPLY  CREAM TOPICALLY TO AFFECTED AREA TWICE DAILY 45 g 1  . carvedilol (COREG) 12.5 MG tablet Take 1 tablet (12.5 mg total) by mouth 2 (two) times daily. 180 tablet 3  . tamsulosin (FLOMAX) 0.4 MG CAPS capsule Take 1 capsule (0.4 mg total) by mouth daily after supper. (Patient not taking: Reported on 07/13/2016) 30 capsule 5   Facility-Administered Medications Prior to Visit  Medication Dose Route Frequency Provider Last Rate Last Dose  . aspirin chewable tablet 81 mg  81 mg Oral Once Martinique, Peter M, MD        ROS See HPI  Objective:  BP 112/76   Pulse 81   Temp 97.6 F (36.4 C)    Ht 6' (1.829 m)   Wt 235 lb (106.6 kg)   SpO2 99%   BMI 31.87 kg/m   BP Readings from Last 3 Encounters:  07/13/16 112/76  05/01/16 126/78  03/13/16 140/84    Wt Readings from Last 3 Encounters:  07/13/16 235 lb (106.6 kg)  05/01/16 230 lb (104.3 kg)  03/13/16 227 lb 3.2 oz (103.1 kg)    Physical Exam  Constitutional: He is oriented to person, place, and time.  Cardiovascular: Normal rate.   Pulmonary/Chest: Effort normal.  Neurological: He is alert and oriented to person, place, and time.  Skin: Skin is warm and dry. Lesion and rash noted.     Vitals reviewed.   Lab Results  Component Value Date   WBC 7.6 11/09/2014   HGB 16.7 11/09/2014   HCT 49.5 11/09/2014   PLT 303.0 11/09/2014   GLUCOSE 178 (H) 05/01/2016   CHOL 129 01/31/2016   TRIG 167.0 (H) 01/31/2016   HDL 47.20 01/31/2016   LDLDIRECT 136.0 11/09/2014   LDLCALC 49 01/31/2016   ALT 23 01/31/2016   AST 17 01/31/2016   NA 141 05/01/2016   K 4.0 05/01/2016   CL 105 05/01/2016   CREATININE 0.93 05/01/2016   BUN 18 05/01/2016   CO2 28 05/01/2016   TSH 0.72 11/09/2014   PSA 41.85 (  H) 06/25/2015   HGBA1C 8.3 (H) 05/01/2016   MICROALBUR 1.0 01/08/2014    No results found.  Assessment & Plan:   Amadou was seen today for rash.  Diagnoses and all orders for this visit:  Intertrigo -     clotrimazole-betamethasone (LOTRISONE) cream; Apply topically 2 (two) times daily. -     fluconazole (DIFLUCAN) 200 MG tablet; Take 1 tablet (200 mg total) by mouth once. -     Ambulatory referral to Dermatology   I have changed Mr. Wrightsman clotrimazole-betamethasone. I am also having him start on fluconazole. Additionally, I am having him maintain his atorvastatin, glucose blood, ONETOUCH DELICA LANCETS FINE, carvedilol, SitaGLIPtin-MetFORMIN HCl, losartan, INVOKANA, Vitamin D3, tamsulosin, and pregabalin. We will continue to administer aspirin.  Meds ordered this encounter  Medications  . pregabalin (LYRICA)  50 MG capsule    Sig: Take 50 mg by mouth 3 (three) times daily. Pt report taking twice daily  . clotrimazole-betamethasone (LOTRISONE) cream    Sig: Apply topically 2 (two) times daily.    Dispense:  45 g    Refill:  1    Order Specific Question:   Supervising Provider    Answer:   [name] [[id number]]  . fluconazole (DIFLUCAN) 200 MG tablet    Sig: Take 1 tablet (200 mg total) by mouth once.    Dispense:  1 tablet    Refill:  0    Order Specific Question:   Supervising Provider    Answer:   [name] [[id number]]    Follow-up: Return if symptoms worsen or fail to improve.  Wilfred Lacy, NP

## 2016-07-14 ENCOUNTER — Ambulatory Visit
Admission: RE | Admit: 2016-07-14 | Discharge: 2016-07-14 | Disposition: A | Discharge status: Home / Self Care | Payer: Medicare Other | Source: Ambulatory Visit | Attending: Radiation Oncology | Admitting: Radiation Oncology

## 2016-07-14 DIAGNOSIS — C61 Malignant neoplasm of prostate: Secondary | ICD-10-CM | POA: Diagnosis not present

## 2016-07-14 DIAGNOSIS — Z510 Encounter for antineoplastic radiation therapy: Secondary | ICD-10-CM | POA: Diagnosis not present

## 2016-07-15 ENCOUNTER — Ambulatory Visit
Admission: RE | Admit: 2016-07-15 | Discharge: 2016-07-15 | Disposition: A | Discharge status: Home / Self Care | Payer: Medicare Other | Source: Ambulatory Visit | Attending: Radiation Oncology | Admitting: Radiation Oncology

## 2016-07-15 DIAGNOSIS — C61 Malignant neoplasm of prostate: Secondary | ICD-10-CM | POA: Diagnosis not present

## 2016-07-15 DIAGNOSIS — Z510 Encounter for antineoplastic radiation therapy: Secondary | ICD-10-CM | POA: Diagnosis not present

## 2016-07-16 ENCOUNTER — Ambulatory Visit
Admission: RE | Admit: 2016-07-16 | Discharge: 2016-07-16 | Disposition: A | Discharge status: Home / Self Care | Payer: Medicare Other | Source: Ambulatory Visit | Attending: Radiation Oncology | Admitting: Radiation Oncology

## 2016-07-16 DIAGNOSIS — C61 Malignant neoplasm of prostate: Secondary | ICD-10-CM | POA: Diagnosis not present

## 2016-07-16 DIAGNOSIS — Z510 Encounter for antineoplastic radiation therapy: Secondary | ICD-10-CM | POA: Diagnosis not present

## 2016-07-17 ENCOUNTER — Ambulatory Visit
Admission: RE | Admit: 2016-07-17 | Discharge: 2016-07-17 | Disposition: A | Discharge status: Home / Self Care | Payer: Medicare Other | Source: Ambulatory Visit | Attending: Radiation Oncology | Admitting: Radiation Oncology

## 2016-07-17 DIAGNOSIS — Z510 Encounter for antineoplastic radiation therapy: Secondary | ICD-10-CM | POA: Diagnosis not present

## 2016-07-17 DIAGNOSIS — C61 Malignant neoplasm of prostate: Secondary | ICD-10-CM | POA: Diagnosis not present

## 2016-07-20 ENCOUNTER — Ambulatory Visit
Admission: RE | Admit: 2016-07-20 | Discharge: 2016-07-20 | Disposition: A | Discharge status: Home / Self Care | Payer: Medicare Other | Source: Ambulatory Visit | Attending: Radiation Oncology | Admitting: Radiation Oncology

## 2016-07-20 DIAGNOSIS — C61 Malignant neoplasm of prostate: Secondary | ICD-10-CM | POA: Diagnosis not present

## 2016-07-20 DIAGNOSIS — Z510 Encounter for antineoplastic radiation therapy: Secondary | ICD-10-CM | POA: Diagnosis not present

## 2016-07-21 ENCOUNTER — Ambulatory Visit: Payer: Medicare Other

## 2016-07-21 ENCOUNTER — Ambulatory Visit
Admission: RE | Admit: 2016-07-21 | Discharge: 2016-07-21 | Disposition: A | Discharge status: Home / Self Care | Payer: Medicare Other | Source: Ambulatory Visit | Attending: Radiation Oncology | Admitting: Radiation Oncology

## 2016-07-21 DIAGNOSIS — Z510 Encounter for antineoplastic radiation therapy: Secondary | ICD-10-CM | POA: Diagnosis not present

## 2016-07-21 DIAGNOSIS — C61 Malignant neoplasm of prostate: Secondary | ICD-10-CM | POA: Diagnosis not present

## 2016-07-22 ENCOUNTER — Encounter: Payer: Self-pay | Admitting: Internal Medicine

## 2016-07-22 ENCOUNTER — Ambulatory Visit: Payer: Medicare Other

## 2016-07-22 ENCOUNTER — Ambulatory Visit
Admission: RE | Admit: 2016-07-22 | Discharge: 2016-07-22 | Disposition: A | Discharge status: Home / Self Care | Payer: Medicare Other | Source: Ambulatory Visit | Attending: Radiation Oncology | Admitting: Radiation Oncology

## 2016-07-22 ENCOUNTER — Ambulatory Visit (INDEPENDENT_AMBULATORY_CARE_PROVIDER_SITE_OTHER): Payer: Medicare Other | Admitting: Internal Medicine

## 2016-07-22 VITALS — BP 124/64 | HR 85 | Temp 98.5°F | Resp 16 | Ht 72.0 in | Wt 234.0 lb

## 2016-07-22 DIAGNOSIS — C61 Malignant neoplasm of prostate: Secondary | ICD-10-CM

## 2016-07-22 DIAGNOSIS — E7800 Pure hypercholesterolemia, unspecified: Secondary | ICD-10-CM | POA: Diagnosis not present

## 2016-07-22 DIAGNOSIS — I10 Essential (primary) hypertension: Secondary | ICD-10-CM | POA: Diagnosis not present

## 2016-07-22 DIAGNOSIS — Z510 Encounter for antineoplastic radiation therapy: Secondary | ICD-10-CM | POA: Diagnosis not present

## 2016-07-22 DIAGNOSIS — E1165 Type 2 diabetes mellitus with hyperglycemia: Secondary | ICD-10-CM

## 2016-07-22 LAB — POCT GLYCOSYLATED HEMOGLOBIN (HGB A1C): Hemoglobin A1C: 7.5

## 2016-07-22 MED ORDER — GLIPIZIDE ER 2.5 MG PO TB24: 2.5000 mg | ORAL_TABLET | Freq: Every day | ORAL | 3 refills | Status: DC

## 2016-07-22 MED ORDER — TAMSULOSIN HCL 0.4 MG PO CAPS: 0.4000 mg | ORAL_CAPSULE | Freq: Every day | ORAL | 5 refills | Status: DC

## 2016-07-22 NOTE — Assessment & Plan Note (Signed)
Mild uncontrolled; not willing to change janumet to bid (non ER dosing), for a1c today, add glipizde 2.5, f/u with Dr Dwyane Dee as well

## 2016-07-22 NOTE — Patient Instructions (Addendum)
Your A1c was mildly elevated today at 7.5 Please take all new medication as prescribed - the glipizide ER 2.5 mg per day  OK to take the Flomax (generic = tamsulosin) as per Dr Tammi Klippel  Please only take the invokana once in the AM, as this can make urination overnight worse  Please continue all other medications as before  Please have the pharmacy call with any other refills you may need.  Please continue your efforts at being more active, low cholesterol diet, and weight control.  You will be contacted regarding the referral for: Dr Dwyane Dee  Please keep your appointments with your specialists as you may have planned

## 2016-07-22 NOTE — Assessment & Plan Note (Signed)
Lab Results  Component Value Date   LDLCALC 49 01/31/2016  stable, cont diet

## 2016-07-22 NOTE — Assessment & Plan Note (Signed)
stable overall by history and exam, recent data reviewed with pt, and pt to continue medical treatment as before,  to f/u any worsening symptoms or concerns BP Readings from Last 3 Encounters:  07/22/16 124/64  07/13/16 112/76  05/01/16 126/78

## 2016-07-22 NOTE — Progress Notes (Signed)
Subjective:    Patient ID: Clinton Kirby, male    DOB: [birth year], 71 y.o.   MRN: [id number]  HPI Here to f/u; overall doing ok,  Pt denies chest pain, increasing sob or doe, wheezing, orthopnea, PND, increased LE swelling, palpitations, dizziness or syncope.  Pt denies new neurological symptoms such as new headache, or facial or extremity weakness or numbness.  Pt denies polydipsia, polyuria, or low sugar episode.   Pt denies new neurological symptoms such as new headache, or facial or extremity weakness or numbness.   Pt states overall good compliance with meds, mostly trying to follow appropriate diet, with wt overall stable,  but little exercise however.  CBG';s checked in AM only - 140-160, but having worse nocturia, wondering if his sugars are increasing.  Taking invokana twice per day instead of once as he is trying to get the sugars.  Not sure of his meds except to say he is taking 9 pills every day, tired of taking pills, keeps a list on his phone.  Only takes lyrica bid.  Also sees Dr Dwyane Dee for DM, but has not f/u recently.  Not taking the flomax started per Dr Tammi Klippel in May 25.  Not willing to consider insulin today Past Medical History:  Diagnosis Date  . Balanitis   . CAD (coronary artery disease)   . Depression   . Diabetes mellitus type II   . ED (erectile dysfunction)   . HTN (hypertension)   . Hyperlipidemia   . Old MI (myocardial infarction) 12/07/2013  . Prostate cancer [facility])    Past Surgical History:  Procedure Laterality Date  . CORONARY ARTERY BYPASS GRAFT  10/2015  . PROSTATE BIOPSY    . UMBILICAL HERNIA REPAIR      reports that he has never smoked. He has never used smokeless tobacco. He reports that he does not drink alcohol or use drugs. family history includes Cancer in his father; Diabetes in his mother and other. No Known Allergies Current Outpatient Prescriptions on File Prior to Visit  Medication Sig Dispense Refill  . atorvastatin (LIPITOR) 40 MG  tablet TAKE ONE TABLET BY MOUTH ONCE DAILY 90 tablet 1  . Cholecalciferol (VITAMIN D3) 1000 units CAPS Take 1 capsule by mouth.    . clotrimazole-betamethasone (LOTRISONE) cream Apply topically 2 (two) times daily. 45 g 1  . glucose blood (ONETOUCH VERIO) test strip 1 each by Other route 2 (two) times daily. Use to check blood sugars twice a day Dx E11.9 100 each 3  . INVOKANA 300 MG TABS tablet Take 1 tablet (300 mg total) by mouth daily. 30 tablet 3  . losartan (COZAAR) 50 MG tablet Take 1 tablet (50 mg total) by mouth daily. 90 tablet 3  . ONETOUCH DELICA LANCETS FINE MISC Use to help check blood sugars daily Dx E11.9 100 each 3  . pregabalin (LYRICA) 50 MG capsule Take 50 mg by mouth 3 (three) times daily. Pt report taking twice daily    . SitaGLIPtin-MetFORMIN HCl (JANUMET XR) [phone] MG TB24 1 po qd 30 tablet 11  . tamsulosin (FLOMAX) 0.4 MG CAPS capsule Take 1 capsule (0.4 mg total) by mouth daily after supper. 30 capsule 5  . carvedilol (COREG) 12.5 MG tablet Take 1 tablet (12.5 mg total) by mouth 2 (two) times daily. 180 tablet 3   Current Facility-Administered Medications on File Prior to Visit  Medication Dose Route Frequency Provider Last Rate Last Dose  . aspirin chewable tablet 81 mg  81  mg Oral Once Martinique, Peter M, MD       Review of Systems   Constitutional: Negative for other unusual diaphoresis or sweats HENT: Negative for ear discharge or swelling Eyes: Negative for other worsening visual disturbances Respiratory: Negative for stridor or other swelling  Gastrointestinal: Negative for worsening distension or other blood Genitourinary: Negative for retention or other urinary change Musculoskeletal: Negative for other MSK pain or swelling Skin: Negative for color change or other new lesions Neurological: Negative for worsening tremors and other numbness  Psychiatric/Behavioral: Negative for worsening agitation or other fatigue All otherwise neg per pt    Objective:    Physical Exam BP 124/64   Pulse 85   Temp 98.5 F (36.9 C) (Oral)   Resp 16   Ht 6' (1.829 m)   Wt 234 lb (106.1 kg)   SpO2 98%   BMI 31.74 kg/m  VS noted,  Constitutional: Pt appears in NAD HENT: Head: NCAT.  Right Ear: External ear normal.  Left Ear: External ear normal.  Eyes: . Pupils are equal, round, and reactive to light. Conjunctivae and EOM are normal Nose: without d/c or deformity Neck: Neck supple. Gross normal ROM Cardiovascular: Normal rate and regular rhythm.   Pulmonary/Chest: Effort normal and breath sounds without rales or wheezing.  Abd:  Soft, NT, ND, + BS, no organomegaly Neurological: Pt is alert. At baseline orientation, motor grossly intact Skin: Skin is warm. No rashes, other new lesions, no LE edema Psychiatric: Pt behavior is normal without agitation  No other exam findings  Lab Results  Component Value Date   WBC 7.6 11/09/2014   HGB 16.7 11/09/2014   HCT 49.5 11/09/2014   PLT 303.0 11/09/2014   GLUCOSE 178 (H) 05/01/2016   CHOL 129 01/31/2016   TRIG 167.0 (H) 01/31/2016   HDL 47.20 01/31/2016   LDLDIRECT 136.0 11/09/2014   LDLCALC 49 01/31/2016   ALT 23 01/31/2016   AST 17 01/31/2016   NA 141 05/01/2016   K 4.0 05/01/2016   CL 105 05/01/2016   CREATININE 0.93 05/01/2016   BUN 18 05/01/2016   CO2 28 05/01/2016   TSH 0.72 11/09/2014   PSA 41.85 (H) 06/25/2015   HGBA1C 8.3 (H) 05/01/2016   MICROALBUR 1.0 01/08/2014   POCT - a1c - 7.5    Assessment & Plan:

## 2016-07-22 NOTE — Assessment & Plan Note (Signed)
D/w pt - ok to start the flomax per Dr Tammi Klippel, I resent rx

## 2016-07-23 ENCOUNTER — Ambulatory Visit: Payer: Medicare Other

## 2016-07-23 ENCOUNTER — Ambulatory Visit
Admission: RE | Admit: 2016-07-23 | Discharge: 2016-07-23 | Disposition: A | Discharge status: Home / Self Care | Payer: Medicare Other | Source: Ambulatory Visit | Attending: Radiation Oncology | Admitting: Radiation Oncology

## 2016-07-23 DIAGNOSIS — C61 Malignant neoplasm of prostate: Secondary | ICD-10-CM | POA: Diagnosis not present

## 2016-07-23 DIAGNOSIS — Z510 Encounter for antineoplastic radiation therapy: Secondary | ICD-10-CM | POA: Diagnosis not present

## 2016-07-24 ENCOUNTER — Ambulatory Visit
Admission: RE | Admit: 2016-07-24 | Discharge: 2016-07-24 | Disposition: A | Discharge status: Home / Self Care | Payer: Medicare Other | Source: Ambulatory Visit | Attending: Radiation Oncology | Admitting: Radiation Oncology

## 2016-07-24 ENCOUNTER — Ambulatory Visit: Payer: Medicare Other

## 2016-07-24 DIAGNOSIS — C61 Malignant neoplasm of prostate: Secondary | ICD-10-CM | POA: Diagnosis not present

## 2016-07-24 DIAGNOSIS — Z510 Encounter for antineoplastic radiation therapy: Secondary | ICD-10-CM | POA: Diagnosis not present

## 2016-07-27 ENCOUNTER — Ambulatory Visit
Admission: RE | Admit: 2016-07-27 | Discharge: 2016-07-27 | Disposition: A | Discharge status: Home / Self Care | Payer: Medicare Other | Source: Ambulatory Visit | Attending: Radiation Oncology | Admitting: Radiation Oncology

## 2016-07-27 DIAGNOSIS — Z510 Encounter for antineoplastic radiation therapy: Secondary | ICD-10-CM | POA: Diagnosis not present

## 2016-07-27 DIAGNOSIS — C61 Malignant neoplasm of prostate: Secondary | ICD-10-CM | POA: Diagnosis not present

## 2016-07-28 ENCOUNTER — Ambulatory Visit
Admission: RE | Admit: 2016-07-28 | Discharge: 2016-07-28 | Disposition: A | Discharge status: Home / Self Care | Payer: Medicare Other | Source: Ambulatory Visit | Attending: Radiation Oncology | Admitting: Radiation Oncology

## 2016-07-28 DIAGNOSIS — Z510 Encounter for antineoplastic radiation therapy: Secondary | ICD-10-CM | POA: Diagnosis not present

## 2016-07-28 DIAGNOSIS — C61 Malignant neoplasm of prostate: Secondary | ICD-10-CM | POA: Diagnosis not present

## 2016-07-29 ENCOUNTER — Ambulatory Visit
Admission: RE | Admit: 2016-07-29 | Discharge: 2016-07-29 | Disposition: A | Discharge status: Home / Self Care | Payer: Medicare Other | Source: Ambulatory Visit | Attending: Radiation Oncology | Admitting: Radiation Oncology

## 2016-07-29 DIAGNOSIS — C61 Malignant neoplasm of prostate: Secondary | ICD-10-CM | POA: Diagnosis not present

## 2016-07-29 DIAGNOSIS — Z510 Encounter for antineoplastic radiation therapy: Secondary | ICD-10-CM | POA: Diagnosis not present

## 2016-07-30 ENCOUNTER — Ambulatory Visit
Admission: RE | Admit: 2016-07-30 | Discharge: 2016-07-30 | Disposition: A | Discharge status: Home / Self Care | Payer: Medicare Other | Source: Ambulatory Visit | Attending: Radiation Oncology | Admitting: Radiation Oncology

## 2016-07-30 DIAGNOSIS — C61 Malignant neoplasm of prostate: Secondary | ICD-10-CM | POA: Diagnosis not present

## 2016-07-30 DIAGNOSIS — L089 Local infection of the skin and subcutaneous tissue, unspecified: Secondary | ICD-10-CM | POA: Diagnosis not present

## 2016-07-30 DIAGNOSIS — Z510 Encounter for antineoplastic radiation therapy: Secondary | ICD-10-CM | POA: Diagnosis not present

## 2016-07-30 DIAGNOSIS — L080 Pyoderma: Secondary | ICD-10-CM | POA: Diagnosis not present

## 2016-07-30 DIAGNOSIS — L304 Erythema intertrigo: Secondary | ICD-10-CM | POA: Diagnosis not present

## 2016-07-31 ENCOUNTER — Ambulatory Visit
Admission: RE | Admit: 2016-07-31 | Discharge: 2016-07-31 | Disposition: A | Discharge status: Home / Self Care | Payer: Medicare Other | Source: Ambulatory Visit | Attending: Radiation Oncology | Admitting: Radiation Oncology

## 2016-07-31 DIAGNOSIS — C61 Malignant neoplasm of prostate: Secondary | ICD-10-CM | POA: Diagnosis not present

## 2016-07-31 DIAGNOSIS — Z510 Encounter for antineoplastic radiation therapy: Secondary | ICD-10-CM | POA: Diagnosis not present

## 2016-08-03 ENCOUNTER — Ambulatory Visit
Admission: RE | Admit: 2016-08-03 | Discharge: 2016-08-03 | Disposition: A | Discharge status: Home / Self Care | Payer: Medicare Other | Source: Ambulatory Visit | Attending: Radiation Oncology | Admitting: Radiation Oncology

## 2016-08-03 ENCOUNTER — Ambulatory Visit: Payer: Medicare Other | Admitting: Internal Medicine

## 2016-08-03 DIAGNOSIS — C61 Malignant neoplasm of prostate: Secondary | ICD-10-CM | POA: Diagnosis not present

## 2016-08-03 DIAGNOSIS — Z510 Encounter for antineoplastic radiation therapy: Secondary | ICD-10-CM | POA: Diagnosis not present

## 2016-08-04 ENCOUNTER — Ambulatory Visit
Admission: RE | Admit: 2016-08-04 | Discharge: 2016-08-04 | Disposition: A | Discharge status: Home / Self Care | Payer: Medicare Other | Source: Ambulatory Visit | Attending: Radiation Oncology | Admitting: Radiation Oncology

## 2016-08-04 DIAGNOSIS — Z510 Encounter for antineoplastic radiation therapy: Secondary | ICD-10-CM | POA: Diagnosis not present

## 2016-08-04 DIAGNOSIS — C61 Malignant neoplasm of prostate: Secondary | ICD-10-CM | POA: Diagnosis not present

## 2016-08-05 ENCOUNTER — Ambulatory Visit
Admission: RE | Admit: 2016-08-05 | Discharge: 2016-08-05 | Disposition: A | Discharge status: Home / Self Care | Payer: Medicare Other | Source: Ambulatory Visit | Attending: Radiation Oncology | Admitting: Radiation Oncology

## 2016-08-05 DIAGNOSIS — C61 Malignant neoplasm of prostate: Secondary | ICD-10-CM | POA: Diagnosis not present

## 2016-08-05 DIAGNOSIS — Z510 Encounter for antineoplastic radiation therapy: Secondary | ICD-10-CM | POA: Diagnosis not present

## 2016-08-06 ENCOUNTER — Ambulatory Visit
Admission: RE | Admit: 2016-08-06 | Discharge: 2016-08-06 | Disposition: A | Discharge status: Home / Self Care | Payer: Medicare Other | Source: Ambulatory Visit | Attending: Radiation Oncology | Admitting: Radiation Oncology

## 2016-08-06 DIAGNOSIS — C61 Malignant neoplasm of prostate: Secondary | ICD-10-CM | POA: Diagnosis not present

## 2016-08-06 DIAGNOSIS — Z510 Encounter for antineoplastic radiation therapy: Secondary | ICD-10-CM | POA: Diagnosis not present

## 2016-08-07 ENCOUNTER — Ambulatory Visit
Admission: RE | Admit: 2016-08-07 | Discharge: 2016-08-07 | Disposition: A | Discharge status: Home / Self Care | Payer: Medicare Other | Source: Ambulatory Visit | Attending: Radiation Oncology | Admitting: Radiation Oncology

## 2016-08-07 ENCOUNTER — Encounter: Payer: Self-pay | Admitting: Medical Oncology

## 2016-08-07 DIAGNOSIS — C61 Malignant neoplasm of prostate: Secondary | ICD-10-CM | POA: Diagnosis not present

## 2016-08-07 DIAGNOSIS — Z510 Encounter for antineoplastic radiation therapy: Secondary | ICD-10-CM | POA: Diagnosis not present

## 2016-08-07 NOTE — Progress Notes (Signed)
Clinton Kirby doing well with radiation with 2 remaining treatments. He is taking Flomax which has helped with urinary symptoms. We discussed once he completes radiation his fatigue, urinary symptoms and diarrhea will begin to improve. He voiced understanding and I asked him to call me with questions or concerns.

## 2016-08-10 ENCOUNTER — Ambulatory Visit: Payer: Medicare Other

## 2016-08-10 ENCOUNTER — Ambulatory Visit
Admission: RE | Admit: 2016-08-10 | Discharge: 2016-08-10 | Disposition: A | Discharge status: Home / Self Care | Payer: Medicare Other | Source: Ambulatory Visit | Attending: Radiation Oncology | Admitting: Radiation Oncology

## 2016-08-10 DIAGNOSIS — Z510 Encounter for antineoplastic radiation therapy: Secondary | ICD-10-CM | POA: Diagnosis not present

## 2016-08-10 DIAGNOSIS — C61 Malignant neoplasm of prostate: Secondary | ICD-10-CM | POA: Diagnosis not present

## 2016-08-11 ENCOUNTER — Encounter: Payer: Self-pay | Admitting: Radiation Oncology

## 2016-08-11 ENCOUNTER — Ambulatory Visit
Admission: RE | Admit: 2016-08-11 | Discharge: 2016-08-11 | Disposition: A | Discharge status: Home / Self Care | Payer: Medicare Other | Source: Ambulatory Visit | Attending: Radiation Oncology | Admitting: Radiation Oncology

## 2016-08-11 DIAGNOSIS — C61 Malignant neoplasm of prostate: Secondary | ICD-10-CM | POA: Diagnosis not present

## 2016-08-11 DIAGNOSIS — Z510 Encounter for antineoplastic radiation therapy: Secondary | ICD-10-CM | POA: Diagnosis not present

## 2016-08-13 NOTE — Progress Notes (Signed)
  Radiation Oncology         [phone]) [phone] ________________________________  Name: Clinton Kirby MRN: [id number]  Date: 08/11/2016  DOB: 04-20-1945  End of Treatment Note  Diagnosis:   71 y.o. gentleman with stage T1c adenocarcinoma of the prostate with a Gleason's score of 4+5 and a PSA of 41.53     Indication for treatment:  Curative, Definitive Radiotherapy       Radiation treatment dates:   06/15/16 - 08/11/16  Site/dose:  1. The prostate, seminal vesicles, and pelvic lymph nodes were initially treated to 45 Gy in 25 fractions of 1.8 Gy  2. The prostate only was boosted to 75 Gy with 15 additional fractions of 2.0 Gy   Beams/energy:  1. The prostate, seminal vesicles, and pelvic lymph nodes were initially treated using VMAT intensity modulated radiotherapy delivering 6 megavolt photons. Image guidance was performed with CB-CT studies prior to each fraction. He was immobilized with a body fix lower extremity mold.  2. The prostate only was boosted using VMAT intensity modulated radiotherapy delivering 6 megavolt photons. Image guidance was performed with CB-CT studies prior to each fraction. He was immobilized with a body fix lower extremity mold.  Narrative: The patient tolerated radiation treatment relatively well. He denied urinary frequency, urgency, hematuria, or pain. He reports fatigue and he took Imodium PRN for diarrhea. He had nocturia x3-6 and he is Flomax. He has hot flashes.   Plan: The patient has completed radiation treatment. He will return to radiation oncology clinic for routine followup in one month. I advised him to call or return sooner if he has any questions or concerns related to his recovery or treatment. ________________________________  Sheral Apley. Tammi Klippel, M.D.   This document serves as a record of services personally performed by Tyler Pita MD. It was created on his behalf by Delton Coombes, a trained medical scribe. The creation of this record is based  on the scribe's personal observations and the provider's statements to them. This document has been checked and approved by the attending provider.

## 2016-08-21 ENCOUNTER — Other Ambulatory Visit: Payer: Self-pay | Admitting: Internal Medicine

## 2016-08-31 ENCOUNTER — Ambulatory Visit (INDEPENDENT_AMBULATORY_CARE_PROVIDER_SITE_OTHER): Payer: Medicare Other | Admitting: Endocrinology

## 2016-08-31 ENCOUNTER — Ambulatory Visit: Payer: Medicare Other | Admitting: Internal Medicine

## 2016-08-31 ENCOUNTER — Encounter: Payer: Self-pay | Admitting: Endocrinology

## 2016-08-31 VITALS — BP 128/78 | HR 98 | Ht 72.0 in | Wt 242.8 lb

## 2016-08-31 DIAGNOSIS — E1149 Type 2 diabetes mellitus with other diabetic neurological complication: Secondary | ICD-10-CM

## 2016-08-31 DIAGNOSIS — E1165 Type 2 diabetes mellitus with hyperglycemia: Secondary | ICD-10-CM | POA: Diagnosis not present

## 2016-08-31 LAB — GLUCOSE, POCT (MANUAL RESULT ENTRY): POC GLUCOSE: 242 mg/dL — AB (ref 70–99)

## 2016-08-31 MED ORDER — GABAPENTIN 300 MG PO CAPS: 300.0000 mg | ORAL_CAPSULE | Freq: Every day | ORAL | 3 refills | Status: DC

## 2016-08-31 NOTE — Progress Notes (Signed)
Patient ID: Clinton Kirby, male   DOB: [birth year], 71 y.o.   MRN: [id number]   Reason for Appointment : Followup of 2 Diabetes  History of Present Illness          Diagnosis: Type 2 diabetes mellitus, date of diagnosis:2004      Prior history: At the time of diagnosis he was having frequent urination and his glucose was about 300 He thinks he was put on metformin and glimepiride at that time He believes his blood sugars were fairly well-controlled for the first few years  However review of his records indicate that his A1c has been around 12% since at least 2011 He was started on Invokana on 12/07/12. Prior to this home blood sugars were about 180-190 fasting.   His lab glucose readings were usually over 200  Recent history:   He has not been seen in follow-up for over 2 years  Non-insulin hypoglycemic drugs the patient is taking are: Invokana 300 mg daily, glipizide ER 2.5 mg daily, Janumet XR 100/1000 daily  His A1c is 7.5 as of 6/18  Current management, blood sugar readings and problems identified:  He is sporadically checking his blood sugars in the mornings and does not remember the readings much  Does not know what his postprandial readings are  He says that sometimes he will Invokana twice a day if his blood sugar is higher  His lab glucose reading in March was 178  He has gained weight since his last visit and still not doing any significant exercise  Does not have a meal plan to follow; today had a large fast food meal with regular soft drinks and blood sugar = 242 after eating     Glucose monitoring:  sporadic         Glucometer:   One Touch Blood Glucose readings from  recall: Recently 122 in am  Hypoglycemia frequency: Never.          Self-care: The diet that the patient has been following is: None   Meals: 3 meals per day.  for breakfast has  eggs, meat, toast but sometimes cereal. Tries to eat salads sometimes    Physical activity: exercise:  not walking       Dietician visit: Most recent:  At diagnosis only                    Side effects from medications have been: none   Wt Readings from Last 3 Encounters:  08/31/16 242 lb 12.8 oz (110.1 kg)  07/22/16 234 lb (106.1 kg)  07/13/16 235 lb (106.6 kg)     Lab Results  Component Value Date   HGBA1C 7.5 07/22/2016   HGBA1C 8.3 (H) 05/01/2016   HGBA1C 7.2 (H) 01/31/2016   Lab Results  Component Value Date   MICROALBUR 1.0 01/08/2014   LDLCALC 49 01/31/2016   CREATININE 0.93 05/01/2016     Allergies as of 08/31/2016   No Known Allergies     Medication List       Accurate as of 08/31/16 11:59 PM. Always use your most recent med list.          atorvastatin 40 MG tablet Commonly known as:  LIPITOR TAKE ONE TABLET BY MOUTH ONCE DAILY   carvedilol 12.5 MG tablet Commonly known as:  COREG Take 1 tablet (12.5 mg total) by mouth 2 (two) times daily.   clotrimazole-betamethasone cream Commonly known as:  LOTRISONE Apply topically 2 (two) times daily.  gabapentin 300 MG capsule Commonly known as:  NEURONTIN Take 1 capsule (300 mg total) by mouth at bedtime.   glipiZIDE 2.5 MG 24 hr tablet Commonly known as:  GLUCOTROL XL Take 1 tablet (2.5 mg total) by mouth daily with breakfast.   glucose blood test strip Commonly known as:  ONETOUCH VERIO 1 each by Other route 2 (two) times daily. Use to check blood sugars twice a day Dx E11.9   INVOKANA 300 MG Tabs tablet Generic drug:  canagliflozin Take 1 tablet (300 mg total) by mouth daily.   losartan 50 MG tablet Commonly known as:  COZAAR Take 1 tablet (50 mg total) by mouth daily.   ONETOUCH DELICA LANCETS FINE Misc Use to help check blood sugars daily Dx E11.9   SitaGLIPtin-MetFORMIN HCl [phone] MG Tb24 Commonly known as:  JANUMET XR 1 po qd   tamsulosin 0.4 MG Caps capsule Commonly known as:  FLOMAX Take 1 capsule (0.4 mg total) by mouth daily after supper.   Vitamin D3 1000 units Caps Take 1  capsule by mouth.       Allergies: No Known Allergies  Past Medical History:  Diagnosis Date  . Balanitis   . CAD (coronary artery disease)   . Depression   . Diabetes mellitus type II   . ED (erectile dysfunction)   . HTN (hypertension)   . Hyperlipidemia   . Old MI (myocardial infarction) 12/07/2013  . Prostate cancer [facility])     Past Surgical History:  Procedure Laterality Date  . CORONARY ARTERY BYPASS GRAFT  10/2015  . PROSTATE BIOPSY    . UMBILICAL HERNIA REPAIR      Family History  Problem Relation Age of Onset  . Diabetes Mother   . Cancer Father        ?  . Diabetes Other     Social History:  reports that he has never smoked. He has never used smokeless tobacco. He reports that he does not drink alcohol or use drugs.    Review of Systems       Lipids: Last LDL 49, taking Lipitor 40 mg   Lab Results  Component Value Date   CHOL 129 01/31/2016   HDL 47.20 01/31/2016   LDLCALC 49 01/31/2016   LDLDIRECT 136.0 11/09/2014   TRIG 167.0 (H) 01/31/2016   CHOLHDL 3 01/31/2016           HYPERTENSION: Well controlled with 50mg  losartan and Coreg      Feet burn and sting, He was previously given Lyrica, but has not been more to get this recently from the previous prescribing physician  He has trouble sleeping at night because of the discomfort and has not discussed with PCP   LABS:   Office Visit on 08/31/2016  Component Date Value Ref Range Status  . POC Glucose 08/31/2016 242* 70 - 99 mg/dl Final    Physical Examination:  BP 128/78   Pulse 98   Ht 6' (1.829 m)   Wt 242 lb 12.8 oz (110.1 kg)   SpO2 97%   BMI 32.93 kg/m      No pedal edema  Diabetic Foot Exam - Simple   Simple Foot Form Diabetic Foot exam was performed with the following findings:  Yes   Visual Inspection No deformities, no ulcerations, no other skin breakdown bilaterally:  Yes Sensation Testing See comments:  Yes Pulse Check Posterior Tibialis and Dorsalis pulse intact  bilaterally:  Yes Comments Decreased monofilament sensation in the distal toes bilaterally  ASSESSMENT/PLAN  Diabetes type 2, uncontrolled   See history of present illness for detailed discussion of current diabetes management, blood sugar patterns and problems identified  He has had persistently high A1c which is now relatively better He is able to take his Invokana more regularly now, previously had difficulty affording these Also probably benefiting from using his Janumet consistently along with low-dose glipizide  His last A1c was 7.5 but he is likely having some post prandial hyperglycemia He is not watching his diet, getting weight gain and not exercising as discussed today Day-to-day management of his diabetes discussed, explained to him that his blood sugars are well over 200 if he is not watching his diet such as today and he was not aware of this He will be scheduled to see the dietitian Discussed timing of blood sugar monitoring and targets of blood sugars For now will continue the medications unchanged  NEUROPATHY: He is not on any medications and does have significant discomfort especially at bedtime affecting sleep He does not think he has tried gabapentin before and we can try this at least at bedtime Objectively does have mild sensory loss, reminded him to check his feet regularly and not go without shoes  LIPIDS: Will need follow-up on next visit  Patient Instructions  Check blood sugars on waking up  2/7 days  Also check blood sugars about 2 hours after a meal and do this after different meals by rotation  Recommended blood sugar levels on waking up is 90-130 and about 2 hours after meal is 130-160  Please bring your blood sugar monitor to each visit, thank you  Restart walking when able to  No fried food/fast food     Staci Carver 09/01/2016, 11:43 AM

## 2016-08-31 NOTE — Patient Instructions (Signed)
Check blood sugars on waking up  2/7 days  Also check blood sugars about 2 hours after a meal and do this after different meals by rotation  Recommended blood sugar levels on waking up is 90-130 and about 2 hours after meal is 130-160  Please bring your blood sugar monitor to each visit, thank you  Restart walking when able to  No fried food/fast food

## 2016-09-01 NOTE — Progress Notes (Signed)
Cardiology Office Note    Date:  09/02/2016   ID:  Clinton Kirby, DOB 1945-09-04, MRN [id number]  PCP:  [name], MD  Cardiologist:  [name], MD    History of Present Illness:  Clinton Kirby is a 71 y.o. male with known history of coronary disease and  poorly controlled diabetes mellitus type 2 as well as hyperlipidemia, hypertension and peripheral neuropathy.  He is status post inferior myocardial infarction in February of 2004 while in [place]. This was treated with a drug-eluting stent to the right coronary. In 2005 he had some abnormality on a stress test and underwent repeat cardiac catheterization. This showed the stent to be widely patent and he had no other significant disease. Stress test here was in July 2014 showed a fixed inferior wall scar and mild LV dysfunction.   He is  s/p CABG in [facility] on 11/11/15. H was admitted to [facility] in late Sept with chest pain. He ruled out for MI.  He  had an abnormal Myoview showing inferior and apical scar with ischemia in the anterior wall and apex. EF 28%.  He underwent cardiac cath 11/06/15. His cath showed a patent RCA stent but an occluded LAD and 70% stenosis in the OM1 and OM2.  He underwent CABG x 1 with LIMA-LAD 11/11/15 by Dr [name]. The OMs were felt to be too small for bypass. Post OP echo 11/13/15 showed his EF to be 40%. Carotid dopplers showed no obstructive disease. It was noted on cardiac cath that his right innominate artery was very tortuous.   Cath films reviewed personally by me:  11/07/15. This showed occlusion of the proximal LAD with some collateral. There was a large ramus vessel that bifurcated in the mid vessel. There was 40-50% disease at the bifurcation. The first OM was a moderate sized vessel with 80-90% stenosis. The second OM was smaller with a 70% stenosis. The RCA was patent in the mid vessel at site of prior stent. There was diffuse 40% disease in the proximal vessel. It is  surprising that the OMs were too small for bypass. If he has refractory angina could be considered for PCI of OM1.  On follow up today he is doing well from a cardiac standpoint. He denies any chest pain, SOB, palpitations, or dizziness. He has no edema. He was diagnosed with prostate CAD has undergone Radiation therapy for this. He has diabetic neuropathy and was started on gabapentin. he is tolerating his medication well.     Past Medical History:  Diagnosis Date  . Balanitis   . CAD (coronary artery disease)   . Depression   . Diabetes mellitus type II   . ED (erectile dysfunction)   . HTN (hypertension)   . Hyperlipidemia   . Old MI (myocardial infarction) 12/07/2013  . Prostate cancer [facility])     Past Surgical History:  Procedure Laterality Date  . CORONARY ARTERY BYPASS GRAFT  10/2015  . PROSTATE BIOPSY    . UMBILICAL HERNIA REPAIR      Current Medications: Outpatient Medications Prior to Visit  Medication Sig Dispense Refill  . atorvastatin (LIPITOR) 40 MG tablet TAKE ONE TABLET BY MOUTH ONCE DAILY 90 tablet 1  . Cholecalciferol (VITAMIN D3) 1000 units CAPS Take 1 capsule by mouth.    . clotrimazole-betamethasone (LOTRISONE) cream Apply topically 2 (two) times daily. 45 g 1  . gabapentin (NEURONTIN) 300 MG capsule Take 1 capsule (300 mg total) by mouth at  bedtime. 30 capsule 3  . glucose blood (ONETOUCH VERIO) test strip 1 each by Other route 2 (two) times daily. Use to check blood sugars twice a day Dx E11.9 100 each 3  . INVOKANA 300 MG TABS tablet Take 1 tablet (300 mg total) by mouth daily. 30 tablet 3  . losartan (COZAAR) 50 MG tablet Take 1 tablet (50 mg total) by mouth daily. 90 tablet 3  . ONETOUCH DELICA LANCETS FINE MISC Use to help check blood sugars daily Dx E11.9 100 each 3  . tamsulosin (FLOMAX) 0.4 MG CAPS capsule Take 1 capsule (0.4 mg total) by mouth daily after supper. 30 capsule 5  . carvedilol (COREG) 12.5 MG tablet Take 1 tablet (12.5 mg total) by mouth 2  (two) times daily. 180 tablet 3  . glipiZIDE (GLUCOTROL XL) 2.5 MG 24 hr tablet Take 1 tablet (2.5 mg total) by mouth daily with breakfast. 90 tablet 3  . SitaGLIPtin-MetFORMIN HCl (JANUMET XR) [phone] MG TB24 1 po qd 30 tablet 11   Facility-Administered Medications Prior to Visit  Medication Dose Route Frequency Provider Last Rate Last Dose  . aspirin chewable tablet 81 mg  81 mg Oral Once Martinique, Peter M, MD         Allergies:   Patient has no known allergies.   Social History   Social History  . Marital status: Married    Spouse name: N/A  . Number of children: 1  . Years of education: N/A   Occupational History  . truck driver-self employed    Social History Main Topics  . Smoking status: Never Smoker  . Smokeless tobacco: Never Used  . Alcohol use No  . Drug use: No  . Sexual activity: Yes   Other Topics Concern  . None   Social History Narrative  . None     Family History:  The patient's family history includes Cancer in his father; Diabetes in his mother and other.   ROS:   Please see the history of present illness.    ROS All other systems reviewed and are negative.   PHYSICAL EXAM:   VS:  BP 131/75   Pulse 87   Ht 6' (1.829 m)   Wt 239 lb 12.8 oz (108.8 kg)   BMI 32.52 kg/m    GEN: Well nourished, well developed, in no acute distress  HEENT: normal  Neck: no JVD, carotid bruits, or masses Cardiac: RRR; no murmurs, rubs, or gallops,no edema  Respiratory:  clear to auscultation bilaterally, normal work of breathing GI: soft, nontender, nondistended, + BS MS: no deformity or atrophy  Skin: warm and dry, no rash Neuro:  Alert and Oriented x 3, Strength and sensation are intact Psych: euthymic mood, full affect  Wt Readings from Last 3 Encounters:  09/02/16 239 lb 12.8 oz (108.8 kg)  08/31/16 242 lb 12.8 oz (110.1 kg)  07/22/16 234 lb (106.1 kg)      Studies/Labs Reviewed:   EKG:  EKG is ordered today. NSR with LAD, old inferior and anterior  infarcts. I have personally reviewed and interpreted this study.   Recent Labs: 01/31/2016: ALT 23 05/01/2016: BUN 18; Creatinine, Ser 0.93; Potassium 4.0; Sodium 141   Lipid Panel    Component Value Date/Time   CHOL 129 01/31/2016 1411   TRIG 167.0 (H) 01/31/2016 1411   HDL 47.20 01/31/2016 1411   CHOLHDL 3 01/31/2016 1411   VLDL 33.4 01/31/2016 1411   LDLCALC 49 01/31/2016 1411   LDLDIRECT 136.0 11/09/2014  1543    Additional studies/ records that were reviewed today include:  Echo 05/07/16: Study Conclusions  - Left ventricle: Septal apical and inferobasal hypokinesis The   cavity size was normal. Wall thickness was increased in a pattern   of mild LVH. Systolic function was mildly to moderately reduced.   The estimated ejection fraction was in the range of 40% to 45%.   Wall motion was normal; there were no regional wall motion   abnormalities. Left ventricular diastolic function parameters   were normal. - Atrial septum: No defect or patent foramen ovale was identified.   ASSESSMENT:    1. CAD S/P percutaneous coronary angioplasty   2. CAB[facility] x 3 11/11/15 in [facility]   3. Dyslipidemia   4. Cardiomyopathy, ischemic   5. Chronic systolic heart failure (HCC)      PLAN:  In order of problems listed above:  1. Patient has recovered well from CABG. Continue current therapy. He is asymptomatic. Continue ASA, beta blocker.  2. Chronic systolic CHF. Last EF was  40%. He is asymptomatic. Will increase  Coreg to 25 mg bid. Continue to take losartan 50 mg daily. Will continue to titrate medication as tolerated. 3. Dyslipidemia is well controlled.  4. Prostate CA-s/p RT 5.   DM type 2 with neuropathy. Per Dr. Dwyane Dee.  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 He does have a history of CAD and CHF but is asymptomatic on appropriate medical therapy and I think should be cleared to drive.     Medication Adjustments/Labs and Tests Ordered: Current medicines are  reviewed at length with the patient today.  Concerns regarding medicines are outlined above.  Medication changes, Labs and Tests ordered today are listed in the Patient Instructions below. Patient Instructions  We will increase Coreg (carvedilol) to 25 mg twice a day  Continue your other therapy  I will see you in 6 months.    Signed, [name], MD  09/02/2016 9:40 AM    [facility] [address], [place], [place], [address] [phone]

## 2016-09-02 ENCOUNTER — Ambulatory Visit (INDEPENDENT_AMBULATORY_CARE_PROVIDER_SITE_OTHER): Payer: Medicare Other | Admitting: Cardiology

## 2016-09-02 ENCOUNTER — Telehealth: Payer: Self-pay

## 2016-09-02 ENCOUNTER — Encounter: Payer: Self-pay | Admitting: Cardiology

## 2016-09-02 VITALS — BP 131/75 | HR 87 | Ht 72.0 in | Wt 239.8 lb

## 2016-09-02 DIAGNOSIS — E785 Hyperlipidemia, unspecified: Secondary | ICD-10-CM | POA: Diagnosis not present

## 2016-09-02 DIAGNOSIS — I5022 Chronic systolic (congestive) heart failure: Secondary | ICD-10-CM | POA: Diagnosis not present

## 2016-09-02 DIAGNOSIS — Z951 Presence of aortocoronary bypass graft: Secondary | ICD-10-CM

## 2016-09-02 DIAGNOSIS — I2510 Atherosclerotic heart disease of native coronary artery without angina pectoris: Secondary | ICD-10-CM

## 2016-09-02 DIAGNOSIS — Z9861 Coronary angioplasty status: Secondary | ICD-10-CM

## 2016-09-02 DIAGNOSIS — I255 Ischemic cardiomyopathy: Secondary | ICD-10-CM | POA: Diagnosis not present

## 2016-09-02 MED ORDER — CARVEDILOL 25 MG PO TABS: 25.0000 mg | ORAL_TABLET | Freq: Two times a day (BID) | ORAL | 3 refills | Status: DC

## 2016-09-02 NOTE — Telephone Encounter (Signed)
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

## 2016-09-02 NOTE — Patient Instructions (Signed)
We will increase Coreg (carvedilol) to 25 mg twice a day  Continue your other therapy  I will see you in 6 months.

## 2016-09-03 ENCOUNTER — Encounter: Payer: Self-pay | Admitting: Urology

## 2016-09-03 ENCOUNTER — Ambulatory Visit
Admission: RE | Admit: 2016-09-03 | Discharge: 2016-09-03 | Disposition: A | Discharge status: Home / Self Care | Payer: Medicare Other | Source: Ambulatory Visit | Attending: Urology | Admitting: Urology

## 2016-09-03 VITALS — BP 131/76 | HR 83 | Temp 98.1°F | Resp 18 | Wt 243.4 lb

## 2016-09-03 DIAGNOSIS — C61 Malignant neoplasm of prostate: Secondary | ICD-10-CM | POA: Diagnosis not present

## 2016-09-03 NOTE — Progress Notes (Signed)
Radiation Oncology         [id number]) [phone] ________________________________  Name: Clinton Kirby MRN: [id number]  Date: 09/03/2016  DOB: 1945/10/07  Post Treatment Note  CC: Plotnikov, Evie Lacks, MD  [name], MD  Diagnosis:   71 y.o. gentleman with stage T1c adenocarcinoma of the prostate with a Gleason's score of 4+5 and a PSA of 41.53     Interval Since Last Radiation:  3 weeks  06/15/16 - 08/11/16:  1. The prostate, seminal vesicles, and pelvic lymph nodes were initially treated to 45 Gy in 25 fractions of 1.8 Gy  2. The prostate only was boosted to 75 Gy with 15 additional fractions of 2.0 Gy           Narrative:  The patient returns today for routine follow-up. He tolerated radiation treatment relatively well. He denied urinary frequency, urgency, hematuria, or pain. He did experience some fatigue and the need to use Imodium PRN for diarrhea. He had nocturia x3-6- on Flomax. He has hot flashes related to his ADT which was started 03/13/16.                       On review of systems, the patient states that he is doing well overall.  His energy level is gradually improving as are his LUTS.  Currently, he denies increased urgency, frequency, dysuria, gross hematuria, flank pain, fever or chills.  He continues with nocturia 2-3x/night which is manageable and improved since starting Flomax.  He denies abdominal pain, N/V or diarrhea.  He has a good appetite and is maintaining his weight.  He continues with hot flashes which he attributes to his ADT.  ALLERGIES:  has No Known Allergies.  Meds: Current Outpatient Prescriptions  Medication Sig Dispense Refill  . aspirin 81 MG tablet Take 81 mg by mouth daily.    [name] atorvastatin (LIPITOR) 40 MG tablet TAKE ONE TABLET BY MOUTH ONCE DAILY 90 tablet 1  . carvedilol (COREG) 25 MG tablet Take 1 tablet (25 mg total) by mouth 2 (two) times daily. 180 tablet 3  . Cholecalciferol (VITAMIN D3) 1000 units CAPS Take 1 capsule by mouth.    .  clotrimazole-betamethasone (LOTRISONE) cream Apply topically 2 (two) times daily. 45 g 1  . gabapentin (NEURONTIN) 300 MG capsule Take 1 capsule (300 mg total) by mouth at bedtime. 30 capsule 3  . glucose blood (ONETOUCH VERIO) test strip 1 each by Other route 2 (two) times daily. Use to check blood sugars twice a day Dx E11.9 100 each 3  . INVOKANA 300 MG TABS tablet Take 1 tablet (300 mg total) by mouth daily. 30 tablet 3  . losartan (COZAAR) 50 MG tablet Take 1 tablet (50 mg total) by mouth daily. 90 tablet 3  . ONETOUCH DELICA LANCETS FINE MISC Use to help check blood sugars daily Dx E11.9 100 each 3  . tamsulosin (FLOMAX) 0.4 MG CAPS capsule Take 1 capsule (0.4 mg total) by mouth daily after supper. 30 capsule 5  . SitaGLIPtin-MetFORMIN HCl (JANUMET XR) [phone] MG TB24 Take by mouth daily.     Current Facility-Administered Medications  Medication Dose Route Frequency Provider Last Rate Last Dose  . aspirin chewable tablet 81 mg  81 mg Oral Once Martinique, Peter M, MD        Physical Findings:  weight is 243 lb 6 oz (110.4 kg). His oral temperature is 98.1 F (36.7 C). His blood pressure is 131/76 and his pulse is  83. His respiration is 18 and oxygen saturation is 98%.  Pain Assessment Pain Score: 0-No pain/10 In general this is a well appearing african [place] male in no acute distress. He's alert and oriented x4 and appropriate throughout the examination. Cardiopulmonary assessment is negative for acute distress and he exhibits normal effort.   Lab Findings: Lab Results  Component Value Date   WBC 7.6 11/09/2014   HGB 16.7 11/09/2014   HCT 49.5 11/09/2014   MCV 89.7 11/09/2014   PLT 303.0 11/09/2014     Radiographic Findings: No results found.  Impression/Plan: 1. 71 y.o. gentleman with stage T1c adenocarcinoma of the prostate with a Gleason's score of 4+5 and a PSA of 41.53.    He will continue to follow up with urology for ongoing PSA determinations and has an appointment  scheduled with Dr. Diona Fanti on 09/09/16. He is encouraged to continue ADT with Lupron injections for a total of 2 years under the care and direction of Dr. Diona Fanti. He does report some concern in continuing the ADT long term as he fears marital discord secondary to his decreased libido.  He understands what to expect with regards to PSA monitoring going forward. I will look forward to following his response to correspondence with urology, and would be happy to continue to participate in his care if clinically indicated. I talked to the patient about what to expect in the future, including his risk for erectile dysfunction rectal bleeding. I encouraged him to call or return to the office if he has any questions regarding his previous radiation or possible radiation side effects. He was comfortable with this plan and will follow up as needed. 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 This was faxed to Reece Packer, [facility] at [phone] and a copy of this document has been placed in his file.    [name], PA-C

## 2016-09-08 ENCOUNTER — Other Ambulatory Visit: Payer: Self-pay

## 2016-09-08 MED ORDER — INVOKANA 300 MG PO TABS: 300.0000 mg | ORAL_TABLET | Freq: Every day | ORAL | 0 refills | Status: DC

## 2016-09-09 DIAGNOSIS — C61 Malignant neoplasm of prostate: Secondary | ICD-10-CM | POA: Diagnosis not present

## 2016-09-15 ENCOUNTER — Ambulatory Visit: Payer: Self-pay | Admitting: Urology

## 2016-09-24 ENCOUNTER — Ambulatory Visit: Payer: Self-pay | Admitting: Urology

## 2016-10-09 ENCOUNTER — Encounter: Payer: Self-pay | Admitting: Dietician

## 2016-10-09 ENCOUNTER — Encounter: Payer: Medicare Other | Attending: Endocrinology | Admitting: Dietician

## 2016-10-09 DIAGNOSIS — E1149 Type 2 diabetes mellitus with other diabetic neurological complication: Secondary | ICD-10-CM

## 2016-10-09 DIAGNOSIS — E1165 Type 2 diabetes mellitus with hyperglycemia: Secondary | ICD-10-CM | POA: Insufficient documentation

## 2016-10-09 DIAGNOSIS — Z713 Dietary counseling and surveillance: Secondary | ICD-10-CM | POA: Diagnosis not present

## 2016-10-09 NOTE — Progress Notes (Signed)
Diabetes Self-Management Education  Visit Type: First/Initial  Appt. Start Time: 1430 Appt. End Time: 0175  10/09/2016  [name], identified by name and date of birth, is a 71 y.o. male with a diagnosis of Diabetes: Type 2. Other hx includes MI, HTN, hyperlipidemia, and prostate cancer.  He just finished radiation therapy and states that he does not have the same strength or stamina.  His A1C was 7.5% which improved.  Weight hx: 270 lbs prior to CABG 11/2015 and reduced to 218 lbs after surgery.  Since he has increased to 244 lbs.  Medications include Janumet and Invokana.  Patient lives with his wife.  She does the shopping and cooking.  He is a retired Administrator.    ASSESSMENT  Height 6' (1.829 m), weight 244 lb (110.7 kg). Body mass index is 33.09 kg/m.      Diabetes Self-Management Education - 10/09/16 1443      Visit Information   Visit Type First/Initial     Initial Visit   Diabetes Type Type 2   Are you currently following a meal plan? No   Are you taking your medications as prescribed? Yes   Date Diagnosed 2004     Health Coping   How would you rate your overall health? Good     Psychosocial Assessment   Patient Belief/Attitude about Diabetes Motivated to manage diabetes   Self-care barriers Low literacy   Self-management support Doctor's office;Family   Other persons present Patient;Spouse/SO   Patient Concerns Nutrition/Meal planning;Glycemic Control;Weight Control   Special Needs None   Preferred Learning Style No preference indicated   Learning Readiness Ready   How often do you need to have someone help you when you read instructions, pamphlets, or other written materials from your doctor or pharmacy? 1 - Never   What is the last grade level you completed in school? 12th grade     Pre-Education Assessment   Patient understands the diabetes disease and treatment process. Needs Instruction   Patient understands incorporating nutritional  management into lifestyle. Needs Instruction   Patient undertands incorporating physical activity into lifestyle. Needs Instruction   Patient understands using medications safely. Needs Instruction   Patient understands monitoring blood glucose, interpreting and using results Needs Instruction   Patient understands prevention, detection, and treatment of acute complications. Needs Instruction   Patient understands prevention, detection, and treatment of chronic complications. Needs Instruction   Patient understands how to develop strategies to address psychosocial issues. Needs Instruction   Patient understands how to develop strategies to promote health/change behavior. Needs Instruction     Complications   Last HgB A1C per patient/outside source 7.5 %  07/2016   How often do you check your blood sugar? 1-2 times/day   Fasting Blood glucose range (mg/dL) 130-179   Postprandial Blood glucose range (mg/dL) 130-179  130   Number of hypoglycemic episodes per month 0   Number of hyperglycemic episodes per week 0   Have you had a dilated eye exam in the past 12 months? Yes   Have you had a dental exam in the past 12 months? No   Are you checking your feet? Yes   How many days per week are you checking your feet? 7     Dietary Intake   Breakfast raisin bran and granola 2% lactaid milk, extra raisins and sometimes berries OR cheerios and berries and lactaid OR egg beaters, veges, cheese, [place] or regular sausage, berries  9-10   Snack (morning) none  Lunch [place] or bologna or PB sandwich on [employer], salt free chips occasionally, oatmeal cookies or fig newtons  1-2   Snack (afternoon) occasional salt free chips   Dinner Pasta "for diabetics", hamburger, vegetables OR chicken, black eyed peas, vegetables OR salmon, salad, peas, green beans   Snack (evening) occasionally fruit (watermelon, canteloup) OR PB and crackers but generally avoids as they cause heartburn    Beverage(s) water, sugar free  coolade     Exercise   Exercise Type ADL's   How many days per week to you exercise? 0   How many minutes per day do you exercise? 0   Total minutes per week of exercise 0     Patient Education   Previous Diabetes Education No   Nutrition management  Role of diet in the treatment of diabetes and the relationship between the three main macronutrients and blood glucose level;Food label reading, portion sizes and measuring food.;Meal options for control of blood glucose level and chronic complications.   Physical activity and exercise  Helped patient identify appropriate exercises in relation to his/her diabetes, diabetes complications and other health issue.;Role of exercise on diabetes management, blood pressure control and cardiac health.   Medications Reviewed patients medication for diabetes, action, purpose, timing of dose and side effects.   Monitoring Identified appropriate SMBG and/or A1C goals.;Purpose and frequency of SMBG.   Chronic complications Dental care;Retinopathy and reason for yearly dilated eye exams;Assessed and discussed foot care and prevention of foot problems   Psychosocial adjustment Worked with patient to identify barriers to care and solutions;Role of stress on diabetes;Identified and addressed patients feelings and concerns about diabetes     Individualized Goals (developed by patient)   Nutrition General guidelines for healthy choices and portions discussed   Physical Activity Exercise 3-5 times per week;15 minutes per day   Medications take my medication as prescribed   Monitoring  test my blood glucose as discussed   Reducing Risk examine blood glucose patterns   Health Coping discuss diabetes with (comment)  MD/RD     Post-Education Assessment   Patient understands the diabetes disease and treatment process. Demonstrates understanding / competency   Patient understands incorporating nutritional management into lifestyle. Demonstrates understanding /  competency   Patient undertands incorporating physical activity into lifestyle. Demonstrates understanding / competency   Patient understands using medications safely. Demonstrates understanding / competency   Patient understands monitoring blood glucose, interpreting and using results Demonstrates understanding / competency   Patient understands prevention, detection, and treatment of acute complications. Demonstrates understanding / competency   Patient understands prevention, detection, and treatment of chronic complications. Demonstrates understanding / competency   Patient understands how to develop strategies to address psychosocial issues. Demonstrates understanding / competency   Patient understands how to develop strategies to promote health/change behavior. Demonstrates understanding / competency     Outcomes   Expected Outcomes Other (comment)  demonstrated interest in learning.  His wife understood better than patient.  Some change expected but will need encouragement   Future DMSE PRN  They did not want follow up at this time.   Program Status Completed      Individualized Plan for Diabetes Self-Management Training:   Learning Objective:  Patient will have a greater understanding of diabetes self-management. Patient education plan is to attend individual and/or group sessions per assessed needs and concerns.   Plan:   Patient Instructions  Continue to choose water.  Avoid regular soda and apple juice or other drinks with carbohydrates.  Start to become more active.  Start slow.  15 minutes and increase about 5 minutes each weeks.  Consider a stationary bike.  Discuss your exercise with your MD. Decrease breakfast cereal or have egg beaters and toast and fruit instead. 1/2 your plate should be non starchy vegetables. 1/4 of your plate should be protein. 1/4 of your plate should be fat Consider decreasing your cookie portion. Bake rather than fry most often.    Expected  Outcomes:  Other (comment) (demonstrated interest in learning.  His wife understood better than patient.  Some change expected but will need encouragement)  Education material provided: Meal plan card and Snack sheet, Building a balanced meal, Planning healthy meals  If problems or questions, patient to contact team via:  Phone  Future DSME appointment: PRN (They did not want follow up at this time.)

## 2016-10-09 NOTE — Patient Instructions (Addendum)
Continue to choose water.  Avoid regular soda and apple juice or other drinks with carbohydrates. Start to become more active.  Start slow.  15 minutes and increase about 5 minutes each weeks.  Consider a stationary bike.  Discuss your exercise with your MD. Decrease breakfast cereal or have egg beaters and toast and fruit instead. 1/2 your plate should be non starchy vegetables. 1/4 of your plate should be protein. 1/4 of your plate should be fat Consider decreasing your cookie portion. Bake rather than fry most often.

## 2016-10-17 DIAGNOSIS — R300 Dysuria: Secondary | ICD-10-CM | POA: Diagnosis not present

## 2016-10-27 ENCOUNTER — Other Ambulatory Visit: Payer: Self-pay | Admitting: Internal Medicine

## 2016-10-29 ENCOUNTER — Other Ambulatory Visit: Payer: Medicare Other

## 2016-11-02 ENCOUNTER — Encounter: Payer: Self-pay | Admitting: Endocrinology

## 2016-11-02 ENCOUNTER — Ambulatory Visit (INDEPENDENT_AMBULATORY_CARE_PROVIDER_SITE_OTHER): Payer: Medicare Other | Admitting: Endocrinology

## 2016-11-02 VITALS — BP 138/82 | HR 74 | Ht 72.0 in | Wt 245.4 lb

## 2016-11-02 DIAGNOSIS — E1165 Type 2 diabetes mellitus with hyperglycemia: Secondary | ICD-10-CM

## 2016-11-02 DIAGNOSIS — I10 Essential (primary) hypertension: Secondary | ICD-10-CM | POA: Diagnosis not present

## 2016-11-02 DIAGNOSIS — E782 Mixed hyperlipidemia: Secondary | ICD-10-CM

## 2016-11-02 LAB — LIPID PANEL
Cholesterol: 215 mg/dL — ABNORMAL HIGH (ref 0–200)
HDL: 55.1 mg/dL (ref >39.00)
NonHDL: 159.62
TRIGLYCERIDES: 254 mg/dL — AB (ref 0.0–149.0)
Total CHOL/HDL Ratio: 4
VLDL: 50.8 mg/dL — AB (ref 0.0–40.0)

## 2016-11-02 LAB — COMPREHENSIVE METABOLIC PANEL
ALT: 12 U/L (ref 0–53)
AST: 14 U/L (ref 0–37)
Albumin: 4.3 g/dL (ref 3.5–5.2)
Alkaline Phosphatase: 50 U/L (ref 39–117)
BILIRUBIN TOTAL: 0.6 mg/dL (ref 0.2–1.2)
BUN: 13 mg/dL (ref 6–23)
CALCIUM: 9.9 mg/dL (ref 8.4–10.5)
CHLORIDE: 105 meq/L (ref 96–112)
CO2: 29 mEq/L (ref 19–32)
Creatinine, Ser: 0.81 mg/dL (ref 0.40–1.50)
GFR: 121.37 mL/min (ref >60.00)
Glucose, Bld: 129 mg/dL — ABNORMAL HIGH (ref 70–99)
POTASSIUM: 4.6 meq/L (ref 3.5–5.1)
Sodium: 141 mEq/L (ref 135–145)
Total Protein: 6.8 g/dL (ref 6.0–8.3)

## 2016-11-02 LAB — POCT GLYCOSYLATED HEMOGLOBIN (HGB A1C): Hemoglobin A1C: 6.8

## 2016-11-02 LAB — LDL CHOLESTEROL, DIRECT: Direct LDL: 123 mg/dL

## 2016-11-02 NOTE — Patient Instructions (Addendum)
Walk daily  Check blood sugars on waking up 2-3/7    Also check blood sugars about 2 hours after a meal and do this after different meals by rotation  Recommended blood sugar levels on waking up is 90-130 and about 2 hours after meal is 130-160  Please bring your blood sugar monitor to each visit, thank you  New dose of Janumet  New Rx: Glimeperide at dinner  [place]

## 2016-11-02 NOTE — Progress Notes (Signed)
Patient ID: Clinton Kirby, male   DOB: 09-30-1945, 71 y.o.   MRN: [id number]   Reason for Appointment : Followup of 2 Diabetes  History of Present Illness          Diagnosis: Type 2 diabetes mellitus, date of diagnosis:2004      Prior history: At the time of diagnosis he was having frequent urination and his glucose was about 300 He thinks he was put on metformin and glimepiride at that time He believes his blood sugars were fairly well-controlled for the first few years  However review of his records indicate that his A1c has been around 12% since at least 2011 He was started on Invokana on 12/07/12. Prior to this home blood sugars were about 180-190 fasting.   His lab glucose readings were usually over 200  Recent history:   He has not been seen in follow-up for over 2 years  Non-insulin hypoglycemic drugs the patient is taking are: Invokana 300 mg daily , was on glipizide ER 2.5 mg daily, Janumet XR 100/1000 daily  His A1c is  His 0.8, improved  Current management, blood sugar readings and problems identified:   although his A1c is better his blood sugars are mostly high fasting in averaging about 170  He is  Very sporadic with checking his blood sugar and has only 8 readings for the last month and none after meals except once  Despite encouraging him to be active with exercise is not doing any walking  He did however see the dietitian a few weeks ago and just starting to make some changes in his meal planning, previously had a lot of fast food and regular soft drinks and his diet  Still has relatively high carbohydrate breakfast  Weight has not come down  He previously was on glipizide and not clear why he stopped this  Still continues to be on a relatively low dose of metformin in the Janumet, no reported history of intolerance to metformin     Glucose monitoring:  sporadic         Glucometer:   One Touch Blood Glucose readings from download: FASTING  range 139-204, nonfasting 145, 214  Hypoglycemia frequency: Never.          Self-care: The diet that the patient has been following is: None   Meals: 3 meals per day.  for breakfast has  eggs, potatoes and bread,  but sometimes cereal.   Physical activity: exercise: minimal walking       Dietician visit: Most recent: 09/2016            Side effects from medications have been: none   Wt Readings from Last 3 Encounters:  11/02/16 245 lb 6.4 oz (111.3 kg)  10/09/16 244 lb (110.7 kg)  09/03/16 243 lb 6 oz (110.4 kg)     Lab Results  Component Value Date   HGBA1C 6.8 11/02/2016   HGBA1C 7.5 07/22/2016   HGBA1C 8.3 (H) 05/01/2016   Lab Results  Component Value Date   MICROALBUR 1.0 01/08/2014   LDLCALC 49 01/31/2016   CREATININE 0.81 11/02/2016     Allergies as of 11/02/2016   No Known Allergies     Medication List       Accurate as of 11/02/16 11:59 PM. Always use your most recent med list.          aspirin 81 MG tablet Take 81 mg by mouth daily.   atorvastatin 40 MG tablet Commonly known  as:  LIPITOR TAKE ONE TABLET BY MOUTH ONCE DAILY   carvedilol 25 MG tablet Commonly known as:  COREG Take 1 tablet (25 mg total) by mouth 2 (two) times daily.   clotrimazole-betamethasone cream Commonly known as:  LOTRISONE Apply topically 2 (two) times daily.   gabapentin 300 MG capsule Commonly known as:  NEURONTIN Take 1 capsule (300 mg total) by mouth at bedtime.   glucose blood test strip Commonly known as:  ONETOUCH VERIO 1 each by Other route 2 (two) times daily. Use to check blood sugars twice a day Dx E11.9   INVOKANA 300 MG Tabs tablet Generic drug:  canagliflozin TAKE 1 TABLET BY MOUTH ONCE DAILY .  PATIENT  NEEDS  OFFICE  VISITS  BEFORE  REFILLS  WILL  BE  GIVEN   JANUMET XR [phone] MG Tb24 Generic drug:  SitaGLIPtin-MetFORMIN HCl Take by mouth daily.   losartan 50 MG tablet Commonly known as:  COZAAR Take 1 tablet (50 mg total) by mouth daily.     ONETOUCH DELICA LANCETS FINE Misc Use to help check blood sugars daily Dx E11.9   tamsulosin 0.4 MG Caps capsule Commonly known as:  FLOMAX Take 1 capsule (0.4 mg total) by mouth daily after supper.   Vitamin D3 1000 units Caps Take 1 capsule by mouth.            Discharge Care Instructions        Start     Ordered   11/02/16 1452  LDL cholesterol, direct     11/02/16 1452   11/02/16 0000  Fructosamine     11/02/16 1450   11/02/16 0000  POCT glycosylated hemoglobin (Hb A1C)     11/02/16 1534   09/01/16 0000  Comprehensive metabolic panel     13/24/40 1149   09/01/16 0000  Lipid panel     09/01/16 1149      Allergies: No Known Allergies  Past Medical History:  Diagnosis Date  . Balanitis   . CAD (coronary artery disease)   . Depression   . Diabetes mellitus type II   . ED (erectile dysfunction)   . HTN (hypertension)   . Hyperlipidemia   . Old MI (myocardial infarction) 12/07/2013  . Prostate cancer [facility])     Past Surgical History:  Procedure Laterality Date  . CORONARY ARTERY BYPASS GRAFT  10/2015  . PROSTATE BIOPSY    . UMBILICAL HERNIA REPAIR      Family History  Problem Relation Age of Onset  . Diabetes Mother   . Cancer Father        ?  . Diabetes Other     Social History:  reports that he has never smoked. He has never used smokeless tobacco. He reports that he does not drink alcohol or use drugs.    Review of Systems       Lipids:  Needs follow-up, taking Lipitor 40 mg   Lab Results  Component Value Date   CHOL 215 (H) 11/02/2016   HDL 55.10 11/02/2016   LDLCALC 49 01/31/2016   LDLDIRECT 123.0 11/02/2016   TRIG 254.0 (H) 11/02/2016   CHOLHDL 4 11/02/2016           HYPERTENSION: Well controlled with 50mg  losartan and Coreg      NEUROPATHY: Feet burn and sting, He was previously given Lyrica, but for lower cost she was given gabapentin on his last visit but he does not think this helps him with overnight symptoms However he  refuses to increase the dose    LABS:   Office Visit on 11/02/2016  Component Date Value Ref Range Status  . Sodium 11/02/2016 141  135 - 145 mEq/L Final  . Potassium 11/02/2016 4.6  3.5 - 5.1 mEq/L Final  . Chloride 11/02/2016 105  96 - 112 mEq/L Final  . CO2 11/02/2016 29  19 - 32 mEq/L Final  . Glucose, Bld 11/02/2016 129* 70 - 99 mg/dL Final  . BUN 11/02/2016 13  6 - 23 mg/dL Final  . Creatinine, Ser 11/02/2016 0.81  0.40 - 1.50 mg/dL Final  . Total Bilirubin 11/02/2016 0.6  0.2 - 1.2 mg/dL Final  . Alkaline Phosphatase 11/02/2016 50  39 - 117 U/L Final  . AST 11/02/2016 14  0 - 37 U/L Final  . ALT 11/02/2016 12  0 - 53 U/L Final  . Total Protein 11/02/2016 6.8  6.0 - 8.3 g/dL Final  . Albumin 11/02/2016 4.3  3.5 - 5.2 g/dL Final  . Calcium 11/02/2016 9.9  8.4 - 10.5 mg/dL Final  . GFR 11/02/2016 121.37  >60.00 mL/min Final  . Cholesterol 11/02/2016 215* 0 - 200 mg/dL Final   ATP III Classification       Desirable:  < 200 mg/dL               Borderline High:  200 - 239 mg/dL          High:  > = 240 mg/dL  . Triglycerides 11/02/2016 254.0* 0.0 - 149.0 mg/dL Final   Normal:  <150 mg/dLBorderline High:  150 - 199 mg/dL  . HDL 11/02/2016 55.10  >39.00 mg/dL Final  . VLDL 11/02/2016 50.8* 0.0 - 40.0 mg/dL Final  . Total CHOL/HDL Ratio 11/02/2016 4   Final                  Men          Women1/2 Average Risk     3.4          3.3Average Risk          5.0          4.42X Average Risk          9.6          7.13X Average Risk          15.0          11.0                      . NonHDL 11/02/2016 159.62   Final   NOTE:  Non-HDL goal should be 30 mg/dL higher than patient's LDL goal (i.e. LDL goal of < 70 mg/dL, would have non-HDL goal of < 100 mg/dL)  . Fructosamine 11/02/2016 279  0 - 285 umol/L Final   Comment: Published reference interval for apparently healthy subjects between age 44 and 68 is 39 - 285 umol/L and in a poorly controlled diabetic population is 228 - 563 umol/L with a mean  of 396 umol/L.   [name] Hemoglobin A1C 11/02/2016 6.8   Final  . Direct LDL 11/02/2016 123.0  mg/dL Final   Optimal:  <100 mg/dLNear or Above Optimal:  100-129 mg/dLBorderline High:  130-159 mg/dLHigh:  160-189 mg/dLVery High:  >190 mg/dL    Physical Examination:  BP 138/82   Pulse 74   Ht 6' (1.829 m)   Wt 245 lb 6.4 oz (111.3 kg)   SpO2 98%   BMI 33.28 kg/m  No Ankle edema present   ASSESSMENT/PLAN  Diabetes type 2, uncontrolled   See history of present illness for detailed discussion of current diabetes management, blood sugar patterns and problems identified  He has had persistently high A1c which is now relatively better, however even with her A1c of 6.8 his blood sugars averaging 170 at home Currently not on maximum dose of metformin and his Janumet Also previously was benefiting from glipizide which he has stopped He still needs to make significant changes in his diet with meal planning lower carbohydrate and lower fat intake Does need weight loss and this was discussed   Recommendations:  Check blood sugars more consistently and more after meals   Discussed cutting back on high-fat and high carbohydrate items in his meals including at breakfast, his wife was present today and all questions answered  Start Amaryl 1 mg at dinnertime  He requested a new foot doctor and given him the name of [place] Next prescription for Janumet XR will be 50/1000, 2 tablets daily and explained that this only increases the metformin dose  NEUROPATHY: He is on gabapentin but he does not report any benefit and does not want to increase the dose Encouraged him to have regular foot exams   LIPIDS: Will need follow-up and adjust Lipitor accordingly    Patient Instructions  Walk daily  Check blood sugars on waking up 2-3/7    Also check blood sugars about 2 hours after a meal and do this after different meals by rotation  Recommended blood sugar levels on waking up is  90-130 and about 2 hours after meal is 130-160  Please bring your blood sugar monitor to each visit, thank you  New dose of Janumet  New Rx: Glimeperide at dinner  [place] time on subjects discussed in assessment and plan sections is over 50% of today's 25 minute visit  [place] Krotz 11/03/2016, 7:59 AM    ADDENDUM: His lipids are not controlled, will need to check that he is taking his Lipitor regularly

## 2016-11-03 LAB — FRUCTOSAMINE: FRUCTOSAMINE: 279 umol/L (ref 0–285)

## 2016-11-03 MED ORDER — GLIMEPIRIDE 1 MG PO TABS: 1.0000 mg | ORAL_TABLET | Freq: Every day | ORAL | 3 refills | Status: DC

## 2016-11-03 MED ORDER — SITAGLIP PHOS-METFORMIN HCL ER 50-1000 MG PO TB24: ORAL_TABLET | ORAL | 2 refills | Status: DC

## 2016-11-11 ENCOUNTER — Ambulatory Visit: Payer: Medicare Other | Admitting: Cardiology

## 2016-11-18 DIAGNOSIS — R9431 Abnormal electrocardiogram [ECG] [EKG]: Secondary | ICD-10-CM | POA: Diagnosis not present

## 2016-11-18 DIAGNOSIS — R531 Weakness: Secondary | ICD-10-CM | POA: Diagnosis not present

## 2016-11-18 DIAGNOSIS — R42 Dizziness and giddiness: Secondary | ICD-10-CM | POA: Diagnosis not present

## 2016-11-23 ENCOUNTER — Ambulatory Visit (INDEPENDENT_AMBULATORY_CARE_PROVIDER_SITE_OTHER): Payer: Medicare Other | Admitting: Nurse Practitioner

## 2016-11-23 ENCOUNTER — Other Ambulatory Visit (INDEPENDENT_AMBULATORY_CARE_PROVIDER_SITE_OTHER): Payer: Medicare Other

## 2016-11-23 ENCOUNTER — Encounter: Payer: Self-pay | Admitting: Nurse Practitioner

## 2016-11-23 VITALS — BP 132/72 | HR 83 | Temp 98.1°F | Resp 18 | Ht 72.0 in | Wt 245.0 lb

## 2016-11-23 DIAGNOSIS — IMO0002 Reserved for concepts with insufficient information to code with codable children: Secondary | ICD-10-CM

## 2016-11-23 DIAGNOSIS — E1165 Type 2 diabetes mellitus with hyperglycemia: Secondary | ICD-10-CM

## 2016-11-23 DIAGNOSIS — R42 Dizziness and giddiness: Secondary | ICD-10-CM | POA: Diagnosis not present

## 2016-11-23 LAB — BASIC METABOLIC PANEL
BUN: 20 mg/dL (ref 6–23)
CALCIUM: 9.8 mg/dL (ref 8.4–10.5)
CHLORIDE: 101 meq/L (ref 96–112)
CO2: 25 mEq/L (ref 19–32)
CREATININE: 0.82 mg/dL (ref 0.40–1.50)
GFR: 119.65 mL/min (ref >60.00)
Glucose, Bld: 265 mg/dL — ABNORMAL HIGH (ref 70–99)
Potassium: 4.6 mEq/L (ref 3.5–5.1)
Sodium: 137 mEq/L (ref 135–145)

## 2016-11-23 LAB — HEMOGLOBIN A1C: HEMOGLOBIN A1C: 7 % — AB (ref 4.6–6.5)

## 2016-11-23 NOTE — Patient Instructions (Addendum)
Please go downstairs for blood work.  Your physical examination is reassuring for vertigo. I'd like you to continue the meclizine at home. Remember to stay hydrated. I've given you instructions for vertigo maneuvers I'd like for you to try.  If you experience confusion, sudden weakness on one side of your body, or trouble with your vision or speech, please call 9-1-1.  Let us know if your symptoms have not improved by next week.  Thanks for letting me take care of you today :)  Vertigo Vertigo means that you feel like you are moving when you are not. Vertigo can also make you feel like things around you are moving when they are not. This feeling can come and go at any time. Vertigo often goes away on its own. Follow these instructions at home:  Avoid making fast movements.  Avoid driving.  Avoid using heavy machinery.  Avoid doing any task or activity that might cause danger to you or other people if you would have a vertigo attack while you are doing it.  Sit down right away if you feel dizzy or have trouble with your balance.  Take over-the-counter and prescription medicines only as told by your doctor.  Follow instructions from your doctor about which positions or movements you should avoid.  Drink enough fluid to keep your pee (urine) clear or pale yellow.  Keep all follow-up visits as told by your doctor. This is important. Contact a doctor if:  Medicine does not help your vertigo.  You have a fever.  Your problems get worse or you have new symptoms.  Your family or friends see changes in your behavior.  You feel sick to your stomach (nauseous) or you throw up (vomit).  You have a "pins and needles" feeling or you are numb in part of your body. Get help right away if:  You have trouble moving or talking.  You are always dizzy.  You pass out (faint).  You get very bad headaches.  You feel weak or have trouble using your hands, arms, or legs.  You have  changes in your hearing.  You have changes in your seeing (vision).  You get a stiff neck.  Bright light starts to bother you. This information is not intended to replace advice given to you by your health care provider. Make sure you discuss any questions you have with your health care provider. Document Released: 11/05/2007 Document Revised: 07/04/2015 Document Reviewed: 05/21/2014 Elsevier Interactive Patient Education  [employer].

## 2016-11-23 NOTE — Progress Notes (Signed)
Subjective:    Patient ID: Clinton Kirby, male    DOB: June 08, 1945, 71 y.o.   MRN: [id number]  HPI  Clinton Kirby is a 71 yo male who presents today for a hospital follow up. He was seen at [facility] Emergency Department on 10/10 for dizziness and generalized weakness that began that morning. He had a CBC/diff, CMET, EKG, which were unremarkable. He had improvement with meclizine during his ED visit. He was diagnosed with vertigo and instructed to continue meclizine at home. His dizziness has continued since his ED visit. He does not feel dizzy until he stands up or moves his head. He did fall at home last week when the dizziness began but did not get hurt. He denies any weakness, headache, fatigue, confusion, ear fullness, facial pain or pressure, visual disturbances, speech difficulty, chest pain, shortness of breath, nausea, vomiting. He's continued to take meclizine at home which provides some temporary relief.  Review of Systems See HPI  Past Medical History:  Diagnosis Date  . Balanitis   . CAD (coronary artery disease)   . Depression   . Diabetes mellitus type II   . ED (erectile dysfunction)   . HTN (hypertension)   . Hyperlipidemia   . Old MI (myocardial infarction) 12/07/2013  . Prostate cancer [facility])      Social History   Social History  . Marital status: Married    Spouse name: N/A  . Number of children: 1  . Years of education: N/A   Occupational History  . truck driver-self employed    Social History Main Topics  . Smoking status: Never Smoker  . Smokeless tobacco: Never Used  . Alcohol use No  . Drug use: No  . Sexual activity: Yes   Other Topics Concern  . Not on file   Social History Narrative  . No narrative on file    Past Surgical History:  Procedure Laterality Date  . CORONARY ARTERY BYPASS GRAFT  10/2015  . PROSTATE BIOPSY    . UMBILICAL HERNIA REPAIR      Family History  Problem Relation Age of Onset  . Diabetes Mother   . Cancer Father         ?  . Diabetes Other     No Known Allergies  Current Outpatient Prescriptions on File Prior to Visit  Medication Sig Dispense Refill  . aspirin 81 MG tablet Take 81 mg by mouth daily.    [name] atorvastatin (LIPITOR) 40 MG tablet TAKE ONE TABLET BY MOUTH ONCE DAILY 90 tablet 1  . carvedilol (COREG) 25 MG tablet Take 1 tablet (25 mg total) by mouth 2 (two) times daily. 180 tablet 3  . Cholecalciferol (VITAMIN D3) 1000 units CAPS Take 1 capsule by mouth.    . clotrimazole-betamethasone (LOTRISONE) cream Apply topically 2 (two) times daily. 45 g 1  . gabapentin (NEURONTIN) 300 MG capsule Take 1 capsule (300 mg total) by mouth at bedtime. 30 capsule 3  . glucose blood (ONETOUCH VERIO) test strip 1 each by Other route 2 (two) times daily. Use to check blood sugars twice a day Dx E11.9 100 each 3  . INVOKANA 300 MG TABS tablet TAKE 1 TABLET BY MOUTH ONCE DAILY .  PATIENT  NEEDS  OFFICE  VISITS  BEFORE  REFILLS  WILL  BE  GIVEN 30 tablet 11  . losartan (COZAAR) 50 MG tablet Take 1 tablet (50 mg total) by mouth daily. 90 tablet 3  . ONETOUCH DELICA LANCETS FINE MISC Use  to help check blood sugars daily Dx E11.9 100 each 3  . SitaGLIPtin-MetFORMIN HCl 50-1000 MG TB24 2 tablets daily at dinner 60 tablet 2  . tamsulosin (FLOMAX) 0.4 MG CAPS capsule Take 1 capsule (0.4 mg total) by mouth daily after supper. 30 capsule 5   Current Facility-Administered Medications on File Prior to Visit  Medication Dose Route Frequency Provider Last Rate Last Dose  . aspirin chewable tablet 81 mg  81 mg Oral Once [place], Peter M, MD        BP 132/72 (BP Location: Right Arm, Cuff Size: Large)   Pulse 83   Temp 98.1 F (36.7 C) (Oral)   Resp 18   Ht 6' (1.829 m)   Wt 245 lb (111.1 kg)   SpO2 97%   BMI 33.23 kg/m       Objective:   Physical Exam  Constitutional: He is oriented to person, place, and time. He appears well-developed and well-nourished.  Orthostatic vitals negative.  HENT:  Head:  Normocephalic and atraumatic.  Right Ear: Hearing and external ear normal. No drainage or tenderness. No decreased hearing is noted.  Left Ear: Hearing and external ear normal. No drainage or tenderness. No decreased hearing is noted.  Sclerotic TMs. Nonobstructive wax right ear.  Eyes: Pupils are equal, round, and reactive to light. EOM are normal.  No nystagmus.  Cardiovascular: Normal rate, regular rhythm and normal heart sounds.   Pulmonary/Chest: Effort normal and breath sounds normal.  Musculoskeletal: Normal range of motion.  Neurological: He is alert and oriented to person, place, and time. He has normal strength. He displays normal reflexes. No cranial nerve deficit or sensory deficit.  Skin: Skin is warm and dry.  Psychiatric: He has a normal mood and affect. Judgment and thought content normal.       Assessment & Plan:  Vertigo-History and physical examination consistent with vertigo. Will re-check BMET for electrolyte disturbances. Patient will continue meclizine at home and begin dix-hallpike maneuvers. ER and return precautions given.

## 2016-11-24 ENCOUNTER — Telehealth: Payer: Self-pay | Admitting: Cardiology

## 2016-11-24 ENCOUNTER — Telehealth: Payer: Self-pay | Admitting: Internal Medicine

## 2016-11-24 NOTE — Telephone Encounter (Signed)
Please advise on recent lab results.

## 2016-11-24 NOTE — Telephone Encounter (Signed)
Returned call to [facility] with [facility] left message on personal voice mail patient has a diagnosis of chronic systolic congestive heart failure.

## 2016-11-24 NOTE — Telephone Encounter (Signed)
Pt was seen by Clinton Kirby yesterday and called to check on his lab results. Please advise.

## 2016-11-24 NOTE — Telephone Encounter (Signed)
She needs too know if the pt have a diagnosis of Congested Heart Failure.If not there,please just leave it on her voicemail.

## 2016-11-25 NOTE — Telephone Encounter (Signed)
See results note. 

## 2016-11-26 DIAGNOSIS — E1140 Type 2 diabetes mellitus with diabetic neuropathy, unspecified: Secondary | ICD-10-CM | POA: Diagnosis not present

## 2016-11-26 DIAGNOSIS — M7742 Metatarsalgia, left foot: Secondary | ICD-10-CM | POA: Diagnosis not present

## 2016-11-26 DIAGNOSIS — R200 Anesthesia of skin: Secondary | ICD-10-CM | POA: Diagnosis not present

## 2016-11-26 DIAGNOSIS — M2041 Other hammer toe(s) (acquired), right foot: Secondary | ICD-10-CM | POA: Diagnosis not present

## 2016-11-26 DIAGNOSIS — M2042 Other hammer toe(s) (acquired), left foot: Secondary | ICD-10-CM | POA: Diagnosis not present

## 2016-12-10 ENCOUNTER — Telehealth: Payer: Self-pay

## 2016-12-10 NOTE — Telephone Encounter (Signed)
Rec'd fax from [place] for Meclizine 25mg  tablets. This was prescribed when she went to ED. Please advise

## 2016-12-11 DIAGNOSIS — J029 Acute pharyngitis, unspecified: Secondary | ICD-10-CM | POA: Diagnosis not present

## 2016-12-11 DIAGNOSIS — J209 Acute bronchitis, unspecified: Secondary | ICD-10-CM | POA: Diagnosis not present

## 2016-12-11 MED ORDER — MECLIZINE HCL 25 MG PO TABS: 25.0000 mg | ORAL_TABLET | Freq: Three times a day (TID) | ORAL | 1 refills | Status: DC | PRN

## 2016-12-11 NOTE — Telephone Encounter (Signed)
Ok

## 2016-12-12 DIAGNOSIS — R5383 Other fatigue: Secondary | ICD-10-CM | POA: Diagnosis not present

## 2016-12-12 DIAGNOSIS — R0981 Nasal congestion: Secondary | ICD-10-CM | POA: Diagnosis not present

## 2016-12-12 DIAGNOSIS — E1140 Type 2 diabetes mellitus with diabetic neuropathy, unspecified: Secondary | ICD-10-CM | POA: Diagnosis not present

## 2016-12-12 DIAGNOSIS — I10 Essential (primary) hypertension: Secondary | ICD-10-CM | POA: Diagnosis not present

## 2016-12-12 DIAGNOSIS — R062 Wheezing: Secondary | ICD-10-CM | POA: Diagnosis not present

## 2016-12-12 DIAGNOSIS — E7800 Pure hypercholesterolemia, unspecified: Secondary | ICD-10-CM | POA: Diagnosis not present

## 2016-12-12 DIAGNOSIS — Z79899 Other long term (current) drug therapy: Secondary | ICD-10-CM | POA: Diagnosis not present

## 2016-12-12 DIAGNOSIS — Z9889 Other specified postprocedural states: Secondary | ICD-10-CM | POA: Diagnosis not present

## 2016-12-12 DIAGNOSIS — M542 Cervicalgia: Secondary | ICD-10-CM | POA: Diagnosis not present

## 2016-12-12 DIAGNOSIS — R05 Cough: Secondary | ICD-10-CM | POA: Diagnosis not present

## 2016-12-12 DIAGNOSIS — J029 Acute pharyngitis, unspecified: Secondary | ICD-10-CM | POA: Diagnosis not present

## 2016-12-12 DIAGNOSIS — Z7984 Long term (current) use of oral hypoglycemic drugs: Secondary | ICD-10-CM | POA: Diagnosis not present

## 2016-12-12 DIAGNOSIS — Z7982 Long term (current) use of aspirin: Secondary | ICD-10-CM | POA: Diagnosis not present

## 2016-12-12 DIAGNOSIS — R0781 Pleurodynia: Secondary | ICD-10-CM | POA: Diagnosis not present

## 2016-12-29 ENCOUNTER — Other Ambulatory Visit: Payer: Medicare Other

## 2017-01-05 ENCOUNTER — Ambulatory Visit: Payer: Medicare Other | Admitting: Endocrinology

## 2017-01-08 ENCOUNTER — Other Ambulatory Visit: Payer: Medicare Other

## 2017-01-13 ENCOUNTER — Ambulatory Visit: Payer: Medicare Other | Admitting: Endocrinology

## 2017-01-15 DIAGNOSIS — C61 Malignant neoplasm of prostate: Secondary | ICD-10-CM | POA: Diagnosis not present

## 2017-02-17 ENCOUNTER — Other Ambulatory Visit: Payer: Self-pay | Admitting: Internal Medicine

## 2017-02-17 ENCOUNTER — Other Ambulatory Visit: Payer: Medicare Other

## 2017-02-18 ENCOUNTER — Ambulatory Visit: Payer: Medicare Other | Admitting: Endocrinology

## 2017-03-01 ENCOUNTER — Ambulatory Visit (INDEPENDENT_AMBULATORY_CARE_PROVIDER_SITE_OTHER): Payer: Medicare Other | Admitting: Internal Medicine

## 2017-03-01 ENCOUNTER — Telehealth: Payer: Self-pay | Admitting: Internal Medicine

## 2017-03-01 ENCOUNTER — Encounter: Payer: Self-pay | Admitting: Internal Medicine

## 2017-03-01 DIAGNOSIS — Z951 Presence of aortocoronary bypass graft: Secondary | ICD-10-CM

## 2017-03-01 DIAGNOSIS — E1165 Type 2 diabetes mellitus with hyperglycemia: Secondary | ICD-10-CM | POA: Diagnosis not present

## 2017-03-01 DIAGNOSIS — C61 Malignant neoplasm of prostate: Secondary | ICD-10-CM

## 2017-03-01 DIAGNOSIS — E1149 Type 2 diabetes mellitus with other diabetic neurological complication: Secondary | ICD-10-CM

## 2017-03-01 DIAGNOSIS — E7800 Pure hypercholesterolemia, unspecified: Secondary | ICD-10-CM

## 2017-03-01 DIAGNOSIS — E1142 Type 2 diabetes mellitus with diabetic polyneuropathy: Secondary | ICD-10-CM | POA: Diagnosis not present

## 2017-03-01 MED ORDER — VITAMIN D3 50 MCG (2000 UT) PO CAPS: 2000.0000 [IU] | ORAL_CAPSULE | Freq: Every day | ORAL | 3 refills | Status: DC

## 2017-03-01 MED ORDER — B COMPLEX PO TABS: 1.0000 | ORAL_TABLET | Freq: Every day | ORAL | 3 refills | Status: DC

## 2017-03-01 MED ORDER — SITAGLIP PHOS-METFORMIN HCL ER 50-1000 MG PO TB24: ORAL_TABLET | ORAL | 3 refills | Status: DC

## 2017-03-01 NOTE — Assessment & Plan Note (Signed)
Janumet XR,   Lipitor,  ASA

## 2017-03-01 NOTE — Telephone Encounter (Signed)
I don't have a lancet device for this brand - sorry. He can try to prick with lancets as we discussed. Thx

## 2017-03-01 NOTE — Assessment & Plan Note (Signed)
Lupron, XRT seeds 

## 2017-03-01 NOTE — Assessment & Plan Note (Signed)
lipitor

## 2017-03-01 NOTE — Telephone Encounter (Signed)
fyi

## 2017-03-01 NOTE — Telephone Encounter (Signed)
Copied from [place]. Topic: Quick Communication - See Telephone Encounter >> Mar 01, 2017  3:16 PM Vernona Rieger wrote: CRM for notification. See Telephone encounter for:   03/01/17.  Dr Henderson [place] asked him to call back and let him know that he uses one touch verio flex & relion

## 2017-03-01 NOTE — Assessment & Plan Note (Signed)
Janumet

## 2017-03-01 NOTE — Assessment & Plan Note (Addendum)
Lipitor and ASA; Coreg, Losartan 

## 2017-03-01 NOTE — Progress Notes (Signed)
Subjective:  Patient ID: Clinton Kirby, male    DOB: 1945/08/13  Age: 72 y.o. MRN: [id number]  CC: No chief complaint on file.   HPI Cason R Schweitzer presents for DM, HTN, dyslipidemia f/u C/o fatigue since XRT (finished in July 2018)  Outpatient Medications Prior to Visit  Medication Sig Dispense Refill  . aspirin 81 MG tablet Take 81 mg by mouth daily.    [name] atorvastatin (LIPITOR) 40 MG tablet TAKE ONE TABLET BY MOUTH ONCE DAILY 90 tablet 1  . carvedilol (COREG) 25 MG tablet Take 1 tablet (25 mg total) by mouth 2 (two) times daily. 180 tablet 3  . Cholecalciferol (VITAMIN D3) 1000 units CAPS Take 1 capsule by mouth.    . clotrimazole-betamethasone (LOTRISONE) cream Apply topically 2 (two) times daily. 45 g 1  . gabapentin (NEURONTIN) 300 MG capsule Take 1 capsule (300 mg total) by mouth at bedtime. 30 capsule 3  . glucose blood (ONETOUCH VERIO) test strip 1 each by Other route 2 (two) times daily. Use to check blood sugars twice a day Dx E11.9 100 each 3  . losartan (COZAAR) 50 MG tablet Take 1 tablet (50 mg total) by mouth daily. 90 tablet 3  . meclizine (ANTIVERT) 25 MG tablet TAKE 1 TABLET BY MOUTH THREE TIMES DAILY AS NEEDED FOR  DIZZINESS 30 tablet 1  . ONETOUCH DELICA LANCETS FINE MISC Use to help check blood sugars daily Dx E11.9 100 each 3  . SitaGLIPtin-MetFORMIN HCl 50-1000 MG TB24 2 tablets daily at dinner 60 tablet 2  . tamsulosin (FLOMAX) 0.4 MG CAPS capsule Take 1 capsule (0.4 mg total) by mouth daily after supper. 30 capsule 5  . INVOKANA 300 MG TABS tablet TAKE 1 TABLET BY MOUTH ONCE DAILY .  PATIENT  NEEDS  OFFICE  VISITS  BEFORE  REFILLS  WILL  BE  GIVEN 30 tablet 11   Facility-Administered Medications Prior to Visit  Medication Dose Route Frequency Provider Last Rate Last Dose  . aspirin chewable tablet 81 mg  81 mg Oral Once Martinique, Peter M, MD        ROS Review of Systems  Constitutional: Positive for fatigue. Negative for appetite change and unexpected  weight change.  HENT: Negative for congestion, nosebleeds, sneezing, sore throat and trouble swallowing.   Eyes: Negative for itching and visual disturbance.  Respiratory: Negative for cough.   Cardiovascular: Negative for chest pain, palpitations and leg swelling.  Gastrointestinal: Negative for abdominal distention, blood in stool, diarrhea and nausea.  Genitourinary: Negative for frequency and hematuria.  Musculoskeletal: Negative for back pain, gait problem, joint swelling and neck pain.  Skin: Negative for rash.  Neurological: Negative for dizziness, tremors, speech difficulty and weakness.  Psychiatric/Behavioral: Negative for agitation, dysphoric mood, sleep disturbance and suicidal ideas. The patient is not nervous/anxious.     Objective:  BP 128/72 (BP Location: Left Arm, Patient Position: Sitting, Cuff Size: Large)   Pulse 75   Temp 98.8 F (37.1 C) (Oral)   Ht 6' (1.829 m)   Wt 257 lb (116.6 kg)   SpO2 98%   BMI 34.86 kg/m   BP Readings from Last 3 Encounters:  03/01/17 128/72  11/23/16 132/72  11/02/16 138/82    Wt Readings from Last 3 Encounters:  03/01/17 257 lb (116.6 kg)  11/23/16 245 lb (111.1 kg)  11/02/16 245 lb 6.4 oz (111.3 kg)    Physical Exam  Constitutional: He is oriented to person, place, and time. He appears well-developed. No  distress.  NAD  HENT:  Mouth/Throat: Oropharynx is clear and moist.  Eyes: Conjunctivae are normal. Pupils are equal, round, and reactive to light.  Neck: Normal range of motion. No JVD present. No thyromegaly present.  Cardiovascular: Normal rate, regular rhythm, normal heart sounds and intact distal pulses. Exam reveals no gallop and no friction rub.  No murmur heard. Pulmonary/Chest: Effort normal and breath sounds normal. No respiratory distress. He has no wheezes. He has no rales. He exhibits no tenderness.  Abdominal: Soft. Bowel sounds are normal. He exhibits no distension and no mass. There is no tenderness. There  is no rebound and no guarding.  Musculoskeletal: Normal range of motion. He exhibits no edema or tenderness.  Lymphadenopathy:    He has no cervical adenopathy.  Neurological: He is alert and oriented to person, place, and time. He has normal reflexes. No cranial nerve deficit. He exhibits normal muscle tone. He displays a negative Romberg sign. Coordination and gait normal.  Skin: Skin is warm and dry. No rash noted.  Psychiatric: He has a normal mood and affect. His behavior is normal. Judgment and thought content normal.  obese ataxic  Lab Results  Component Value Date   WBC 7.6 11/09/2014   HGB 16.7 11/09/2014   HCT 49.5 11/09/2014   PLT 303.0 11/09/2014   GLUCOSE 265 (H) 11/23/2016   CHOL 215 (H) 11/02/2016   TRIG 254.0 (H) 11/02/2016   HDL 55.10 11/02/2016   LDLDIRECT 123.0 11/02/2016   LDLCALC 49 01/31/2016   ALT 12 11/02/2016   AST 14 11/02/2016   NA 137 11/23/2016   K 4.6 11/23/2016   CL 101 11/23/2016   CREATININE 0.82 11/23/2016   BUN 20 11/23/2016   CO2 25 11/23/2016   TSH 0.72 11/09/2014   PSA 41.85 (H) 06/25/2015   HGBA1C 7.0 (H) 11/23/2016   MICROALBUR 1.0 01/08/2014    No results found.  Assessment & Plan:   There are no diagnoses linked to this encounter. I have discontinued [place]. I am also having him maintain his glucose blood, ONETOUCH DELICA LANCETS FINE, losartan, Vitamin D3, clotrimazole-betamethasone, tamsulosin, atorvastatin, gabapentin, aspirin, carvedilol, SitaGLIPtin-MetFORMIN HCl, and meclizine. We will continue to administer aspirin.  No orders of the defined types were placed in this encounter.    Follow-up: No Follow-up on file.  Walker Kehr, MD

## 2017-03-14 ENCOUNTER — Other Ambulatory Visit: Payer: Self-pay | Admitting: Internal Medicine

## 2017-03-18 ENCOUNTER — Other Ambulatory Visit: Payer: Medicare Other

## 2017-03-21 NOTE — Progress Notes (Signed)
Cardiology Office Note    Date:  03/24/2017   ID:  Clinton Kirby, DOB 04/03/1945, MRN [id number]  PCP:  [name], MD  Cardiologist:  Melessia Kaus Martin[place], MD    History of Present Illness:  Clinton Kirby is a 72 y.o. male with known history of coronary disease and  poorly controlled diabetes mellitus type 2 as well as hyperlipidemia, hypertension and peripheral neuropathy.  He is status post inferior myocardial infarction in February of 2004 while in [place]. This was treated with a drug-eluting stent to the right coronary. In 2005 he had some abnormality on a stress test and underwent repeat cardiac catheterization. This showed the stent to be widely patent and he had no other significant disease. Stress test here was in July 2014 showed a fixed inferior wall scar and mild LV dysfunction.   He is  s/p CABG in [facility] on 11/11/15. H was admitted  with chest pain. He ruled out for MI.  He  had an abnormal Myoview showing inferior and apical scar with ischemia in the anterior wall and apex. EF 28%.  He underwent cardiac cath 11/06/15. His cath showed a patent RCA stent but an occluded LAD and 70% stenosis in the OM1 and OM2.  He underwent CABG x 1 with LIMA-LAD 11/11/15 by Dr [name]. The OMs were felt to be too small for bypass. Post OP echo 11/13/15 showed his EF to be 40%. Carotid dopplers showed no obstructive disease. It was noted on cardiac cath that his right innominate artery was very tortuous.   Cath films reviewed personally by me:  11/07/15. This showed occlusion of the proximal LAD with some collateral. There was a large ramus vessel that bifurcated in the mid vessel. There was 40-50% disease at the bifurcation. The first OM was a moderate sized vessel with 80-90% stenosis. The second OM was smaller with a 70% stenosis. The RCA was patent in the mid vessel at site of prior stent. There was diffuse 40% disease in the proximal vessel. It is surprising that the OMs  were too small for bypass. If he has refractory angina could be considered for PCI of OM1.  On follow up walks regularly. He denies any chest pain, dyspnea, palpitations, dizziness. Tolerating medication well. Notes voice gets hoarse at times.   Past Medical History:  Diagnosis Date  . Balanitis   . CAD (coronary artery disease)   . Depression   . Diabetes mellitus type II   . ED (erectile dysfunction)   . HTN (hypertension)   . Hyperlipidemia   . Old MI (myocardial infarction) 12/07/2013  . Prostate cancer [facility])     Past Surgical History:  Procedure Laterality Date  . CORONARY ARTERY BYPASS GRAFT  10/2015  . PROSTATE BIOPSY    . UMBILICAL HERNIA REPAIR      Current Medications: Outpatient Medications Prior to Visit  Medication Sig Dispense Refill  . aspirin 81 MG tablet Take 81 mg by mouth daily.    [name] atorvastatin (LIPITOR) 40 MG tablet TAKE ONE TABLET BY MOUTH ONCE DAILY 90 tablet 1  . b complex vitamins tablet Take 1 tablet by mouth daily. 100 tablet 3  . carvedilol (COREG) 25 MG tablet Take 1 tablet (25 mg total) by mouth 2 (two) times daily. 180 tablet 3  . Cholecalciferol (VITAMIN D3) 2000 units capsule Take 1 capsule (2,000 Units total) by mouth daily. 100 capsule 3  . clotrimazole-betamethasone (LOTRISONE) cream Apply topically 2 (two) times  daily. 45 g 1  . gabapentin (NEURONTIN) 300 MG capsule Take 1 capsule (300 mg total) by mouth at bedtime. 30 capsule 3  . glucose blood (ONETOUCH VERIO) test strip 1 each by Other route 2 (two) times daily. Use to check blood sugars twice a day Dx E11.9 100 each 3  . losartan (COZAAR) 50 MG tablet TAKE 1 TABLET BY MOUTH EVERY DAY 90 tablet 0  . meclizine (ANTIVERT) 25 MG tablet TAKE 1 TABLET BY MOUTH THREE TIMES DAILY AS NEEDED FOR  DIZZINESS 30 tablet 1  . ONETOUCH DELICA LANCETS FINE MISC Use to help check blood sugars daily Dx E11.9 100 each 3  . SitaGLIPtin-MetFORMIN HCl 50-1000 MG TB24 2 tablets daily at dinner 180 tablet 3  .  tamsulosin (FLOMAX) 0.4 MG CAPS capsule Take 1 capsule (0.4 mg total) by mouth daily after supper. 30 capsule 5   Facility-Administered Medications Prior to Visit  Medication Dose Route Frequency Provider Last Rate Last Dose  . aspirin chewable tablet 81 mg  81 mg Oral Once Martinique, Saamir Armstrong M, MD         Allergies:   Patient has no known allergies.   Social History   Socioeconomic History  . Marital status: Married    Spouse name: None  . Number of children: 1  . Years of education: None  . Highest education level: None  Social Needs  . Financial resource strain: None  . Food insecurity - worry: None  . Food insecurity - inability: None  . Transportation needs - medical: None  . Transportation needs - non-medical: None  Occupational History  . Occupation: truck driver-self employed  Tobacco Use  . Smoking status: Never Smoker  . Smokeless tobacco: Never Used  Substance and Sexual Activity  . Alcohol use: No    Alcohol/week: 0.0 oz  . Drug use: No  . Sexual activity: Yes  Other Topics Concern  . None  Social History Narrative  . None     Family History:  The patient's family history includes Cancer in his father; Diabetes in his mother and other.   ROS:   Please see the history of present illness.    ROS All other systems reviewed and are negative.   PHYSICAL EXAM:   VS:  BP 132/74   Pulse 82   Ht 6' (1.829 m)   Wt 260 lb 3.2 oz (118 kg)   BMI 35.29 kg/m    GENERAL:  Well appearing BM in NAD HEENT:  PERRL, EOMI, sclera are clear. Oropharynx is clear. NECK:  No jugular venous distention, carotid upstroke brisk and symmetric, no bruits, no thyromegaly or adenopathy LUNGS:  Clear to auscultation bilaterally CHEST:  Unremarkable HEART:  RRR,  PMI not displaced or sustained,S1 and S2 within normal limits, no S3, no S4: no clicks, no rubs, no murmurs ABD:  Soft, nontender. BS +, no masses or bruits. No hepatomegaly, no splenomegaly EXT:  2 + pulses throughout, no  edema, no cyanosis no clubbing SKIN:  Warm and dry.  No rashes NEURO:  Alert and oriented x 3. Cranial nerves II through XII intact. PSYCH:  Cognitively intact    Wt Readings from Last 3 Encounters:  03/24/17 260 lb 3.2 oz (118 kg)  03/01/17 257 lb (116.6 kg)  11/23/16 245 lb (111.1 kg)      Studies/Labs Reviewed:   EKG:  EKG is ordered today. NSR with rate 82. LAD, old inferior and anterior infarcts. Unchanged from prior. I have personally reviewed  and interpreted this study.   Recent Labs: 11/02/2016: ALT 12 11/23/2016: BUN 20; Creatinine, Ser 0.82; Potassium 4.6; Sodium 137   Lipid Panel    Component Value Date/Time   CHOL 215 (H) 11/02/2016 1452   TRIG 254.0 (H) 11/02/2016 1452   HDL 55.10 11/02/2016 1452   CHOLHDL 4 11/02/2016 1452   VLDL 50.8 (H) 11/02/2016 1452   LDLCALC 49 01/31/2016 1411   LDLDIRECT 123.0 11/02/2016 1452    Additional studies/ records that were reviewed today include:  Echo 05/07/16: Study Conclusions  - Left ventricle: Septal apical and inferobasal hypokinesis The   cavity size was normal. Wall thickness was increased in a pattern   of mild LVH. Systolic function was mildly to moderately reduced.   The estimated ejection fraction was in the range of 40% to 45%.   Wall motion was normal; there were no regional wall motion   abnormalities. Left ventricular diastolic function parameters   were normal. - Atrial septum: No defect or patent foramen ovale was identified.   ASSESSMENT:    1. CAD S/P percutaneous coronary angioplasty   2. CABG x 3 11/11/15 in [facility]   3. Chronic systolic heart failure ([place])   4. Essential hypertension   5. Hypercholesterolemia      PLAN:  In order of problems listed above:  1. Patient is s/p CABG. Old stent in RCA. He is asymptomatic. Continue current therapy.  Continue ASA, beta blocker.  2. Chronic systolic CHF. Last EF was  40%. He is asymptomatic. Will continue Coreg  25 mg bid. And losartan 50 mg  daily. 3. Dyslipidemia last LDL up to 123. Not clear why LDL has increased. On lipitor 40 mg daily. Will have repeat lab work with primary care. Goal LDL < 70.  4. Prostate CA-s/p RT 5.   DM type 2 with neuropathy. Per Dr. Dwyane Dee.  Medication Adjustments/Labs and Tests Ordered: Current medicines are reviewed at length with the patient today.  Concerns regarding medicines are outlined above.  Medication changes, Labs and Tests ordered today are listed in the Patient Instructions below. Patient Instructions  Continue your current therapy  I will see you in 6 months.      Signed, Jone Panebianco Martinique, MD  03/24/2017 4:14 PM    [facility] [address], [place], [place], [address] [phone]

## 2017-03-23 ENCOUNTER — Ambulatory Visit: Payer: Medicare Other | Admitting: Endocrinology

## 2017-03-24 ENCOUNTER — Encounter: Payer: Self-pay | Admitting: Cardiology

## 2017-03-24 ENCOUNTER — Ambulatory Visit: Payer: Medicare Other | Admitting: Cardiology

## 2017-03-24 VITALS — BP 132/74 | HR 82 | Ht 72.0 in | Wt 260.2 lb

## 2017-03-24 DIAGNOSIS — E7800 Pure hypercholesterolemia, unspecified: Secondary | ICD-10-CM | POA: Diagnosis not present

## 2017-03-24 DIAGNOSIS — Z9861 Coronary angioplasty status: Secondary | ICD-10-CM

## 2017-03-24 DIAGNOSIS — I10 Essential (primary) hypertension: Secondary | ICD-10-CM | POA: Diagnosis not present

## 2017-03-24 DIAGNOSIS — I2510 Atherosclerotic heart disease of native coronary artery without angina pectoris: Secondary | ICD-10-CM | POA: Diagnosis not present

## 2017-03-24 DIAGNOSIS — Z951 Presence of aortocoronary bypass graft: Secondary | ICD-10-CM | POA: Diagnosis not present

## 2017-03-24 DIAGNOSIS — I5022 Chronic systolic (congestive) heart failure: Secondary | ICD-10-CM | POA: Diagnosis not present

## 2017-03-24 NOTE — Patient Instructions (Signed)
Continue your current therapy  I will see you in 6 months.   

## 2017-03-24 NOTE — Addendum Note (Signed)
Addended by: Zebedee Iba on: 03/24/2017 04:21 PM   Modules accepted: Orders

## 2017-04-01 ENCOUNTER — Other Ambulatory Visit: Payer: Medicare Other

## 2017-04-02 ENCOUNTER — Other Ambulatory Visit (INDEPENDENT_AMBULATORY_CARE_PROVIDER_SITE_OTHER): Payer: Medicare Other

## 2017-04-02 DIAGNOSIS — E1165 Type 2 diabetes mellitus with hyperglycemia: Secondary | ICD-10-CM | POA: Diagnosis not present

## 2017-04-02 LAB — COMPREHENSIVE METABOLIC PANEL
ALK PHOS: 50 U/L (ref 39–117)
ALT: 15 U/L (ref 0–53)
AST: 13 U/L (ref 0–37)
Albumin: 4.2 g/dL (ref 3.5–5.2)
BUN: 15 mg/dL (ref 6–23)
CO2: 27 meq/L (ref 19–32)
Calcium: 10.2 mg/dL (ref 8.4–10.5)
Chloride: 104 mEq/L (ref 96–112)
Creatinine, Ser: 0.92 mg/dL (ref 0.40–1.50)
GFR: 104.66 mL/min (ref >60.00)
GLUCOSE: 147 mg/dL — AB (ref 70–99)
POTASSIUM: 5 meq/L (ref 3.5–5.1)
SODIUM: 139 meq/L (ref 135–145)
TOTAL PROTEIN: 6.8 g/dL (ref 6.0–8.3)
Total Bilirubin: 0.4 mg/dL (ref 0.2–1.2)

## 2017-04-02 LAB — LIPID PANEL
CHOL/HDL RATIO: 4
Cholesterol: 175 mg/dL (ref 0–200)
HDL: 45.4 mg/dL (ref >39.00)
NONHDL: 129.51
Triglycerides: 210 mg/dL — ABNORMAL HIGH (ref 0.0–149.0)
VLDL: 42 mg/dL — ABNORMAL HIGH (ref 0.0–40.0)

## 2017-04-02 LAB — MICROALBUMIN / CREATININE URINE RATIO
CREATININE, U: 191.6 mg/dL
MICROALB UR: 6.7 mg/dL — AB (ref 0.0–1.9)
Microalb Creat Ratio: 3.5 mg/g (ref 0.0–30.0)

## 2017-04-02 LAB — LDL CHOLESTEROL, DIRECT: Direct LDL: 101 mg/dL

## 2017-04-03 LAB — FRUCTOSAMINE: Fructosamine: 313 umol/L — ABNORMAL HIGH (ref 0–285)

## 2017-04-05 ENCOUNTER — Ambulatory Visit (INDEPENDENT_AMBULATORY_CARE_PROVIDER_SITE_OTHER): Payer: Medicare Other | Admitting: Endocrinology

## 2017-04-05 ENCOUNTER — Encounter: Payer: Self-pay | Admitting: Endocrinology

## 2017-04-05 VITALS — BP 130/80 | HR 78 | Ht 72.0 in | Wt 258.8 lb

## 2017-04-05 DIAGNOSIS — E1149 Type 2 diabetes mellitus with other diabetic neurological complication: Secondary | ICD-10-CM | POA: Diagnosis not present

## 2017-04-05 LAB — POCT GLYCOSYLATED HEMOGLOBIN (HGB A1C): Hemoglobin A1C: 7.8

## 2017-04-05 MED ORDER — GLUCOSE BLOOD VI STRP: 1.0000 | ORAL_STRIP | Freq: Every day | 1 refills | Status: DC

## 2017-04-05 NOTE — Patient Instructions (Signed)
Check blood sugars on waking up  2/7 days  Also check blood sugars about 2 hours after a meal and do this after different meals by rotation  Recommended blood sugar levels on waking up is 90-130 and about 2 hours after meal is 130-160  Please bring your blood sugar monitor to each visit, thank you  Check on GLIPIZIDE

## 2017-04-05 NOTE — Progress Notes (Addendum)
Patient ID: Clinton Kirby, male   DOB: March 12, 1945, 72 y.o.   MRN: [id number]   Reason for Appointment : Followup of 2 Diabetes  History of Present Illness          Diagnosis: Type 2 diabetes mellitus, date of diagnosis:2004      Prior history: At the time of diagnosis he was having frequent urination and his glucose was about 300 He thinks he was put on metformin and glimepiride at that time He believes his blood sugars were fairly well-controlled for the first few years  However review of his records indicate that his A1c has been around 12% since at least 2011 He was started on Invokana on 12/07/12. Prior to this home blood sugars were about 180-190 fasting.   His lab glucose readings were usually over 200  Recent history:    Non-insulin hypoglycemic drugs the patient is taking are: Janumet XR 100/1000 daily  His A1c is  7.8, had previously improved to as low as 6.8  Current management, blood sugar readings and problems identified:  He is still irregular with his follow-up and has not been seen since 10/2016  He was told to try Amaryl 1 mg at suppertime on his last visit but didn't start this  He thinks he is taking glipizide even though it is not listed on her current prescription and no prescription sent since 6/18, also he said he had stopped this on his last visit  However he is trying to take his higher dose of Janumet XR that was changed from his last visit  He did not bring his monitor  He thinks his blood sugars have been as low as 104 at home but lab glucose was over 140 and about 3 months ago nonfasting glucose was over 200  He only recently started doing a little walking  Has difficulty losing weight which is about the same     Glucose monitoring:  sporadic         Glucometer:   One Touch  Blood Glucose readings from recall: FASTING as above, does not remember his readings and does not check after meals  Hypoglycemia frequency: Never.           Self-care: The diet that the patient has been following is: None   Meals: 3 meals per day.  for breakfast has  eggs, potatoes and bread,  but sometimes cereal.   Physical activity: exercise: walking now        Dietician visit: Most recent: 09/2016         Side effects from medications have been: none   Wt Readings from Last 3 Encounters:  04/05/17 258 lb 12.8 oz (117.4 kg)  03/24/17 260 lb 3.2 oz (118 kg)  03/01/17 257 lb (116.6 kg)     Lab Results  Component Value Date   HGBA1C 7.8 04/05/2017   HGBA1C 7.0 (H) 11/23/2016   HGBA1C 6.8 11/02/2016   Lab Results  Component Value Date   MICROALBUR 6.7 (H) 04/02/2017   LDLCALC 49 01/31/2016   CREATININE 0.92 04/02/2017     Allergies as of 04/05/2017   No Known Allergies     Medication List        Accurate as of 04/05/17  9:05 PM. Always use your most recent med list.          aspirin 81 MG tablet Take 81 mg by mouth daily.   atorvastatin 40 MG tablet Commonly known as:  LIPITOR TAKE ONE  TABLET BY MOUTH ONCE DAILY   b complex vitamins tablet Take 1 tablet by mouth daily.   carvedilol 25 MG tablet Commonly known as:  COREG Take 1 tablet (25 mg total) by mouth 2 (two) times daily.   clotrimazole-betamethasone cream Commonly known as:  LOTRISONE Apply topically 2 (two) times daily.   gabapentin 300 MG capsule Commonly known as:  NEURONTIN Take 1 capsule (300 mg total) by mouth at bedtime.   glucose blood test strip Commonly known as:  ONETOUCH VERIO 1 each by Other route daily. Dx E11.9   losartan 50 MG tablet Commonly known as:  COZAAR TAKE 1 TABLET BY MOUTH EVERY DAY   meclizine 25 MG tablet Commonly known as:  ANTIVERT TAKE 1 TABLET BY MOUTH THREE TIMES DAILY AS NEEDED FOR  DIZZINESS   ONETOUCH DELICA LANCETS FINE Misc Use to help check blood sugars daily Dx E11.9   SitaGLIPtin-MetFORMIN HCl 50-1000 MG Tb24 2 tablets daily at dinner   tamsulosin 0.4 MG Caps capsule Commonly known as:   FLOMAX Take 1 capsule (0.4 mg total) by mouth daily after supper.   Vitamin D3 2000 units capsule Take 1 capsule (2,000 Units total) by mouth daily.       Allergies: No Known Allergies  Past Medical History:  Diagnosis Date  . Balanitis   . CAD (coronary artery disease)   . Depression   . Diabetes mellitus type II   . ED (erectile dysfunction)   . HTN (hypertension)   . Hyperlipidemia   . Old MI (myocardial infarction) 12/07/2013  . Prostate cancer [facility])     Past Surgical History:  Procedure Laterality Date  . CORONARY ARTERY BYPASS GRAFT  10/2015  . PROSTATE BIOPSY    . UMBILICAL HERNIA REPAIR      Family History  Problem Relation Age of Onset  . Diabetes Mother   . Cancer Father        ?  . Diabetes Other     Social History:  reports that  has never smoked. he has never used smokeless tobacco. He reports that he does not drink alcohol or use drugs.    Review of Systems       Lipids:   LDL borderline, taking Lipitor 40 mg   Lab Results  Component Value Date   CHOL 175 04/02/2017   HDL 45.40 04/02/2017   LDLCALC 49 01/31/2016   LDLDIRECT 101.0 04/02/2017   TRIG 210.0 (H) 04/02/2017   CHOLHDL 4 04/02/2017           HYPERTENSION:  Treated by PCP with 50mg  losartan and Coreg      NEUROPATHY: Has had symptomatic neuropathy treated with gabapentin    LABS:   Office Visit on 04/05/2017  Component Date Value Ref Range Status  . Hemoglobin A1C 04/05/2017 7.8   Final  Lab on 04/02/2017  Component Date Value Ref Range Status  . Fructosamine 04/02/2017 313* 0 - 285 umol/L Final   Comment: Published reference interval for apparently healthy subjects between age 16 and 42 is 63 - 285 umol/L and in a poorly controlled diabetic population is 228 - 563 umol/L with a mean of 396 umol/L.   [name] Microalb, Ur 04/02/2017 6.7* 0.0 - 1.9 mg/dL Final  . Creatinine,U 04/02/2017 191.6  mg/dL Final  . Microalb Creat Ratio 04/02/2017 3.5  0.0 - 30.0 mg/g Final  .  Cholesterol 04/02/2017 175  0 - 200 mg/dL Final   ATP III Classification  Desirable:  < 200 mg/dL               Borderline High:  200 - 239 mg/dL          High:  > = 240 mg/dL  . Triglycerides 04/02/2017 210.0* 0.0 - 149.0 mg/dL Final   Normal:  <150 mg/dLBorderline High:  150 - 199 mg/dL  . HDL 04/02/2017 45.40  >39.00 mg/dL Final  . VLDL 04/02/2017 42.0* 0.0 - 40.0 mg/dL Final  . Total CHOL/HDL Ratio 04/02/2017 4   Final                  Men          Women1/2 Average Risk     3.4          3.3Average Risk          5.0          4.42X Average Risk          9.6          7.13X Average Risk          15.0          11.0                      . NonHDL 04/02/2017 129.51   Final   NOTE:  Non-HDL goal should be 30 mg/dL higher than patient's LDL goal (i.e. LDL goal of < 70 mg/dL, would have non-HDL goal of < 100 mg/dL)  . Sodium 04/02/2017 139  135 - 145 mEq/L Final  . Potassium 04/02/2017 5.0  3.5 - 5.1 mEq/L Final  . Chloride 04/02/2017 104  96 - 112 mEq/L Final  . CO2 04/02/2017 27  19 - 32 mEq/L Final  . Glucose, Bld 04/02/2017 147* 70 - 99 mg/dL Final  . BUN 04/02/2017 15  6 - 23 mg/dL Final  . Creatinine, Ser 04/02/2017 0.92  0.40 - 1.50 mg/dL Final  . Total Bilirubin 04/02/2017 0.4  0.2 - 1.2 mg/dL Final  . Alkaline Phosphatase 04/02/2017 50  39 - 117 U/L Final  . AST 04/02/2017 13  0 - 37 U/L Final  . ALT 04/02/2017 15  0 - 53 U/L Final  . Total Protein 04/02/2017 6.8  6.0 - 8.3 g/dL Final  . Albumin 04/02/2017 4.2  3.5 - 5.2 g/dL Final  . Calcium 04/02/2017 10.2  8.4 - 10.5 mg/dL Final  . GFR 04/02/2017 104.66  >60.00 mL/min Final  . Direct LDL 04/02/2017 101.0  mg/dL Final   Optimal:  <100 mg/dLNear or Above Optimal:  100-129 mg/dLBorderline High:  130-159 mg/dLHigh:  160-189 mg/dLVery High:  >190 mg/dL    Physical Examination:  BP 130/80 (BP Location: Left Arm, Patient Position: Sitting, Cuff Size: Large)   Pulse 78   Ht 6' (1.829 m)   Wt 258 lb 12.8 oz (117.4 kg)   SpO2 97%    BMI 35.10 kg/m        ASSESSMENT/PLAN  Diabetes type 2, non-insulin requiring  His A1c is 7.8, higher than before   See history of present illness for detailed discussion of current diabetes management, blood sugar patterns and problems identified  Unable to explain by his A1c is higher than last year He does not check blood sugars much and frequently checks only fasting readings and not any readings after meals Not clear if his diet has been consistent over the last 3 months causing his A1c to be higher Discussed that  he is not benefiting from taking the higher dose of Janumet XR He also does not want to try Invokana because of cost As discussed above unclear whether he is taking glipizide or not although he claims he is  Recommendations:  Check to see if he is taking glipizide  He should benefit better from taking Amaryl in the morning instead of glipizide for better postprandial control  He was checked readings after meals more consistently and bring his monitor for download on each visit Consider consultation with dietitian More regular follow-up   LIPIDS: LDL is borderline, will continue to monitor but consider changing to Crestor   Patient Instructions  Check blood sugars on waking up  2/7 days  Also check blood sugars about 2 hours after a meal and do this after different meals by rotation  Recommended blood sugar levels on waking up is 90-130 and about 2 hours after meal is 130-160  Please bring your blood sugar monitor to each visit, thank you  Check on GLIPIZIDE      Elayne Snare 04/05/2017, 9:05 PM     Addendum: He reported back that he is taking his glipizide and he will continue unless sugars are higher after lunch or dinner

## 2017-04-06 ENCOUNTER — Telehealth: Payer: Self-pay | Admitting: Endocrinology

## 2017-04-06 NOTE — Telephone Encounter (Signed)
Dr Dwyane Dee wanted to know if patient was taking Glipizide ER 2.5 mg. Patient is taking the above medication. This medication is not on his medication list that we printed

## 2017-04-06 NOTE — Telephone Encounter (Signed)
Please add to the medication list.  Also he needs to call us back next week if his blood sugars 2 hours after meals are staying consistently over 180

## 2017-04-06 NOTE — Telephone Encounter (Signed)
Please advise on below  

## 2017-04-06 NOTE — Telephone Encounter (Signed)
Added to med list, pt is aware

## 2017-04-07 ENCOUNTER — Telehealth: Payer: Self-pay | Admitting: Cardiology

## 2017-04-07 MED ORDER — CARVEDILOL 25 MG PO TABS: 25.0000 mg | ORAL_TABLET | Freq: Two times a day (BID) | ORAL | 1 refills | Status: DC

## 2017-04-07 MED ORDER — LOSARTAN POTASSIUM 50 MG PO TABS: 50.0000 mg | ORAL_TABLET | Freq: Every day | ORAL | 1 refills | Status: DC

## 2017-04-07 NOTE — Telephone Encounter (Signed)
New Message    *STAT* If patient is at the pharmacy, call can be transferred to refill team.   1. Which medications need to be refilled? (please list name of each medication and dose if known) carvedilol (COREG) 25 MG tablet and losartan 50mg   2. Which pharmacy/location (including street and city if local pharmacy) is medication to be sent to? [facility] mail order pharmacy   3. Do they need a 30 day or 90 day supply? [place]

## 2017-04-16 ENCOUNTER — Telehealth: Payer: Self-pay | Admitting: Internal Medicine

## 2017-04-16 DIAGNOSIS — C61 Malignant neoplasm of prostate: Secondary | ICD-10-CM

## 2017-04-16 MED ORDER — ONETOUCH DELICA LANCETS FINE MISC: 1 refills | Status: DC

## 2017-04-16 MED ORDER — SITAGLIP PHOS-METFORMIN HCL ER 50-1000 MG PO TB24: ORAL_TABLET | ORAL | 1 refills | Status: DC

## 2017-04-16 MED ORDER — GLUCOSE BLOOD VI STRP: 1.0000 | ORAL_STRIP | Freq: Every day | 1 refills | Status: DC

## 2017-04-16 MED ORDER — GLIPIZIDE ER 2.5 MG PO TB24: 2.5000 mg | ORAL_TABLET | Freq: Every day | ORAL | 1 refills | Status: DC

## 2017-04-16 MED ORDER — ONETOUCH VERIO IQ SYSTEM W/DEVICE KIT: PACK | 0 refills | Status: DC

## 2017-04-16 MED ORDER — TAMSULOSIN HCL 0.4 MG PO CAPS: 0.4000 mg | ORAL_CAPSULE | Freq: Every day | ORAL | 1 refills | Status: DC

## 2017-04-16 NOTE — Telephone Encounter (Signed)
Reviewed chart pt is up-to-date sent refills to [facility]  

## 2017-04-16 NOTE — Telephone Encounter (Signed)
Copied from [place]. Topic: Quick Communication - Rx Refill/Question >> Apr 16, 2017 10:24 AM Synthia Innocent wrote: Medication: glipiZIDE (GLUCOTROL XL) 2.5 MG 24 hr tablet, tamsulosin (FLOMAX) 0.4 MG CAPS capsule, ONETOUCH DELICA LANCETS FINE MISC, test strips & IQ kit, invokana 34m, janumet XR 60-1000 mg, CS Onetouch Meter.    Has the patient contacted their pharmacy? Yes.     (Agent: If no, request that the patient contact the pharmacy for the refill.)   Preferred Pharmacy (with phone number or street name): [facility]   Agent: Please be advised that RX refills may take up to 3 business days. We ask that you follow-up with your pharmacy.  Ref# [id number]

## 2017-04-16 NOTE — Telephone Encounter (Signed)
LOV: 03/01/17  Dr. Alain Marion  [facility]

## 2017-04-21 ENCOUNTER — Telehealth: Payer: Self-pay | Admitting: Internal Medicine

## 2017-04-21 NOTE — Telephone Encounter (Unsigned)
Copied from [place] [id number]. Topic: Quick Communication - Rx Refill/Question >> Apr 21, 2017  3:05 PM Neva Seat wrote: Invokana OneTouch Veral test strips  Ref #  [id number]  Pharmacy sent faxes - calling to follow up Pt is needing refills.  Please call [facility] for pt refills.  [place], [facility]  [address] [place] [address] Phone: [phone] Fax: [phone] Not a 24 hour pharmacy; exact hours not known

## 2017-04-22 DIAGNOSIS — M79672 Pain in left foot: Secondary | ICD-10-CM | POA: Diagnosis not present

## 2017-04-22 DIAGNOSIS — S86899A Other injury of other muscle(s) and tendon(s) at lower leg level, unspecified leg, initial encounter: Secondary | ICD-10-CM | POA: Diagnosis not present

## 2017-04-22 DIAGNOSIS — M7742 Metatarsalgia, left foot: Secondary | ICD-10-CM | POA: Diagnosis not present

## 2017-04-25 DIAGNOSIS — M79662 Pain in left lower leg: Secondary | ICD-10-CM | POA: Diagnosis not present

## 2017-04-28 DIAGNOSIS — I743 Embolism and thrombosis of arteries of the lower extremities: Secondary | ICD-10-CM | POA: Diagnosis not present

## 2017-04-28 DIAGNOSIS — I70202 Unspecified atherosclerosis of native arteries of extremities, left leg: Secondary | ICD-10-CM | POA: Diagnosis not present

## 2017-04-28 DIAGNOSIS — M79662 Pain in left lower leg: Secondary | ICD-10-CM | POA: Diagnosis not present

## 2017-05-02 DIAGNOSIS — S8392XA Sprain of unspecified site of left knee, initial encounter: Secondary | ICD-10-CM | POA: Diagnosis not present

## 2017-05-02 DIAGNOSIS — S6992XA Unspecified injury of left wrist, hand and finger(s), initial encounter: Secondary | ICD-10-CM | POA: Diagnosis not present

## 2017-05-11 DIAGNOSIS — M6281 Muscle weakness (generalized): Secondary | ICD-10-CM | POA: Diagnosis not present

## 2017-05-11 DIAGNOSIS — R262 Difficulty in walking, not elsewhere classified: Secondary | ICD-10-CM | POA: Diagnosis not present

## 2017-05-11 DIAGNOSIS — R201 Hypoesthesia of skin: Secondary | ICD-10-CM | POA: Diagnosis not present

## 2017-05-11 DIAGNOSIS — S86899A Other injury of other muscle(s) and tendon(s) at lower leg level, unspecified leg, initial encounter: Secondary | ICD-10-CM | POA: Diagnosis not present

## 2017-05-11 DIAGNOSIS — I739 Peripheral vascular disease, unspecified: Secondary | ICD-10-CM | POA: Diagnosis not present

## 2017-05-14 ENCOUNTER — Other Ambulatory Visit (INDEPENDENT_AMBULATORY_CARE_PROVIDER_SITE_OTHER): Payer: Medicare Other

## 2017-05-14 DIAGNOSIS — E1149 Type 2 diabetes mellitus with other diabetic neurological complication: Secondary | ICD-10-CM

## 2017-05-14 DIAGNOSIS — E1165 Type 2 diabetes mellitus with hyperglycemia: Secondary | ICD-10-CM

## 2017-05-14 LAB — BASIC METABOLIC PANEL
BUN: 19 mg/dL (ref 6–23)
CALCIUM: 9.7 mg/dL (ref 8.4–10.5)
CHLORIDE: 103 meq/L (ref 96–112)
CO2: 28 meq/L (ref 19–32)
Creatinine, Ser: 0.99 mg/dL (ref 0.40–1.50)
GFR: 96.13 mL/min (ref >60.00)
GLUCOSE: 155 mg/dL — AB (ref 70–99)
Potassium: 4.9 mEq/L (ref 3.5–5.1)
SODIUM: 138 meq/L (ref 135–145)

## 2017-05-14 LAB — LDL CHOLESTEROL, DIRECT: Direct LDL: 106 mg/dL

## 2017-05-14 LAB — MICROALBUMIN / CREATININE URINE RATIO
Creatinine,U: 184.1 mg/dL
MICROALB/CREAT RATIO: 3.4 mg/g (ref 0.0–30.0)
Microalb, Ur: 6.3 mg/dL — ABNORMAL HIGH (ref 0.0–1.9)

## 2017-05-15 LAB — FRUCTOSAMINE: Fructosamine: 303 umol/L — ABNORMAL HIGH (ref 0–285)

## 2017-05-16 NOTE — Progress Notes (Deleted)
Patient ID: DENO SIDA, male   DOB: [birth year], 72 y.o.   MRN: [id number]   Reason for Appointment : Followup of 2 Diabetes  History of Present Illness          Diagnosis: Type 2 diabetes mellitus, date of diagnosis:2004      Prior history: At the time of diagnosis he was having frequent urination and his glucose was about 300 He thinks he was put on metformin and glimepiride at that time He believes his blood sugars were fairly well-controlled for the first few years  However review of his records indicate that his A1c has been around 12% since at least 2011 He was started on Invokana on 12/07/12. Prior to this home blood sugars were about 180-190 fasting.   His lab glucose readings were usually over 200  Recent history:    Non-insulin hypoglycemic drugs the patient is taking are: Janumet XR 100/1000 daily  His A1c is  7.8, had previously improved to as low as 6.8  Current management, blood sugar readings and problems identified:  He is still irregular with his follow-up and has not been seen since 10/2016  He was told to try Amaryl 1 mg at suppertime on his last visit but didn't start this  He thinks he is taking glipizide even though it is not listed on her current prescription and no prescription sent since 6/18, also he said he had stopped this on his last visit  However he is trying to take his higher dose of Janumet XR that was changed from his last visit  He did not bring his monitor  He thinks his blood sugars have been as low as 104 at home but lab glucose was over 140 and about 3 months ago nonfasting glucose was over 200  He only recently started doing a little walking  Has difficulty losing weight which is about the same     Glucose monitoring:  sporadic         Glucometer:   One Touch  Blood Glucose readings from recall: FASTING as above, does not remember his readings and does not check after meals  Hypoglycemia frequency: Never.           Self-care: The diet that the patient has been following is: None   Meals: 3 meals per day.  for breakfast has  eggs, potatoes and bread,  but sometimes cereal.   Physical activity: exercise: walking now        Dietician visit: Most recent: 09/2016         Side effects from medications have been: none   Wt Readings from Last 3 Encounters:  04/05/17 258 lb 12.8 oz (117.4 kg)  03/24/17 260 lb 3.2 oz (118 kg)  03/01/17 257 lb (116.6 kg)     Lab Results  Component Value Date   HGBA1C 7.8 04/05/2017   HGBA1C 7.0 (H) 11/23/2016   HGBA1C 6.8 11/02/2016   Lab Results  Component Value Date   MICROALBUR 6.3 (H) 05/14/2017   LDLCALC 49 01/31/2016   CREATININE 0.99 05/14/2017     Allergies as of 05/17/2017   No Known Allergies     Medication List        Accurate as of 05/16/17  9:26 PM. Always use your most recent med list.          aspirin 81 MG tablet Take 81 mg by mouth daily.   atorvastatin 40 MG tablet Commonly known as:  LIPITOR TAKE ONE  TABLET BY MOUTH ONCE DAILY   b complex vitamins tablet Take 1 tablet by mouth daily.   carvedilol 25 MG tablet Commonly known as:  COREG Take 1 tablet (25 mg total) by mouth 2 (two) times daily.   clotrimazole-betamethasone cream Commonly known as:  LOTRISONE Apply topically 2 (two) times daily.   gabapentin 300 MG capsule Commonly known as:  NEURONTIN Take 1 capsule (300 mg total) by mouth at bedtime.   glipiZIDE 2.5 MG 24 hr tablet Commonly known as:  GLUCOTROL XL Take 1 tablet (2.5 mg total) by mouth daily with breakfast.   glucose blood test strip Commonly known as:  ONETOUCH VERIO 1 each by Other route daily. Dx E11.9   losartan 50 MG tablet Commonly known as:  COZAAR Take 1 tablet (50 mg total) by mouth daily.   meclizine 25 MG tablet Commonly known as:  ANTIVERT TAKE 1 TABLET BY MOUTH THREE TIMES DAILY AS NEEDED FOR  DIZZINESS   ONETOUCH DELICA LANCETS FINE Misc Use to help check blood sugars daily Dx  E11.9   ONETOUCH VERIO IQ SYSTEM w/Device Kit Use to check blood sugars daily   SitaGLIPtin-MetFORMIN HCl 50-1000 MG Tb24 2 tablets daily at dinner   tamsulosin 0.4 MG Caps capsule Commonly known as:  FLOMAX Take 1 capsule (0.4 mg total) by mouth daily after supper.   Vitamin D3 2000 units capsule Take 1 capsule (2,000 Units total) by mouth daily.       Allergies: No Known Allergies  Past Medical History:  Diagnosis Date  . Balanitis   . CAD (coronary artery disease)   . Depression   . Diabetes mellitus type II   . ED (erectile dysfunction)   . HTN (hypertension)   . Hyperlipidemia   . Old MI (myocardial infarction) 12/07/2013  . Prostate cancer [facility])     Past Surgical History:  Procedure Laterality Date  . CORONARY ARTERY BYPASS GRAFT  10/2015  . PROSTATE BIOPSY    . UMBILICAL HERNIA REPAIR      Family History  Problem Relation Age of Onset  . Diabetes Mother   . Cancer Father        ?  . Diabetes Other     Social History:  reports that he has never smoked. He has never used smokeless tobacco. He reports that he does not drink alcohol or use drugs.    Review of Systems       Lipids:   LDL borderline, taking Lipitor 40 mg   Lab Results  Component Value Date   CHOL 175 04/02/2017   HDL 45.40 04/02/2017   LDLCALC 49 01/31/2016   LDLDIRECT 106.0 05/14/2017   TRIG 210.0 (H) 04/02/2017   CHOLHDL 4 04/02/2017           HYPERTENSION:  Treated by PCP with 28m losartan and Coreg      NEUROPATHY: Has had symptomatic neuropathy treated with gabapentin    LABS:   Lab on 05/14/2017  Component Date Value Ref Range Status  . Direct LDL 05/14/2017 106.0  mg/dL Final   Optimal:  <100 mg/dLNear or Above Optimal:  100-129 mg/dLBorderline High:  130-159 mg/dLHigh:  160-189 mg/dLVery High:  >190 mg/dL  . Fructosamine 05/14/2017 303* 0 - 285 umol/L Final   Comment: Published reference interval for apparently healthy subjects between age 1236and 658is 26-  285 umol/L and in a poorly controlled diabetic population is 228 - 563 umol/L with a mean of 396 umol/L.   .[name]  Sodium 05/14/2017 138  135 - 145 mEq/L Final  . Potassium 05/14/2017 4.9  3.5 - 5.1 mEq/L Final  . Chloride 05/14/2017 103  96 - 112 mEq/L Final  . CO2 05/14/2017 28  19 - 32 mEq/L Final  . Glucose, Bld 05/14/2017 155* 70 - 99 mg/dL Final  . BUN 05/14/2017 19  6 - 23 mg/dL Final  . Creatinine, Ser 05/14/2017 0.99  0.40 - 1.50 mg/dL Final  . Calcium 05/14/2017 9.7  8.4 - 10.5 mg/dL Final  . GFR 05/14/2017 96.13  >60.00 mL/min Final  . Microalb, Ur 05/14/2017 6.3* 0.0 - 1.9 mg/dL Final  . Creatinine,U 05/14/2017 184.1  mg/dL Final  . Microalb Creat Ratio 05/14/2017 3.4  0.0 - 30.0 mg/g Final    Physical Examination:  There were no vitals taken for this visit.       ASSESSMENT/PLAN  Diabetes type 2, non-insulin requiring  His A1c is 7.8, higher than before   See history of present illness for detailed discussion of current diabetes management, blood sugar patterns and problems identified  Unable to explain by his A1c is higher than last year He does not check blood sugars much and frequently checks only fasting readings and not any readings after meals Not clear if his diet has been consistent over the last 3 months causing his A1c to be higher Discussed that he is not benefiting from taking the higher dose of Janumet XR He also does not want to try Invokana because of cost As discussed above unclear whether he is taking glipizide or not although he claims he is  Recommendations:  Check to see if he is taking glipizide  He should benefit better from taking Amaryl in the morning instead of glipizide for better postprandial control  He was checked readings after meals more consistently and bring his monitor for download on each visit Consider consultation with dietitian More regular follow-up   LIPIDS: LDL is borderline, will continue to monitor but consider  changing to Crestor   There are no Patient Instructions on file for this visit.  Elayne Snare 05/16/2017, 9:26 PM     Addendum: He reported back that he is taking his glipizide and he will continue unless sugars are higher after lunch or dinner

## 2017-05-17 ENCOUNTER — Encounter: Payer: Self-pay | Admitting: Endocrinology

## 2017-05-17 ENCOUNTER — Ambulatory Visit: Payer: Medicare Other | Admitting: Endocrinology

## 2017-05-17 ENCOUNTER — Encounter: Payer: Medicare Other | Admitting: Endocrinology

## 2017-05-17 NOTE — Progress Notes (Signed)
This encounter was created in error - please disregard.

## 2017-05-24 DIAGNOSIS — C61 Malignant neoplasm of prostate: Secondary | ICD-10-CM | POA: Diagnosis not present

## 2017-06-11 ENCOUNTER — Ambulatory Visit: Payer: Medicare Other | Admitting: Internal Medicine

## 2017-06-12 ENCOUNTER — Other Ambulatory Visit: Payer: Self-pay | Admitting: Cardiology

## 2017-06-12 ENCOUNTER — Other Ambulatory Visit: Payer: Self-pay | Admitting: Internal Medicine

## 2017-06-14 ENCOUNTER — Encounter: Payer: Self-pay | Admitting: Internal Medicine

## 2017-06-14 ENCOUNTER — Ambulatory Visit (INDEPENDENT_AMBULATORY_CARE_PROVIDER_SITE_OTHER): Payer: Medicare Other | Admitting: Internal Medicine

## 2017-06-14 VITALS — BP 132/78 | HR 82 | Temp 98.7°F | Ht 72.0 in | Wt 260.0 lb

## 2017-06-14 DIAGNOSIS — E1165 Type 2 diabetes mellitus with hyperglycemia: Secondary | ICD-10-CM

## 2017-06-14 DIAGNOSIS — Z23 Encounter for immunization: Secondary | ICD-10-CM | POA: Diagnosis not present

## 2017-06-14 DIAGNOSIS — C61 Malignant neoplasm of prostate: Secondary | ICD-10-CM | POA: Diagnosis not present

## 2017-06-14 DIAGNOSIS — E1149 Type 2 diabetes mellitus with other diabetic neurological complication: Secondary | ICD-10-CM

## 2017-06-14 DIAGNOSIS — I10 Essential (primary) hypertension: Secondary | ICD-10-CM

## 2017-06-14 DIAGNOSIS — E1142 Type 2 diabetes mellitus with diabetic polyneuropathy: Secondary | ICD-10-CM | POA: Diagnosis not present

## 2017-06-14 DIAGNOSIS — E0842 Diabetes mellitus due to underlying condition with diabetic polyneuropathy: Secondary | ICD-10-CM | POA: Diagnosis not present

## 2017-06-14 MED ORDER — SITAGLIP PHOS-METFORMIN HCL ER 50-1000 MG PO TB24: ORAL_TABLET | ORAL | 1 refills | Status: DC

## 2017-06-14 MED ORDER — GLUCOSE BLOOD VI STRP: 1.0000 | ORAL_STRIP | Freq: Every day | 1 refills | Status: DC

## 2017-06-14 MED ORDER — ATORVASTATIN CALCIUM 40 MG PO TABS: 40.0000 mg | ORAL_TABLET | Freq: Every day | ORAL | 3 refills | Status: DC

## 2017-06-14 MED ORDER — GLIPIZIDE ER 2.5 MG PO TB24: 2.5000 mg | ORAL_TABLET | Freq: Every day | ORAL | 1 refills | Status: DC

## 2017-06-14 MED ORDER — GABAPENTIN 300 MG PO CAPS: 300.0000 mg | ORAL_CAPSULE | Freq: Every day | ORAL | 3 refills | Status: DC

## 2017-06-14 MED ORDER — CARVEDILOL 25 MG PO TABS: 25.0000 mg | ORAL_TABLET | Freq: Two times a day (BID) | ORAL | 3 refills | Status: DC

## 2017-06-14 MED ORDER — LOSARTAN POTASSIUM 50 MG PO TABS: 50.0000 mg | ORAL_TABLET | Freq: Every day | ORAL | 3 refills | Status: DC

## 2017-06-14 MED ORDER — ONETOUCH DELICA LANCETS FINE MISC: 1 refills | Status: DC

## 2017-06-14 MED ORDER — TAMSULOSIN HCL 0.4 MG PO CAPS: 0.4000 mg | ORAL_CAPSULE | Freq: Every day | ORAL | 3 refills | Status: DC

## 2017-06-14 NOTE — Telephone Encounter (Signed)
REFILL 

## 2017-06-14 NOTE — Assessment & Plan Note (Signed)
Losartan, Coreg 

## 2017-06-14 NOTE — Assessment & Plan Note (Signed)
Gabapentin

## 2017-06-14 NOTE — Addendum Note (Signed)
Addended by: Karren Cobble on: 06/14/2017 03:52 PM   Modules accepted: Orders

## 2017-06-14 NOTE — Assessment & Plan Note (Signed)
Pt is seeing Dr Joelene Millin in [place] for neuropathy

## 2017-06-14 NOTE — Progress Notes (Signed)
Subjective:  Patient ID: Clinton Kirby, male    DOB: 1945/12/08  Age: 72 y.o. MRN: [id number]  CC: No chief complaint on file.   HPI Clinton Kirby presents for DM, HTN, LE neuropathy and vertigo f/u F/u prostate ca Seeing a neurologist - Dr Margarita Grizzle in [facility] The pt wants to switch from Dr Dwyane Dee due to the issues related to his last visit to Dr Ronnie Derby office   Outpatient Medications Prior to Visit  Medication Sig Dispense Refill  . aspirin 81 MG tablet Take 81 mg by mouth daily.    [name] atorvastatin (LIPITOR) 40 MG tablet TAKE ONE TABLET BY MOUTH ONCE DAILY 90 tablet 1  . b complex vitamins tablet Take 1 tablet by mouth daily. 100 tablet 3  . Blood Glucose Monitoring Suppl (ONETOUCH VERIO IQ SYSTEM) w/Device KIT USE TO CHECK BLOOD SUGAR  DAILY. DX E11.9 1 kit 0  . carvedilol (COREG) 25 MG tablet TAKE 1 TABLET BY MOUTH TWO  TIMES DAILY 180 tablet 1  . Cholecalciferol (VITAMIN D3) 2000 units capsule Take 1 capsule (2,000 Units total) by mouth daily. 100 capsule 3  . clotrimazole-betamethasone (LOTRISONE) cream Apply topically 2 (two) times daily. 45 g 1  . gabapentin (NEURONTIN) 300 MG capsule Take 1 capsule (300 mg total) by mouth at bedtime. 30 capsule 3  . glipiZIDE (GLUCOTROL XL) 2.5 MG 24 hr tablet Take 1 tablet (2.5 mg total) by mouth daily with breakfast. 90 tablet 1  . glucose blood (ONETOUCH VERIO) test strip 1 each by Other route daily. Use to check blood sugars twice a day 300 each 1  . losartan (COZAAR) 50 MG tablet TAKE 1 TABLET BY MOUTH  DAILY 90 tablet 1  . meclizine (ANTIVERT) 25 MG tablet TAKE 1 TABLET BY MOUTH THREE TIMES DAILY AS NEEDED FOR  DIZZINESS 30 tablet 1  . ONETOUCH DELICA LANCETS FINE MISC Use to help check blood sugars twice a day. Dx E11.9 300 each 1  . SitaGLIPtin-MetFORMIN HCl 50-1000 MG TB24 2 tablets daily at dinner 180 tablet 1  . tamsulosin (FLOMAX) 0.4 MG CAPS capsule Take 1 capsule (0.4 mg total) by mouth daily after supper. 90 capsule 1    Facility-Administered Medications Prior to Visit  Medication Dose Route Frequency Provider Last Rate Last Dose  . aspirin chewable tablet 81 mg  81 mg Oral Once Martinique, Peter M, MD        ROS Review of Systems  Constitutional: Positive for diaphoresis. Negative for appetite change, fatigue and unexpected weight change.  HENT: Negative for congestion, nosebleeds, sneezing, sore throat and trouble swallowing.   Eyes: Negative for itching and visual disturbance.  Respiratory: Negative for cough.   Cardiovascular: Negative for chest pain, palpitations and leg swelling.  Gastrointestinal: Negative for abdominal distention, blood in stool, diarrhea and nausea.  Genitourinary: Negative for frequency and hematuria.  Musculoskeletal: Positive for arthralgias, back pain and gait problem. Negative for joint swelling and neck pain.  Skin: Negative for rash.  Neurological: Positive for dizziness and numbness. Negative for tremors, speech difficulty and weakness.  Psychiatric/Behavioral: Negative for agitation, dysphoric mood, sleep disturbance and suicidal ideas. The patient is not nervous/anxious.     Objective:  BP 132/78 (BP Location: Left Arm, Patient Position: Sitting, Cuff Size: Large)   Pulse 82   Temp 98.7 F (37.1 C) (Oral)   Ht 6' (1.829 m)   Wt 260 lb (117.9 kg)   SpO2 98%   BMI 35.26 kg/m   BP  Readings from Last 3 Encounters:  06/14/17 132/78  04/05/17 130/80  03/24/17 132/74    Wt Readings from Last 3 Encounters:  06/14/17 260 lb (117.9 kg)  04/05/17 258 lb 12.8 oz (117.4 kg)  03/24/17 260 lb 3.2 oz (118 kg)    Physical Exam  Constitutional: He is oriented to person, place, and time. He appears well-developed. No distress.  NAD  HENT:  Mouth/Throat: Oropharynx is clear and moist.  Eyes: Pupils are equal, round, and reactive to light. Conjunctivae are normal.  Neck: Normal range of motion. No JVD present. No thyromegaly present.  Cardiovascular: Normal rate,  regular rhythm, normal heart sounds and intact distal pulses. Exam reveals no gallop and no friction rub.  No murmur heard. Pulmonary/Chest: Effort normal and breath sounds normal. No respiratory distress. He has no wheezes. He has no rales. He exhibits no tenderness.  Abdominal: Soft. Bowel sounds are normal. He exhibits no distension and no mass. There is no tenderness. There is no rebound and no guarding.  Musculoskeletal: Normal range of motion. He exhibits no edema or tenderness.  Lymphadenopathy:    He has no cervical adenopathy.  Neurological: He is alert and oriented to person, place, and time. He has normal reflexes. No cranial nerve deficit. He exhibits normal muscle tone. He displays a negative Romberg sign. Coordination abnormal. Gait normal.  Skin: Skin is warm and dry. No rash noted.  Psychiatric: He has a normal mood and affect. His behavior is normal. Judgment and thought content normal.   Obese  Ataxic   Lab Results  Component Value Date   WBC 7.6 11/09/2014   HGB 16.7 11/09/2014   HCT 49.5 11/09/2014   PLT 303.0 11/09/2014   GLUCOSE 155 (H) 05/14/2017   CHOL 175 04/02/2017   TRIG 210.0 (H) 04/02/2017   HDL 45.40 04/02/2017   LDLDIRECT 106.0 05/14/2017   LDLCALC 49 01/31/2016   ALT 15 04/02/2017   AST 13 04/02/2017   NA 138 05/14/2017   K 4.9 05/14/2017   CL 103 05/14/2017   CREATININE 0.99 05/14/2017   BUN 19 05/14/2017   CO2 28 05/14/2017   TSH 0.72 11/09/2014   PSA 41.85 (H) 06/25/2015   HGBA1C 7.8 04/05/2017   MICROALBUR 6.3 (H) 05/14/2017    No results found.  Assessment & Plan:      Follow-up: No follow-ups on file.  Walker Kehr, MD

## 2017-06-14 NOTE — Assessment & Plan Note (Addendum)
Janumet The pt wants to switch from Dr Dwyane Dee due to the issues related to his last visit to Dr Ronnie Derby office

## 2017-06-14 NOTE — Assessment & Plan Note (Signed)
Lupron

## 2017-07-01 DIAGNOSIS — G629 Polyneuropathy, unspecified: Secondary | ICD-10-CM | POA: Diagnosis not present

## 2017-07-08 LAB — HM DIABETES EYE EXAM

## 2017-07-19 DIAGNOSIS — M7742 Metatarsalgia, left foot: Secondary | ICD-10-CM | POA: Diagnosis not present

## 2017-07-19 DIAGNOSIS — E1140 Type 2 diabetes mellitus with diabetic neuropathy, unspecified: Secondary | ICD-10-CM | POA: Diagnosis not present

## 2017-07-19 DIAGNOSIS — M79672 Pain in left foot: Secondary | ICD-10-CM | POA: Diagnosis not present

## 2017-07-19 DIAGNOSIS — G629 Polyneuropathy, unspecified: Secondary | ICD-10-CM | POA: Diagnosis not present

## 2017-07-19 DIAGNOSIS — Z79899 Other long term (current) drug therapy: Secondary | ICD-10-CM | POA: Diagnosis not present

## 2017-07-19 DIAGNOSIS — G894 Chronic pain syndrome: Secondary | ICD-10-CM | POA: Diagnosis not present

## 2017-07-19 DIAGNOSIS — E1122 Type 2 diabetes mellitus with diabetic chronic kidney disease: Secondary | ICD-10-CM | POA: Diagnosis not present

## 2017-07-27 DIAGNOSIS — M79674 Pain in right toe(s): Secondary | ICD-10-CM | POA: Diagnosis not present

## 2017-07-27 DIAGNOSIS — B351 Tinea unguium: Secondary | ICD-10-CM | POA: Diagnosis not present

## 2017-07-27 DIAGNOSIS — M79675 Pain in left toe(s): Secondary | ICD-10-CM | POA: Diagnosis not present

## 2017-08-05 ENCOUNTER — Encounter: Payer: Self-pay | Admitting: Internal Medicine

## 2017-08-05 DIAGNOSIS — E1140 Type 2 diabetes mellitus with diabetic neuropathy, unspecified: Secondary | ICD-10-CM | POA: Diagnosis not present

## 2017-08-05 DIAGNOSIS — I10 Essential (primary) hypertension: Secondary | ICD-10-CM | POA: Diagnosis not present

## 2017-08-05 DIAGNOSIS — E785 Hyperlipidemia, unspecified: Secondary | ICD-10-CM | POA: Diagnosis not present

## 2017-08-05 DIAGNOSIS — E1165 Type 2 diabetes mellitus with hyperglycemia: Secondary | ICD-10-CM | POA: Diagnosis not present

## 2017-08-06 DIAGNOSIS — R110 Nausea: Secondary | ICD-10-CM | POA: Diagnosis not present

## 2017-08-06 DIAGNOSIS — R531 Weakness: Secondary | ICD-10-CM | POA: Diagnosis not present

## 2017-08-06 DIAGNOSIS — R42 Dizziness and giddiness: Secondary | ICD-10-CM | POA: Diagnosis not present

## 2017-08-06 DIAGNOSIS — T887XXA Unspecified adverse effect of drug or medicament, initial encounter: Secondary | ICD-10-CM | POA: Diagnosis not present

## 2017-08-06 DIAGNOSIS — R232 Flushing: Secondary | ICD-10-CM | POA: Diagnosis not present

## 2017-08-06 DIAGNOSIS — I252 Old myocardial infarction: Secondary | ICD-10-CM | POA: Diagnosis not present

## 2017-08-06 DIAGNOSIS — I517 Cardiomegaly: Secondary | ICD-10-CM | POA: Diagnosis not present

## 2017-08-06 DIAGNOSIS — R0789 Other chest pain: Secondary | ICD-10-CM | POA: Diagnosis not present

## 2017-08-06 DIAGNOSIS — X58XXXA Exposure to other specified factors, initial encounter: Secondary | ICD-10-CM | POA: Diagnosis not present

## 2017-08-06 DIAGNOSIS — Y999 Unspecified external cause status: Secondary | ICD-10-CM | POA: Diagnosis not present

## 2017-08-06 DIAGNOSIS — T50995A Adverse effect of other drugs, medicaments and biological substances, initial encounter: Secondary | ICD-10-CM | POA: Diagnosis not present

## 2017-08-07 DIAGNOSIS — I252 Old myocardial infarction: Secondary | ICD-10-CM | POA: Diagnosis not present

## 2017-08-07 DIAGNOSIS — I517 Cardiomegaly: Secondary | ICD-10-CM | POA: Diagnosis not present

## 2017-09-30 DIAGNOSIS — G629 Polyneuropathy, unspecified: Secondary | ICD-10-CM | POA: Diagnosis not present

## 2017-10-01 ENCOUNTER — Ambulatory Visit: Payer: Medicare Other | Admitting: Internal Medicine

## 2017-10-18 ENCOUNTER — Ambulatory Visit (INDEPENDENT_AMBULATORY_CARE_PROVIDER_SITE_OTHER): Payer: Medicare Other | Admitting: Internal Medicine

## 2017-10-18 ENCOUNTER — Encounter: Payer: Self-pay | Admitting: Internal Medicine

## 2017-10-18 VITALS — BP 216/70 | HR 88 | Temp 98.1°F | Ht 72.0 in | Wt 259.0 lb

## 2017-10-18 DIAGNOSIS — E0842 Diabetes mellitus due to underlying condition with diabetic polyneuropathy: Secondary | ICD-10-CM | POA: Diagnosis not present

## 2017-10-18 DIAGNOSIS — E1149 Type 2 diabetes mellitus with other diabetic neurological complication: Secondary | ICD-10-CM | POA: Diagnosis not present

## 2017-10-18 DIAGNOSIS — I10 Essential (primary) hypertension: Secondary | ICD-10-CM | POA: Diagnosis not present

## 2017-10-18 DIAGNOSIS — E785 Hyperlipidemia, unspecified: Secondary | ICD-10-CM

## 2017-10-18 DIAGNOSIS — R29898 Other symptoms and signs involving the musculoskeletal system: Secondary | ICD-10-CM

## 2017-10-18 DIAGNOSIS — C61 Malignant neoplasm of prostate: Secondary | ICD-10-CM

## 2017-10-18 DIAGNOSIS — Z23 Encounter for immunization: Secondary | ICD-10-CM

## 2017-10-18 DIAGNOSIS — Z951 Presence of aortocoronary bypass graft: Secondary | ICD-10-CM | POA: Diagnosis not present

## 2017-10-18 NOTE — Assessment & Plan Note (Signed)
DM control

## 2017-10-18 NOTE — Assessment & Plan Note (Signed)
Losartan, Coreg 

## 2017-10-18 NOTE — Assessment & Plan Note (Signed)
Lipitor and ASA; Coreg, Losartan

## 2017-10-18 NOTE — Progress Notes (Signed)
Subjective:  Patient ID: Clinton Kirby, male    DOB: 03-21-1945  Age: 72 y.o. MRN: [id number]  CC: No chief complaint on file.   HPI Clinton Kirby presents for DM, HTN, CAD f/u CBGs 200-300 C/o LE weak on 2 tabs Lipitor (80 mg/d) Outpatient Medications Prior to Visit  Medication Sig Dispense Refill  . aspirin 81 MG tablet Take 81 mg by mouth daily.    [name] atorvastatin (LIPITOR) 40 MG tablet Take 1 tablet (40 mg total) by mouth daily. 90 tablet 3  . b complex vitamins tablet Take 1 tablet by mouth daily. 100 tablet 3  . Blood Glucose Monitoring Suppl (ONETOUCH VERIO IQ SYSTEM) w/Device KIT USE TO CHECK BLOOD SUGAR  DAILY. DX E11.9 1 kit 0  . carvedilol (COREG) 25 MG tablet Take 1 tablet (25 mg total) by mouth 2 (two) times daily. 180 tablet 3  . Cholecalciferol (VITAMIN D3) 2000 units capsule Take 1 capsule (2,000 Units total) by mouth daily. 100 capsule 3  . clotrimazole-betamethasone (LOTRISONE) cream Apply topically 2 (two) times daily. 45 g 1  . gabapentin (NEURONTIN) 300 MG capsule Take 1 capsule (300 mg total) by mouth at bedtime. 90 capsule 3  . glipiZIDE (GLUCOTROL XL) 2.5 MG 24 hr tablet Take 1 tablet (2.5 mg total) by mouth daily with breakfast. 90 tablet 1  . glucose blood (ONETOUCH VERIO) test strip 1 each by Other route daily. Use to check blood sugars twice a day 300 each 1  . losartan (COZAAR) 50 MG tablet Take 1 tablet (50 mg total) by mouth daily. 90 tablet 3  . meclizine (ANTIVERT) 25 MG tablet TAKE 1 TABLET BY MOUTH THREE TIMES DAILY AS NEEDED FOR  DIZZINESS 30 tablet 1  . ONETOUCH DELICA LANCETS FINE MISC Use to help check blood sugars twice a day. Dx E11.9 300 each 1  . SitaGLIPtin-MetFORMIN HCl 50-1000 MG TB24 2 tablets daily at dinner 180 tablet 1  . tamsulosin (FLOMAX) 0.4 MG CAPS capsule Take 1 capsule (0.4 mg total) by mouth daily after supper. 90 capsule 3   Facility-Administered Medications Prior to Visit  Medication Dose Route Frequency Provider Last  Rate Last Dose  . aspirin chewable tablet 81 mg  81 mg Oral Once Martinique, Peter M, MD        ROS: Review of Systems  Constitutional: Negative for appetite change, fatigue and unexpected weight change.  HENT: Negative for congestion, nosebleeds, sneezing, sore throat and trouble swallowing.   Eyes: Negative for itching and visual disturbance.  Respiratory: Negative for cough.   Cardiovascular: Negative for chest pain, palpitations and leg swelling.  Gastrointestinal: Negative for abdominal distention, blood in stool, diarrhea and nausea.  Genitourinary: Negative for frequency and hematuria.  Musculoskeletal: Positive for arthralgias. Negative for back pain, gait problem, joint swelling and neck pain.  Skin: Negative for rash.  Neurological: Negative for dizziness, tremors, speech difficulty and weakness.  Psychiatric/Behavioral: Negative for agitation, dysphoric mood and sleep disturbance. The patient is not nervous/anxious.     Objective:  BP (!) 216/70 (BP Location: Left Arm, Patient Position: Sitting, Cuff Size: Large)   Pulse 88   Temp 98.1 F (36.7 C) (Oral)   Ht 6' (1.829 m)   Wt 259 lb (117.5 kg)   SpO2 98%   BMI 35.13 kg/m   BP Readings from Last 3 Encounters:  10/18/17 (!) 216/70  06/14/17 132/78  04/05/17 130/80    Wt Readings from Last 3 Encounters:  10/18/17 259 lb (117.5  kg)  06/14/17 260 lb (117.9 kg)  04/05/17 258 lb 12.8 oz (117.4 kg)    Physical Exam  Constitutional: He is oriented to person, place, and time. He appears well-developed. No distress.  NAD  HENT:  Mouth/Throat: Oropharynx is clear and moist.  Eyes: Pupils are equal, round, and reactive to light. Conjunctivae are normal.  Neck: Normal range of motion. No JVD present. No thyromegaly present.  Cardiovascular: Normal rate, regular rhythm, normal heart sounds and intact distal pulses. Exam reveals no gallop and no friction rub.  No murmur heard. Pulmonary/Chest: Effort normal and breath  sounds normal. No respiratory distress. He has no wheezes. He has no rales. He exhibits no tenderness.  Abdominal: Soft. Bowel sounds are normal. He exhibits no distension and no mass. There is no tenderness. There is no rebound and no guarding.  Musculoskeletal: Normal range of motion. He exhibits no edema or tenderness.  Lymphadenopathy:    He has no cervical adenopathy.  Neurological: He is alert and oriented to person, place, and time. He has normal reflexes. No cranial nerve deficit. He exhibits normal muscle tone. He displays a negative Romberg sign. Coordination and gait normal.  Skin: Skin is warm and dry. No rash noted.  Psychiatric: He has a normal mood and affect. His behavior is normal. Judgment and thought content normal.   LEs are weak a little Obese  Lab Results  Component Value Date   WBC 7.6 11/09/2014   HGB 16.7 11/09/2014   HCT 49.5 11/09/2014   PLT 303.0 11/09/2014   GLUCOSE 155 (H) 05/14/2017   CHOL 175 04/02/2017   TRIG 210.0 (H) 04/02/2017   HDL 45.40 04/02/2017   LDLDIRECT 106.0 05/14/2017   LDLCALC 49 01/31/2016   ALT 15 04/02/2017   AST 13 04/02/2017   NA 138 05/14/2017   K 4.9 05/14/2017   CL 103 05/14/2017   CREATININE 0.99 05/14/2017   BUN 19 05/14/2017   CO2 28 05/14/2017   TSH 0.72 11/09/2014   PSA 41.85 (H) 06/25/2015   HGBA1C 7.8 04/05/2017   MICROALBUR 6.3 (H) 05/14/2017    No results found.  Assessment & Plan:   There are no diagnoses linked to this encounter.   No orders of the defined types were placed in this encounter.    Follow-up: No follow-ups on file.  Walker Kehr, MD

## 2017-10-18 NOTE — Progress Notes (Signed)
Cardiology Office Note    Date:  10/21/2017   ID:  RONIE Kirby, DOB 15-Apr-1945, MRN [id number]  PCP:  [name], MD  Cardiologist:  [name], MD    History of Present Illness:  Clinton Kirby is a 72 y.o. male with known history of coronary disease and  poorly controlled diabetes mellitus type 2 as well as hyperlipidemia, hypertension and peripheral neuropathy.  He is status post inferior myocardial infarction in February of 2004 while in [place]. This was treated with a drug-eluting stent to the right coronary. In 2005 he had some abnormality on a stress test and underwent repeat cardiac catheterization. This showed the stent to be widely patent and he had no other significant disease. Stress test here was in July 2014 showed a fixed inferior wall scar and mild LV dysfunction.   He is  s/p CABG in [facility] on 11/11/15. H was admitted  with chest pain. He ruled out for MI.  He  had an abnormal Myoview showing inferior and apical scar with ischemia in the anterior wall and apex. EF 28%.  He underwent cardiac cath 11/06/15. His cath showed a patent RCA stent but an occluded LAD and 70% stenosis in the OM1 and OM2.  He underwent CABG x 1 with LIMA-LAD 11/11/15 by Dr [name]. The OMs were felt to be too small for bypass. Post OP echo 11/13/15 showed his EF to be 40%. Carotid dopplers showed no obstructive disease. It was noted on cardiac cath that his right innominate artery was very tortuous.   Cath films reviewed personally by me:  11/07/15. This showed occlusion of the proximal LAD with some collateral. There was a large ramus vessel that bifurcated in the mid vessel. There was 40-50% disease at the bifurcation. The first OM was a moderate sized vessel with 80-90% stenosis. The second OM was smaller with a 70% stenosis. The RCA was patent in the mid vessel at site of prior stent. There was diffuse 40% disease in the proximal vessel. It is surprising that the OMs  were too small for bypass. If he has refractory angina could be considered for PCI of OM1. On his last visit losartan was added for LV dysfunction.  On follow up today he is doing well from a cardiac standpoint. Denies dyspnea or chest pain. Main limitation is leg weakness. States he just can't walk like he did before he had radiation treatment for his prostate. Has numbness in his feet. LE arterial dopplers in March this year were OK. Dr. Alain Marion held atorvastatin to see if this will help. BS have been running high and he increased Janumet to 3 tablets daily.  Past Medical History:  Diagnosis Date  . Balanitis   . CAD (coronary artery disease)   . Depression   . Diabetes mellitus type II   . ED (erectile dysfunction)   . HTN (hypertension)   . Hyperlipidemia   . Old MI (myocardial infarction) 12/07/2013  . Prostate cancer [facility])     Past Surgical History:  Procedure Laterality Date  . CORONARY ARTERY BYPASS GRAFT  10/2015  . PROSTATE BIOPSY    . UMBILICAL HERNIA REPAIR      Current Medications: Outpatient Medications Prior to Visit  Medication Sig Dispense Refill  . aspirin 81 MG tablet Take 81 mg by mouth daily.    [name] b complex vitamins tablet Take 1 tablet by mouth daily. 100 tablet 3  . Blood Glucose Monitoring Suppl [facility]  VERIO IQ SYSTEM) w/Device KIT USE TO CHECK BLOOD SUGAR  DAILY. DX E11.9 1 kit 0  . carvedilol (COREG) 25 MG tablet Take 1 tablet (25 mg total) by mouth 2 (two) times daily. 180 tablet 3  . Cholecalciferol (VITAMIN D3) 2000 units capsule Take 1 capsule (2,000 Units total) by mouth daily. 100 capsule 3  . clotrimazole-betamethasone (LOTRISONE) cream Apply topically 2 (two) times daily. 45 g 1  . gabapentin (NEURONTIN) 300 MG capsule Take 1 capsule (300 mg total) by mouth at bedtime. 90 capsule 3  . glipiZIDE (GLUCOTROL XL) 2.5 MG 24 hr tablet Take 1 tablet (2.5 mg total) by mouth daily with breakfast. 90 tablet 1  . glucose blood (ONETOUCH VERIO) test strip  1 each by Other route daily. Use to check blood sugars twice a day 300 each 1  . losartan (COZAAR) 50 MG tablet Take 1 tablet (50 mg total) by mouth daily. 90 tablet 3  . meclizine (ANTIVERT) 25 MG tablet TAKE 1 TABLET BY MOUTH THREE TIMES DAILY AS NEEDED FOR  DIZZINESS 30 tablet 1  . ONETOUCH DELICA LANCETS FINE MISC Use to help check blood sugars twice a day. Dx E11.9 300 each 1  . SitaGLIPtin-MetFORMIN HCl 50-1000 MG TB24 2 tablets daily at dinner 180 tablet 1  . tamsulosin (FLOMAX) 0.4 MG CAPS capsule Take 1 capsule (0.4 mg total) by mouth daily after supper. 90 capsule 3  . atorvastatin (LIPITOR) 40 MG tablet Take 1 tablet (40 mg total) by mouth daily. (Patient not taking: Reported on 10/21/2017) 90 tablet 3   Facility-Administered Medications Prior to Visit  Medication Dose Route Frequency Provider Last Rate Last Dose  . aspirin chewable tablet 81 mg  81 mg Oral Once Martinique, Peter M, MD         Allergies:   Vania Rea [empagliflozin]   Social History   Socioeconomic History  . Marital status: Married    Spouse name: Not on file  . Number of children: 1  . Years of education: Not on file  . Highest education level: Not on file  Occupational History  . Occupation: truck driver-self employed  Social Needs  . Financial resource strain: Not on file  . Food insecurity:    Worry: Not on file    Inability: Not on file  . Transportation needs:    Medical: Not on file    Non-medical: Not on file  Tobacco Use  . Smoking status: Never Smoker  . Smokeless tobacco: Never Used  Substance and Sexual Activity  . Alcohol use: No    Alcohol/week: 0.0 standard drinks  . Drug use: No  . Sexual activity: Yes  Lifestyle  . Physical activity:    Days per week: Not on file    Minutes per session: Not on file  . Stress: Not on file  Relationships  . Social connections:    Talks on phone: Not on file    Gets together: Not on file    Attends religious service: Not on file    Active member  of club or organization: Not on file    Attends meetings of clubs or organizations: Not on file    Relationship status: Not on file  Other Topics Concern  . Not on file  Social History Narrative  . Not on file     Family History:  The patient's family history includes Cancer in his father; Diabetes in his mother and other.   ROS:   Please see the history of  present illness.    ROS All other systems reviewed and are negative.   PHYSICAL EXAM:   VS:  BP 130/62 (BP Location: Right Arm, Patient Position: Sitting)   Pulse 88   Ht 6' (1.829 m)   Wt 257 lb 6.4 oz (116.8 kg)   BMI 34.91 kg/m    GENERAL:  Well appearing overweight BM in NAD HEENT:  PERRL, EOMI, sclera are clear. Oropharynx is clear. NECK:  No jugular venous distention, carotid upstroke brisk and symmetric, no bruits, no thyromegaly or adenopathy LUNGS:  Clear to auscultation bilaterally CHEST:  Unremarkable HEART:  RRR,  PMI not displaced or sustained,S1 and S2 within normal limits, no S3, no S4: no clicks, no rubs, no murmurs ABD:  Soft, nontender. BS +, no masses or bruits. No hepatomegaly, no splenomegaly EXT:  2 + pulses throughout, no edema, no cyanosis no clubbing SKIN:  Warm and dry.  No rashes NEURO:  Alert and oriented x 3. Cranial nerves II through XII intact. PSYCH:  Cognitively intact      Wt Readings from Last 3 Encounters:  10/21/17 257 lb 6.4 oz (116.8 kg)  10/18/17 259 lb (117.5 kg)  06/14/17 260 lb (117.9 kg)      Studies/Labs Reviewed:   EKG:  EKG is not ordered today.   Recent Labs: 04/02/2017: ALT 15 05/14/2017: BUN 19; Creatinine, Ser 0.99; Potassium 4.9; Sodium 138  Dated 07/19/17: A1c 8.1%.   Dated 08/05/17 A1c 9.1%  Lipid Panel    Component Value Date/Time   CHOL 175 04/02/2017 0955   TRIG 210.0 (H) 04/02/2017 0955   HDL 45.40 04/02/2017 0955   CHOLHDL 4 04/02/2017 0955   VLDL 42.0 (H) 04/02/2017 0955   LDLCALC 49 01/31/2016 1411   LDLDIRECT 106.0 05/14/2017 1025     Additional studies/ records that were reviewed today include:  Echo 05/07/16: Study Conclusions  - Left ventricle: Septal apical and inferobasal hypokinesis The   cavity size was normal. Wall thickness was increased in a pattern   of mild LVH. Systolic function was mildly to moderately reduced.   The estimated ejection fraction was in the range of 40% to 45%.   Wall motion was normal; there were no regional wall motion   abnormalities. Left ventricular diastolic function parameters   were normal. - Atrial septum: No defect or patent foramen ovale was identified.   ASSESSMENT:    1. CAD S/P percutaneous coronary angioplasty   2. CABG x 3 11/11/15 in [facility]   3. Chronic systolic heart failure ([place])   4. Essential hypertension   5. Hypercholesterolemia      PLAN:  In order of problems listed above:  1. Patient is s/p CABG. Old stent in RCA. He is asymptomatic. Continue current therapy.  Continue ASA, beta blocker.  2. Chronic systolic CHF. Last EF was  40%. He is asymptomatic. Will continue Coreg  25 mg bid. And losartan 50 mg daily. 3. Dyslipidemia  Goal LDL < 70. Statins currently on hold to see if leg weakness improves. I suspect this is more a problem of neuropathy. If symptoms do not improve off atorvastatin for one month I would resume. 4. Prostate CA-s/p RT 5.   DM type 2 with neuropathy. Per endocrinology.  Medication Adjustments/Labs and Tests Ordered: Current medicines are reviewed at length with the patient today.  Concerns regarding medicines are outlined above.  Medication changes, Labs and Tests ordered today are listed in the Patient Instructions below. Patient Instructions  Hold atorvastatin (lipitor)  for one month- if your legs get better we will need to consider alternative medication for cholesterol If your legs do not improve then it is not the medication and you should resume  Continue your other therapy  I will see you in 6 months       Signed, [name], MD  10/21/2017 [place] [address] [place], [place], [address] [phone]

## 2017-10-18 NOTE — Assessment & Plan Note (Signed)
C/o LE weak on 2 tabs Lipitor (80 mg/d) Hold Rx x 2 -3 wks, then re-start at 1 a day

## 2017-10-18 NOTE — Assessment & Plan Note (Signed)
D/c Jardiance - syncope F/u w/Dr Chalmers Cater

## 2017-10-18 NOTE — Assessment & Plan Note (Signed)
Lupron, XRT seeds

## 2017-10-19 DIAGNOSIS — E083313 Diabetes mellitus due to underlying condition with moderate nonproliferative diabetic retinopathy with macular edema, bilateral: Secondary | ICD-10-CM | POA: Diagnosis not present

## 2017-10-21 ENCOUNTER — Encounter: Payer: Self-pay | Admitting: Cardiology

## 2017-10-21 ENCOUNTER — Ambulatory Visit: Payer: Medicare Other | Admitting: Cardiology

## 2017-10-21 VITALS — BP 130/62 | HR 88 | Ht 72.0 in | Wt 257.4 lb

## 2017-10-21 DIAGNOSIS — Z951 Presence of aortocoronary bypass graft: Secondary | ICD-10-CM

## 2017-10-21 DIAGNOSIS — E7800 Pure hypercholesterolemia, unspecified: Secondary | ICD-10-CM | POA: Diagnosis not present

## 2017-10-21 DIAGNOSIS — Z9861 Coronary angioplasty status: Secondary | ICD-10-CM

## 2017-10-21 DIAGNOSIS — I5022 Chronic systolic (congestive) heart failure: Secondary | ICD-10-CM

## 2017-10-21 DIAGNOSIS — I10 Essential (primary) hypertension: Secondary | ICD-10-CM

## 2017-10-21 DIAGNOSIS — I2510 Atherosclerotic heart disease of native coronary artery without angina pectoris: Secondary | ICD-10-CM | POA: Diagnosis not present

## 2017-10-21 NOTE — Patient Instructions (Signed)
Hold atorvastatin (lipitor) for one month- if your legs get better we will need to consider alternative medication for cholesterol If your legs do not improve then it is not the medication and you should resume  Continue your other therapy  I will see you in 6 months

## 2017-11-01 DIAGNOSIS — E1165 Type 2 diabetes mellitus with hyperglycemia: Secondary | ICD-10-CM | POA: Diagnosis not present

## 2017-11-01 DIAGNOSIS — E1140 Type 2 diabetes mellitus with diabetic neuropathy, unspecified: Secondary | ICD-10-CM | POA: Diagnosis not present

## 2017-11-01 DIAGNOSIS — I10 Essential (primary) hypertension: Secondary | ICD-10-CM | POA: Diagnosis not present

## 2017-11-03 IMAGING — NM NM BONE WHOLE BODY
2 series · 2 of 2 positions shown · non-contrast
Comparison: CT of the abdomen and pelvis on 02/07/2016

CLINICAL DATA: Prostate cancer, elevated PSA 37.4 09/24/15, LT
shoulder pain, CABG 2 months ago +DM

EXAM:
NUCLEAR MEDICINE WHOLE BODY BONE SCAN
TECHNIQUE: Whole body anterior and posterior images were obtained approximately
3 hours after intravenous injection of radiopharmaceutical.
RADIOPHARMACEUTICALS:  20.9 mCi Qechnetium-TTm MDP IV

[Series 1: wbr_bone_40 whole body · 2.66mm/px · 1 of 1 slices shown (1 of 2)]
[im 1/1]
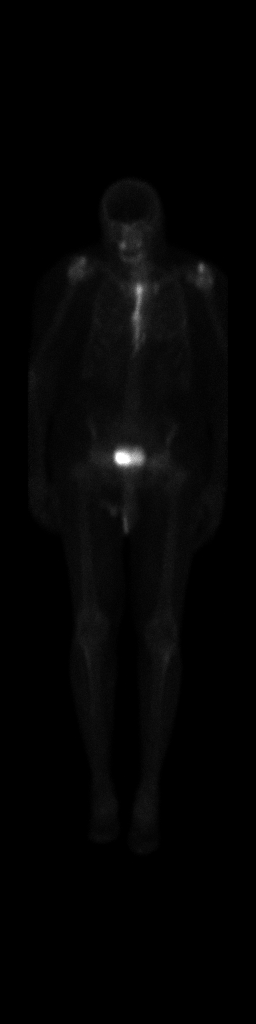

[Series 1: wbr_bone_40 whole body · 2.66mm/px · 1 of 1 slices shown (2 of 2)]
[im 1/1]
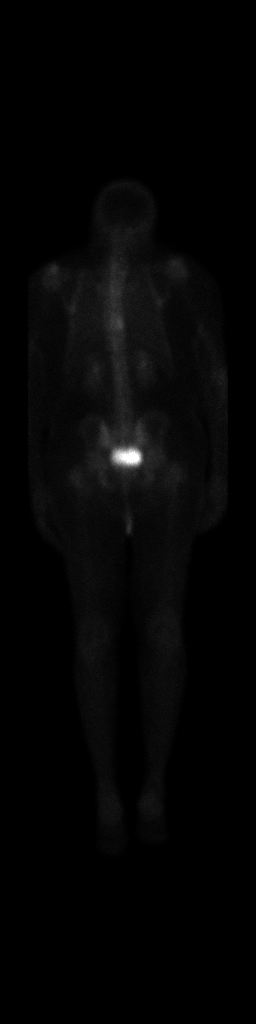

[2 of 2 positions shown; findings below may reference images not displayed]

FINDINGS: Bilateral renal activity and bladder activity are present. Sternal
activity is consistent with prior median sternotomy. Focal activity
is present within both shoulders, left greater than right likely
degenerative. There is maxillary activity, right greater than left
likely related to benign dental disease. Mid to lower thoracic
activity, likely degenerative.

No suspicious osseous remodeling to indicate presence of metastatic
disease.
IMPRESSION: 1. No suspicious activity to indicate osseous metastatic disease.
2. Probable degenerative change in the left shoulder, thoracic
spine.

## 2017-11-30 ENCOUNTER — Other Ambulatory Visit: Payer: Self-pay

## 2017-11-30 NOTE — Patient Outreach (Signed)
[facility]) Care Management  11/30/2017  Clinton Kirby [birth year] [id number]   Medication Adherence call to [name] left a message for patient to call back patient is due on Losartan 50 mg. [name] is showing past due under [place].   [place] Management Direct Dial [phone]  Fax [phone] [name]

## 2017-12-08 DIAGNOSIS — H3582 Retinal ischemia: Secondary | ICD-10-CM | POA: Diagnosis not present

## 2017-12-08 DIAGNOSIS — H2513 Age-related nuclear cataract, bilateral: Secondary | ICD-10-CM | POA: Diagnosis not present

## 2017-12-08 DIAGNOSIS — E113513 Type 2 diabetes mellitus with proliferative diabetic retinopathy with macular edema, bilateral: Secondary | ICD-10-CM | POA: Diagnosis not present

## 2017-12-08 LAB — HM DIABETES EYE EXAM

## 2017-12-16 ENCOUNTER — Other Ambulatory Visit: Payer: Self-pay

## 2017-12-16 MED ORDER — GLIPIZIDE ER 2.5 MG PO TB24: 2.5000 mg | ORAL_TABLET | Freq: Every day | ORAL | 3 refills | Status: DC

## 2017-12-21 DIAGNOSIS — E113513 Type 2 diabetes mellitus with proliferative diabetic retinopathy with macular edema, bilateral: Secondary | ICD-10-CM | POA: Diagnosis not present

## 2017-12-23 ENCOUNTER — Telehealth: Payer: Self-pay | Admitting: Internal Medicine

## 2017-12-23 NOTE — Telephone Encounter (Signed)
Copied from [place] [phone] [phone]. Topic: Quick Communication - Rx Refill/Question >> Dec 23, 2017  1:21 PM Alfredia Ferguson R wrote: Medication:SitaGLIPtin-MetFORMIN HCl 50-1000 MG TB24  Has the patient contacted their pharmacy? Yes  Preferred Pharmacy (with phone number or street name): [place], [place] [phone] (Phone) [phone] (Fax)    Pt wants to know if he can be switched to something else because he cant afford the med. Insurance advised him the med is $300

## 2017-12-23 NOTE — Telephone Encounter (Signed)
Please advise 

## 2017-12-24 ENCOUNTER — Encounter: Payer: Self-pay | Admitting: Internal Medicine

## 2017-12-26 MED ORDER — METFORMIN HCL 1000 MG PO TABS: 1000.0000 mg | ORAL_TABLET | Freq: Two times a day (BID) | ORAL | 3 refills | Status: DC

## 2017-12-26 NOTE — Telephone Encounter (Signed)
I changed it to Metformin Pls keep ROV Thx

## 2017-12-28 DIAGNOSIS — G629 Polyneuropathy, unspecified: Secondary | ICD-10-CM | POA: Diagnosis not present

## 2018-01-10 ENCOUNTER — Ambulatory Visit: Payer: Self-pay

## 2018-01-10 NOTE — Telephone Encounter (Signed)
ret'd call to pt.  Reported he got Metformin from [facility]. On Friday.  Is asking if Dr. Alain Marion wants him to take Janumet?  Advised that according to the phone note of 12/26/17, the Janumet was changed to Metformin, due to the expense of the Janumet.  Advised that the pt. should be taking only the Metformin and Glipizide for his DM, and to keep his f/u appt. in March, with Dr. Alain Marion.  Pt. Verb. Understanding.        Reason for Disposition . Health Information question, no triage required and triager able to answer question  Answer Assessment - Initial Assessment Questions 1. REASON FOR CALL or QUESTION: "What is your reason for calling today?" or "How can I best help you?" or "What question do you have that I can help answer?"     Asking if he is supposed to be taking Janumet?  Protocols used: INFORMATION ONLY CALL-A-AH  Message from [facility] sent at 01/10/2018 11:19 AM EST   Pt stated he was taking both Metformin and SitaGLIPtin-MetFORMIN HCl (JANUMET XR) [phone] MG but when he went to pick up his prescription from the pharmacy there was no prescription for the Janumet. Pt requests a call back. Cb# [phone]

## 2018-01-14 ENCOUNTER — Other Ambulatory Visit: Payer: Self-pay

## 2018-01-14 NOTE — Patient Outreach (Signed)
[place] [facility]) Care Management  01/14/2018  Clinton Kirby [birth year] [id number]   Medication Adherence call to Clinton Kirby patient did not answer patient is due on Atorvastatin 40 mg. Clinton Kirby is showing past due under [place].   [place] Management Direct Dial [phone]  Fax [phone] [email or web address]

## 2018-01-18 DIAGNOSIS — E113513 Type 2 diabetes mellitus with proliferative diabetic retinopathy with macular edema, bilateral: Secondary | ICD-10-CM | POA: Diagnosis not present

## 2018-01-18 DIAGNOSIS — H2513 Age-related nuclear cataract, bilateral: Secondary | ICD-10-CM | POA: Diagnosis not present

## 2018-03-01 DIAGNOSIS — H2513 Age-related nuclear cataract, bilateral: Secondary | ICD-10-CM | POA: Diagnosis not present

## 2018-03-01 DIAGNOSIS — E113513 Type 2 diabetes mellitus with proliferative diabetic retinopathy with macular edema, bilateral: Secondary | ICD-10-CM | POA: Diagnosis not present

## 2018-03-31 DIAGNOSIS — G629 Polyneuropathy, unspecified: Secondary | ICD-10-CM | POA: Diagnosis not present

## 2018-04-18 ENCOUNTER — Other Ambulatory Visit (INDEPENDENT_AMBULATORY_CARE_PROVIDER_SITE_OTHER): Payer: Medicare Other

## 2018-04-18 ENCOUNTER — Ambulatory Visit: Payer: Medicare Other | Admitting: Internal Medicine

## 2018-04-18 ENCOUNTER — Encounter: Payer: Self-pay | Admitting: Nurse Practitioner

## 2018-04-18 ENCOUNTER — Other Ambulatory Visit: Payer: Self-pay | Admitting: Internal Medicine

## 2018-04-18 ENCOUNTER — Ambulatory Visit: Payer: Self-pay | Admitting: *Deleted

## 2018-04-18 ENCOUNTER — Ambulatory Visit (INDEPENDENT_AMBULATORY_CARE_PROVIDER_SITE_OTHER): Payer: Medicare Other | Admitting: Nurse Practitioner

## 2018-04-18 VITALS — BP 170/90 | HR 92 | Ht 72.0 in | Wt 253.0 lb

## 2018-04-18 DIAGNOSIS — R42 Dizziness and giddiness: Secondary | ICD-10-CM

## 2018-04-18 LAB — COMPREHENSIVE METABOLIC PANEL
ALT: 20 U/L (ref 0–53)
AST: 16 U/L (ref 0–37)
Albumin: 4.4 g/dL (ref 3.5–5.2)
Alkaline Phosphatase: 51 U/L (ref 39–117)
BUN: 17 mg/dL (ref 6–23)
CO2: 26 mEq/L (ref 19–32)
Calcium: 9.6 mg/dL (ref 8.4–10.5)
Chloride: 103 mEq/L (ref 96–112)
Creatinine, Ser: 1.03 mg/dL (ref 0.40–1.50)
GFR: 86.18 mL/min (ref >60.00)
Glucose, Bld: 291 mg/dL — ABNORMAL HIGH (ref 70–99)
Potassium: 4.1 mEq/L (ref 3.5–5.1)
Sodium: 137 mEq/L (ref 135–145)
Total Bilirubin: 0.6 mg/dL (ref 0.2–1.2)
Total Protein: 7 g/dL (ref 6.0–8.3)

## 2018-04-18 LAB — CBC
HCT: 41.2 % (ref 39.0–52.0)
Hemoglobin: 13.9 g/dL (ref 13.0–17.0)
MCHC: 33.8 g/dL (ref 30.0–36.0)
MCV: 87.7 fl (ref 78.0–100.0)
Platelets: 252 10*3/uL (ref 150.0–400.0)
RBC: 4.7 Mil/uL (ref 4.22–5.81)
RDW: 14.6 % (ref 11.5–15.5)
WBC: 6.6 10*3/uL (ref 4.0–10.5)

## 2018-04-18 LAB — VITAMIN B12: Vitamin B-12: 538 pg/mL (ref 211–911)

## 2018-04-18 LAB — TSH: TSH: 1.91 u[IU]/mL (ref 0.35–4.50)

## 2018-04-18 MED ORDER — MECLIZINE HCL 25 MG PO TABS: ORAL_TABLET | ORAL | 0 refills | Status: DC

## 2018-04-18 NOTE — Telephone Encounter (Signed)
Copied from [place] [id number]. Topic: Quick Communication - Rx Refill/Question >> Apr 18, 2018  9:02 AM Blase Mess A wrote: Medication: meclizine (ANTIVERT) 25 MG tablet [[id number]]   Has the patient contacted their pharmacy? Yes  (Agent: If no, request that the patient contact the pharmacy for the refill.) (Agent: If yes, when and what did the pharmacy advise?)  Preferred Pharmacy (with phone number or street name): [facility] [address] [place], [place] [address]  Agent: Please be advised that RX refills may take up to 3 business days. We ask that you follow-up with your pharmacy.

## 2018-04-18 NOTE — Telephone Encounter (Signed)
Sent 30 day supply per chart pt is due for annual appt.Marland KitchenJohny Kirby

## 2018-04-18 NOTE — Progress Notes (Signed)
Clinton Kirby is a 73 y.o. male with the following history as recorded in EpicCare:  Patient Active Problem List   Diagnosis Date Noted  . Leg weakness, bilateral 10/18/2017  . Prostate cancer ([place]) 01/31/2016  . Stress and adjustment reaction 01/31/2016  . Cardiomyopathy, ischemic 12/04/2015  . Atherosclerotic heart disease of native coronary artery with unspecified angina pectoris ([place]) 11/14/2015  . Presence of aortocoronary bypass graft 11/14/2015  . CAD S/P percutaneous coronary angioplasty 11/06/2015  . Chest pain 11/05/2015  . Diabetic polyneuropathy associated with type 2 diabetes mellitus ([place]) 11/05/2015  . Hypercholesterolemia 11/05/2015  . Type 2 diabetes mellitus with hyperglycemia ([place]) 11/05/2015  . Finger pain, left 06/25/2015  . Well adult exam 11/09/2014  . Onychomycosis 10/08/2014  . Diabetic neuropathy associated with diabetes mellitus due to underlying condition ([place]) 10/08/2014  . Old MI (myocardial infarction) 12/07/2013  . Fatigue 05/18/2011  . Diabetes mellitus type 2 with neurological manifestations ([place]) 05/18/2011  . Dyslipidemia 01/04/2011  . CONSTIPATION 02/22/2008  . Essential hypertension 09/30/2006  . CABG x 3 11/11/15 in [facility] 09/30/2006  . UMBILICAL HERNIORRHAPHY, HX OF 09/30/2006    Current Outpatient Medications  Medication Sig Dispense Refill  . aspirin 81 MG tablet Take 81 mg by mouth daily.    [name] atorvastatin (LIPITOR) 40 MG tablet Take 1 tablet (40 mg total) by mouth daily. 90 tablet 3  . b complex vitamins tablet Take 1 tablet by mouth daily. 100 tablet 3  . Blood Glucose Monitoring Suppl (ONETOUCH VERIO IQ SYSTEM) w/Device KIT USE TO CHECK BLOOD SUGAR  DAILY. DX E11.9 1 kit 0  . carvedilol (COREG) 25 MG tablet Take 1 tablet (25 mg total) by mouth 2 (two) times daily. 180 tablet 3  . Cholecalciferol (VITAMIN D3) 2000 units capsule Take 1 capsule (2,000 Units total) by mouth daily. 100 capsule 3  . clotrimazole-betamethasone  (LOTRISONE) cream Apply topically 2 (two) times daily. 45 g 1  . gabapentin (NEURONTIN) 300 MG capsule Take 1 capsule (300 mg total) by mouth at bedtime. 90 capsule 3  . glucose blood (ONETOUCH VERIO) test strip 1 each by Other route daily. Use to check blood sugars twice a day 300 each 1  . losartan (COZAAR) 50 MG tablet Take 1 tablet (50 mg total) by mouth daily. 90 tablet 3  . meclizine (ANTIVERT) 25 MG tablet TAKE 1 TABLET BY MOUTH THREE TIMES DAILY AS NEEDED FOR  DIZZINESS 20 tablet 0  . metFORMIN (GLUCOPHAGE) 1000 MG tablet Take 1 tablet (1,000 mg total) by mouth 2 (two) times daily with a meal. 180 tablet 3  . ONETOUCH DELICA LANCETS FINE MISC Use to help check blood sugars twice a day. Dx E11.9 300 each 1  . tamsulosin (FLOMAX) 0.4 MG CAPS capsule Take 1 capsule (0.4 mg total) by mouth daily after supper. 90 capsule 3  . glipiZIDE (GLUCOTROL XL) 2.5 MG 24 hr tablet Take 1 tablet (2.5 mg total) by mouth daily with breakfast. (Patient not taking: Reported on 04/18/2018) 90 tablet 3   Current Facility-Administered Medications  Medication Dose Route Frequency Provider Last Rate Last Dose  . aspirin chewable tablet 81 mg  81 mg Oral Once Martinique, Peter M, MD        Allergies: Clinton Kirby [empagliflozin]  Past Medical History:  Diagnosis Date  . Balanitis   . CAD (coronary artery disease)   . Depression   . Diabetes mellitus type II   . ED (erectile dysfunction)   . HTN (hypertension)   .  Hyperlipidemia   . Old MI (myocardial infarction) 12/07/2013  . Prostate cancer [facility])     Past Surgical History:  Procedure Laterality Date  . CORONARY ARTERY BYPASS GRAFT  10/2015  . PROSTATE BIOPSY    . UMBILICAL HERNIA REPAIR      Family History  Problem Relation Age of Onset  . Diabetes Mother   . Cancer Father        ?  . Diabetes Other     Social History   Tobacco Use  . Smoking status: Never Smoker  . Smokeless tobacco: Never Used  Substance Use Topics  . Alcohol use: No     Alcohol/week: 0.0 standard drinks     Subjective:  Clinton Kirby is here today requesting evaluation of acute complaint of dizziness, which first began last Thursday, noticing the dizziness every time he changes positions, such as when he lies down in the bed or when he stands up from lying, the dizziness lasts for a few minutes after positional change then resolves He says This reminds him of when he had vertigo in 2018, he was given meclizine which helped, but the meclizine is now outdated, hes actually not had to take it for at least a year, called for a refill but was told to come in for an office visit for a refill. He denies fevers, chills, headaches, confusion, speech or vision changes, unilateral weakness ,cp, sob, palpitations, edema. His blood pressure is elevated, he has only taken 1 of his coreg pills today, plans to take the other one this evening. He has already taken his losartan today He reports recent glucose readings have been >200, has not been to his endocrinologist recently. Has see ENT for vertigo in the past.  BP Readings from Last 3 Encounters:  04/18/18 (!) 170/90  10/21/17 130/62  10/18/17 (!) 216/70   ROS- See HPI  Objective:  Vitals:   04/18/18 1545  BP: (!) 170/90  Pulse: 92  SpO2: 96%  Weight: 253 lb (114.8 kg)  Height: 6' (1.829 Kirby)    General: Well developed, well nourished, in no acute distress  Skin : Warm and dry.  Head: Normocephalic and atraumatic  Eyes: Sclera and conjunctiva clear; pupils round and reactive to light; extraocular movements intact  Ears: External normal; canals clear; tympanic membranes normal  Oropharynx: Pink, supple. No suspicious lesions  Neck: Supple without thyromegaly, adenopathy  Lungs: Respirations unlabored; clear to auscultation bilaterally without wheeze, rales, rhonchi  CVS exam: normal rate, regular rhythm, normal S1, S2, no murmurs, rubs, clicks or gallops.  Extremities: No edema, cyanosis, clubbing  Vessels:  Symmetric bilaterally  Neurologic: Alert and oriented; speech intact; face symmetrical; moves all extremities well; CNII-XII intact without focal deficit  Psychiatric: Normal mood and affect.  Assessment:  1. Dizziness     Plan:   Aside from elevated BP, PE is normal today, no neuro deficits noted Orthostatics unremarkable Labs ordered for further evaluation Meclizine rx given- med dosing, side effects discussed Home management, red flags and strict return precautions including when to seek immediate/emergency care discussed and printed on AVS RTC in 1 week with PCP for follow up  Return in about 1 week (around 04/25/2018) for follow up.  Orders Placed This Encounter  Procedures  . TSH    Standing Status:   Future    Number of Occurrences:   1    Standing Expiration Date:   04/18/2019  . CBC    Standing Status:   Future  Number of Occurrences:   1    Standing Expiration Date:   04/18/2019  . Comprehensive metabolic panel    Standing Status:   Future    Number of Occurrences:   1    Standing Expiration Date:   04/18/2019  . Vitamin B12    Standing Status:   Future    Number of Occurrences:   1    Standing Expiration Date:   04/18/2019    Requested Prescriptions   Signed Prescriptions Disp Refills  . meclizine (ANTIVERT) 25 MG tablet 20 tablet 0    Sig: TAKE 1 TABLET BY MOUTH THREE TIMES DAILY AS NEEDED FOR  DIZZINESS

## 2018-04-18 NOTE — Telephone Encounter (Signed)
Pt reports dizziness, onset Thursday. States "Room spinning." Positional, worse with sitting to standing. States resolves after sitting down. Reports H/O vertigo 2018. "THey gave me some pills that helped." States is staying hydrated. Denies headache, visual changes, weakness. Does report "Runny nose." Denies earache. Appt made for today with A. Shambley. [place] called practice and spoke with Almyra Free in regards to type and  length of appt (15 minutes.) Approval to secure appt. Care advise given per protocol. Wife will drive.  Reason for Disposition . [1] MODERATE dizziness (e.g., vertigo; feels very unsteady, interferes with normal activities) AND [2] has NOT been evaluated by physician for this    "Vertigo" 2018  Answer Assessment - Initial Assessment Questions 1. DESCRIPTION: "Describe your dizziness."     Spinning 2. VERTIGO: "Do you feel like either you or the room is spinning or tilting?"      yes 3. LIGHTHEADED: "Do you feel lightheaded?" (e.g., somewhat faint, woozy, weak upon standing)     no 4. SEVERITY: "How bad is it?"  "Can you walk?"   - MILD - Feels unsteady but walking normally.   - MODERATE - Feels very unsteady when walking, but not falling; interferes with normal activities (e.g., school, work) .   - SEVERE - Unable to walk without falling (requires assistance).     moderate 5. ONSET:  "When did the dizziness begin?"     Thursday 6. AGGRAVATING FACTORS: "Does anything make it worse?" (e.g., standing, change in head position)     postitional 7. CAUSE: "What do you think is causing the dizziness?"     Feels vertigo in past, feels like that 8. RECURRENT SYMPTOM: "Have you had dizziness before?" If so, ask: "When was the last time?" "What happened that time?"     2018 9. OTHER SYMPTOMS: "Do you have any other symptoms?" (e.g., headache, weakness, numbness, vomiting, earache)     Runny nose  Protocols used: DIZZINESS - VERTIGO-A-AH

## 2018-04-18 NOTE — Patient Instructions (Signed)
Head downstairs for labs   Dizziness Dizziness is a common problem. It makes you feel unsteady or light-headed. You may feel like you are about to pass out (faint). Dizziness can lead to getting hurt if you stumble or fall. Dizziness can be caused by many things, including:  Medicines.  Not having enough water in your body (dehydration).  Illness. Follow these instructions at home: Eating and drinking   Drink enough fluid to keep your pee (urine) clear or pale yellow. This helps to keep you from getting dehydrated. Try to drink more clear fluids, such as water.  Do not drink alcohol.  Limit how much caffeine you drink or eat, if your doctor tells you to do that.  Limit how much salt (sodium) you drink or eat, if your doctor tells you to do that. Activity   Avoid making quick movements. ? When you stand up from sitting in a chair, steady yourself until you feel okay. ? In the morning, first sit up on the side of the bed. When you feel okay, stand slowly while you hold onto something. Do this until you know that your balance is fine.  If you need to stand in one place for a long time, move your legs often. Tighten and relax the muscles in your legs while you are standing.  Do not drive or use heavy machinery if you feel dizzy.  Avoid bending down if you feel dizzy. Place items in your home so you can reach them easily without leaning over. Lifestyle  Do not use any products that contain nicotine or tobacco, such as cigarettes and e-cigarettes. If you need help quitting, ask your doctor.  Try to lower your stress level. You can do this by using methods such as yoga or meditation. Talk with your doctor if you need help. General instructions  Watch your dizziness for any changes.  Take over-the-counter and prescription medicines only as told by your doctor. Talk with your doctor if you think that you are dizzy because of a medicine that you are taking.  Tell a friend or a  family member that you are feeling dizzy. If he or she notices any changes in your behavior, have this person call your doctor.  Keep all follow-up visits as told by your doctor. This is important. Contact a doctor if:  Your dizziness does not go away.  Your dizziness or light-headedness gets worse.  You feel sick to your stomach (nauseous).  You have trouble hearing.  You have new symptoms.  You are unsteady on your feet.  You feel like the room is spinning. Get help right away if:  You throw up (vomit) or have watery poop (diarrhea), and you cannot eat or drink anything.  You have trouble: ? Talking. ? Walking. ? Swallowing. ? Using your arms, hands, or legs.  You feel generally weak.  You are not thinking clearly, or you have trouble forming sentences. A friend or family member may notice this.  You have: ? Chest pain. ? Pain in your belly (abdomen). ? Shortness of breath. ? Sweating.  Your vision changes.  You are bleeding.  You have a very bad headache.  You have neck pain or a stiff neck.  You have a fever. These symptoms may be an emergency. Do not wait to see if the symptoms will go away. Get medical help right away. Call your local emergency services (911 in the [place] Do not drive yourself to the hospital. Summary  Dizziness makes  you feel unsteady or light-headed. You may feel like you are about to pass out (faint).  Drink enough fluid to keep your pee (urine) clear or pale yellow. Do not drink alcohol.  Avoid making quick movements if you feel dizzy.  Watch your dizziness for any changes. This information is not intended to replace advice given to you by your health care provider. Make sure you discuss any questions you have with your health care provider. Document Released: 01/15/2011 Document Revised: 02/13/2016 Document Reviewed: 02/13/2016 Elsevier Interactive Patient Education  2019 [employer].

## 2018-04-18 NOTE — Telephone Encounter (Signed)
Requested medication (s) are due for refill today:  yes  Requested medication (s) are on the active medication list:  yes  Future visit scheduled:  yes  Last Refill: 02/18/17; #30; RF x 1  Requested Prescriptions  Pending Prescriptions Disp Refills   meclizine (ANTIVERT) 25 MG tablet 30 tablet 1     Not Delegated - Gastroenterology: Antiemetics Failed - 04/18/2018  9:58 AM      Failed - This refill cannot be delegated      Failed - Valid encounter within last 6 months    Recent Outpatient Visits          6 months ago Diabetes mellitus type 2 with neurological manifestations ([place])   [place], Evie Lacks, MD   10 months ago Need for Tdap vaccination   [place] Plotnikov, Evie Lacks, MD   1 year ago Type 2 diabetes mellitus with hyperglycemia, without long-term current use of insulin ([place])   [place] Plotnikov, Evie Lacks, MD   1 year ago [place], Delphia Grates, NP   1 year ago Type 2 diabetes mellitus with hyperglycemia, without long-term current use of insulin ([place])   [facility] -Georges Mouse, MD      Future Appointments            In 3 days Plotnikov, Evie Lacks, MD [place], [facility]

## 2018-04-19 ENCOUNTER — Encounter: Payer: Self-pay | Admitting: Nurse Practitioner

## 2018-04-19 ENCOUNTER — Other Ambulatory Visit: Payer: Self-pay | Admitting: *Deleted

## 2018-04-19 ENCOUNTER — Telehealth: Payer: Self-pay | Admitting: *Deleted

## 2018-04-19 DIAGNOSIS — R42 Dizziness and giddiness: Secondary | ICD-10-CM

## 2018-04-19 MED ORDER — MECLIZINE HCL 25 MG PO TABS: ORAL_TABLET | ORAL | 0 refills | Status: DC

## 2018-04-19 NOTE — Telephone Encounter (Signed)
Copied from [place] [id number]. Topic: Quick Communication - Lab Results (Clinic Use ONLY) >> Apr 19, 2018 10:00 AM Lennox Solders wrote: Pt is calling and would like blood work results

## 2018-04-21 ENCOUNTER — Ambulatory Visit (INDEPENDENT_AMBULATORY_CARE_PROVIDER_SITE_OTHER): Payer: Medicare Other | Admitting: Internal Medicine

## 2018-04-21 ENCOUNTER — Encounter: Payer: Self-pay | Admitting: Internal Medicine

## 2018-04-21 ENCOUNTER — Other Ambulatory Visit: Payer: Self-pay

## 2018-04-21 VITALS — BP 146/66 | HR 81 | Temp 98.2°F | Ht 72.0 in | Wt 256.0 lb

## 2018-04-21 DIAGNOSIS — M2042 Other hammer toe(s) (acquired), left foot: Secondary | ICD-10-CM | POA: Diagnosis not present

## 2018-04-21 DIAGNOSIS — E1140 Type 2 diabetes mellitus with diabetic neuropathy, unspecified: Secondary | ICD-10-CM | POA: Diagnosis not present

## 2018-04-21 DIAGNOSIS — Z951 Presence of aortocoronary bypass graft: Secondary | ICD-10-CM

## 2018-04-21 DIAGNOSIS — M2041 Other hammer toe(s) (acquired), right foot: Secondary | ICD-10-CM | POA: Diagnosis not present

## 2018-04-21 DIAGNOSIS — I10 Essential (primary) hypertension: Secondary | ICD-10-CM | POA: Diagnosis not present

## 2018-04-21 DIAGNOSIS — R42 Dizziness and giddiness: Secondary | ICD-10-CM | POA: Diagnosis not present

## 2018-04-21 DIAGNOSIS — E0842 Diabetes mellitus due to underlying condition with diabetic polyneuropathy: Secondary | ICD-10-CM | POA: Diagnosis not present

## 2018-04-21 DIAGNOSIS — M79675 Pain in left toe(s): Secondary | ICD-10-CM | POA: Diagnosis not present

## 2018-04-21 DIAGNOSIS — E1149 Type 2 diabetes mellitus with other diabetic neurological complication: Secondary | ICD-10-CM

## 2018-04-21 DIAGNOSIS — M79674 Pain in right toe(s): Secondary | ICD-10-CM | POA: Diagnosis not present

## 2018-04-21 NOTE — Patient Instructions (Signed)
No driving if dizzy 

## 2018-04-21 NOTE — Assessment & Plan Note (Signed)
CABG x 3 by Dr [name]- LIMA and "two vein grafts" Lipitor and ASA; Coreg, Losartan

## 2018-04-21 NOTE — Assessment & Plan Note (Signed)
Losartan, Coreg

## 2018-04-21 NOTE — Assessment & Plan Note (Signed)
On Gabapentin at ?dose

## 2018-04-21 NOTE — Assessment & Plan Note (Addendum)
F/u w/dr Chalmers Cater Cont meds Labs Improve compliance

## 2018-04-21 NOTE — Progress Notes (Signed)
Subjective:  Patient ID: Clinton Kirby, male    DOB: 1945/07/25  Age: 73 y.o. MRN: [id number]  CC: No chief complaint on file.   HPI Clinton Kirby presents for vertigo - better, dyslipidemia, DM f/u  CBGs <200 per pt C/o constipation - stool q 2 weeks  Outpatient Medications Prior to Visit  Medication Sig Dispense Refill  . aspirin 81 MG tablet Take 81 mg by mouth daily.    [name] atorvastatin (LIPITOR) 40 MG tablet Take 1 tablet (40 mg total) by mouth daily. 90 tablet 3  . b complex vitamins tablet Take 1 tablet by mouth daily. 100 tablet 3  . Blood Glucose Monitoring Suppl (ONETOUCH VERIO IQ SYSTEM) w/Device KIT USE TO CHECK BLOOD SUGAR  DAILY. DX E11.9 1 kit 0  . carvedilol (COREG) 25 MG tablet Take 1 tablet (25 mg total) by mouth 2 (two) times daily. 180 tablet 3  . Cholecalciferol (VITAMIN D3) 2000 units capsule Take 1 capsule (2,000 Units total) by mouth daily. 100 capsule 3  . clotrimazole-betamethasone (LOTRISONE) cream Apply topically 2 (two) times daily. 45 g 1  . gabapentin (NEURONTIN) 300 MG capsule Take 1 capsule (300 mg total) by mouth at bedtime. 90 capsule 3  . glipiZIDE (GLUCOTROL XL) 2.5 MG 24 hr tablet Take 1 tablet (2.5 mg total) by mouth daily with breakfast. 90 tablet 3  . glucose blood (ONETOUCH VERIO) test strip 1 each by Other route daily. Use to check blood sugars twice a day 300 each 1  . losartan (COZAAR) 50 MG tablet Take 1 tablet (50 mg total) by mouth daily. 90 tablet 3  . meclizine (ANTIVERT) 25 MG tablet TAKE 1 TABLET BY MOUTH THREE TIMES DAILY AS NEEDED FOR  DIZZINESS 20 tablet 0  . metFORMIN (GLUCOPHAGE) 1000 MG tablet Take 1 tablet (1,000 mg total) by mouth 2 (two) times daily with a meal. 180 tablet 3  . ONETOUCH DELICA LANCETS FINE MISC Use to help check blood sugars twice a day. Dx E11.9 300 each 1  . tamsulosin (FLOMAX) 0.4 MG CAPS capsule Take 1 capsule (0.4 mg total) by mouth daily after supper. 90 capsule 3   Facility-Administered  Medications Prior to Visit  Medication Dose Route Frequency Provider Last Rate Last Dose  . aspirin chewable tablet 81 mg  81 mg Oral Once Martinique, Peter M, MD        ROS: Review of Systems  Constitutional: Positive for fatigue and unexpected weight change. Negative for appetite change.  HENT: Negative for congestion, nosebleeds, sneezing, sore throat and trouble swallowing.   Eyes: Negative for itching and visual disturbance.  Respiratory: Negative for cough.   Cardiovascular: Negative for chest pain, palpitations and leg swelling.  Gastrointestinal: Positive for constipation. Negative for abdominal distention, blood in stool, diarrhea and nausea.  Genitourinary: Negative for frequency and hematuria.  Musculoskeletal: Positive for arthralgias. Negative for back pain, gait problem, joint swelling and neck pain.  Skin: Negative for rash.  Neurological: Negative for dizziness, tremors, speech difficulty and weakness.  Psychiatric/Behavioral: Negative for agitation, dysphoric mood, sleep disturbance and suicidal ideas. The patient is not nervous/anxious.     Objective:  BP (!) 146/66 (BP Location: Left Arm, Patient Position: Sitting, Cuff Size: Large)   Pulse 81   Temp 98.2 F (36.8 C) (Oral)   Ht 6' (1.829 m)   Wt 256 lb (116.1 kg)   SpO2 96%   BMI 34.72 kg/m   BP Readings from Last 3 Encounters:  04/21/18 [name])  146/66  04/18/18 (!) 170/90  10/21/17 130/62    Wt Readings from Last 3 Encounters:  04/21/18 256 lb (116.1 kg)  04/18/18 253 lb (114.8 kg)  10/21/17 257 lb 6.4 oz (116.8 kg)    Physical Exam Constitutional:      General: He is not in acute distress.    Appearance: He is well-developed.     Comments: NAD  Eyes:     Conjunctiva/sclera: Conjunctivae normal.     Pupils: Pupils are equal, round, and reactive to light.  Neck:     Musculoskeletal: Normal range of motion.     Thyroid: No thyromegaly.     Vascular: No JVD.  Cardiovascular:     Rate and Rhythm:  Normal rate and regular rhythm.     Heart sounds: Normal heart sounds. No murmur. No friction rub. No gallop.   Pulmonary:     Effort: Pulmonary effort is normal. No respiratory distress.     Breath sounds: Normal breath sounds. No wheezing or rales.  Chest:     Chest wall: No tenderness.  Abdominal:     General: Bowel sounds are normal. There is no distension.     Palpations: Abdomen is soft. There is no mass.     Tenderness: There is no abdominal tenderness. There is no guarding or rebound.  Musculoskeletal: Normal range of motion.        General: No tenderness.  Lymphadenopathy:     Cervical: No cervical adenopathy.  Skin:    General: Skin is warm and dry.     Findings: No rash.  Neurological:     Mental Status: He is alert and oriented to person, place, and time.     Cranial Nerves: No cranial nerve deficit.     Motor: No abnormal muscle tone.     Coordination: Coordination normal.     Gait: Gait normal.     Deep Tendon Reflexes: Reflexes are normal and symmetric.  Psychiatric:        Behavior: Behavior normal.        Thought Content: Thought content normal.        Judgment: Judgment normal.   obese Dizzy w/position change  Lab Results  Component Value Date   WBC 6.6 04/18/2018   HGB 13.9 04/18/2018   HCT 41.2 04/18/2018   PLT 252.0 04/18/2018   GLUCOSE 291 (H) 04/18/2018   CHOL 175 04/02/2017   TRIG 210.0 (H) 04/02/2017   HDL 45.40 04/02/2017   LDLDIRECT 106.0 05/14/2017   LDLCALC 49 01/31/2016   ALT 20 04/18/2018   AST 16 04/18/2018   NA 137 04/18/2018   K 4.1 04/18/2018   CL 103 04/18/2018   CREATININE 1.03 04/18/2018   BUN 17 04/18/2018   CO2 26 04/18/2018   TSH 1.91 04/18/2018   PSA 41.85 (H) 06/25/2015   HGBA1C 7.8 04/05/2017   MICROALBUR 6.3 (H) 05/14/2017    No results found.  Assessment & Plan:   There are no diagnoses linked to this encounter.   No orders of the defined types were placed in this encounter.    Follow-up: No follow-ups  on file.  Walker Kehr, MD

## 2018-04-21 NOTE — Assessment & Plan Note (Addendum)
Better Declined Zofran No driving if dizzy!

## 2018-05-24 DIAGNOSIS — E113513 Type 2 diabetes mellitus with proliferative diabetic retinopathy with macular edema, bilateral: Secondary | ICD-10-CM | POA: Diagnosis not present

## 2018-05-31 DIAGNOSIS — E1140 Type 2 diabetes mellitus with diabetic neuropathy, unspecified: Secondary | ICD-10-CM | POA: Diagnosis not present

## 2018-05-31 DIAGNOSIS — I10 Essential (primary) hypertension: Secondary | ICD-10-CM | POA: Diagnosis not present

## 2018-05-31 DIAGNOSIS — E7800 Pure hypercholesterolemia, unspecified: Secondary | ICD-10-CM | POA: Diagnosis not present

## 2018-05-31 DIAGNOSIS — E1165 Type 2 diabetes mellitus with hyperglycemia: Secondary | ICD-10-CM | POA: Diagnosis not present

## 2018-06-28 DIAGNOSIS — M2041 Other hammer toe(s) (acquired), right foot: Secondary | ICD-10-CM | POA: Diagnosis not present

## 2018-06-28 DIAGNOSIS — L600 Ingrowing nail: Secondary | ICD-10-CM | POA: Diagnosis not present

## 2018-06-28 DIAGNOSIS — M2042 Other hammer toe(s) (acquired), left foot: Secondary | ICD-10-CM | POA: Diagnosis not present

## 2018-06-28 DIAGNOSIS — B351 Tinea unguium: Secondary | ICD-10-CM | POA: Diagnosis not present

## 2018-06-28 DIAGNOSIS — E1140 Type 2 diabetes mellitus with diabetic neuropathy, unspecified: Secondary | ICD-10-CM | POA: Diagnosis not present

## 2018-07-04 ENCOUNTER — Other Ambulatory Visit: Payer: Self-pay | Admitting: Cardiology

## 2018-07-18 DIAGNOSIS — E1142 Type 2 diabetes mellitus with diabetic polyneuropathy: Secondary | ICD-10-CM | POA: Diagnosis not present

## 2018-07-18 DIAGNOSIS — E1140 Type 2 diabetes mellitus with diabetic neuropathy, unspecified: Secondary | ICD-10-CM | POA: Diagnosis not present

## 2018-07-18 DIAGNOSIS — R200 Anesthesia of skin: Secondary | ICD-10-CM | POA: Diagnosis not present

## 2018-07-18 DIAGNOSIS — G63 Polyneuropathy in diseases classified elsewhere: Secondary | ICD-10-CM | POA: Diagnosis not present

## 2018-07-22 ENCOUNTER — Ambulatory Visit: Payer: Medicare Other | Admitting: Internal Medicine

## 2018-07-25 ENCOUNTER — Encounter: Payer: Self-pay | Admitting: Internal Medicine

## 2018-07-25 ENCOUNTER — Ambulatory Visit (INDEPENDENT_AMBULATORY_CARE_PROVIDER_SITE_OTHER): Payer: Medicare Other | Admitting: Internal Medicine

## 2018-07-25 ENCOUNTER — Other Ambulatory Visit: Payer: Self-pay

## 2018-07-25 DIAGNOSIS — C61 Malignant neoplasm of prostate: Secondary | ICD-10-CM | POA: Diagnosis not present

## 2018-07-25 MED ORDER — CARVEDILOL 25 MG PO TABS: 25.0000 mg | ORAL_TABLET | Freq: Two times a day (BID) | ORAL | 3 refills | Status: DC

## 2018-07-25 MED ORDER — LOSARTAN POTASSIUM 50 MG PO TABS: 50.0000 mg | ORAL_TABLET | Freq: Every day | ORAL | 3 refills | Status: DC

## 2018-07-25 MED ORDER — METFORMIN HCL 1000 MG PO TABS: 1000.0000 mg | ORAL_TABLET | Freq: Two times a day (BID) | ORAL | 3 refills | Status: DC

## 2018-07-25 MED ORDER — ATORVASTATIN CALCIUM 40 MG PO TABS: 40.0000 mg | ORAL_TABLET | Freq: Every day | ORAL | 3 refills | Status: DC

## 2018-07-25 MED ORDER — TAMSULOSIN HCL 0.4 MG PO CAPS: 0.4000 mg | ORAL_CAPSULE | Freq: Every day | ORAL | 3 refills | Status: DC

## 2018-07-25 MED ORDER — GABAPENTIN 300 MG PO CAPS: 300.0000 mg | ORAL_CAPSULE | Freq: Every day | ORAL | 3 refills | Status: DC

## 2018-07-25 MED ORDER — GLIPIZIDE ER 2.5 MG PO TB24: 2.5000 mg | ORAL_TABLET | Freq: Every day | ORAL | 3 refills | Status: DC

## 2018-07-25 NOTE — Progress Notes (Signed)
Subjective:  Patient ID: Clinton Kirby, male    DOB: November 18, 1945  Age: 73 y.o. MRN: [id number]  CC: No chief complaint on file.   HPI Clinton Kirby presents for  DM, HTN, BPH f/u. He was out of Coreg x 2 weeks. He had labs w/Dr Chalmers Cater 1 mo ago. Due to see his eye doctor   Outpatient Medications Prior to Visit  Medication Sig Dispense Refill  . aspirin 81 MG tablet Take 81 mg by mouth daily.    [name] atorvastatin (LIPITOR) 40 MG tablet Take 1 tablet (40 mg total) by mouth daily. 90 tablet 3  . b complex vitamins tablet Take 1 tablet by mouth daily. 100 tablet 3  . Blood Glucose Monitoring Suppl (ONETOUCH VERIO IQ SYSTEM) w/Device KIT USE TO CHECK BLOOD SUGAR  DAILY. DX E11.9 1 kit 0  . carvedilol (COREG) 25 MG tablet Take 1 tablet (25 mg total) by mouth 2 (two) times daily. 180 tablet 3  . Cholecalciferol (VITAMIN D3) 2000 units capsule Take 1 capsule (2,000 Units total) by mouth daily. 100 capsule 3  . clotrimazole-betamethasone (LOTRISONE) cream Apply topically 2 (two) times daily. 45 g 1  . gabapentin (NEURONTIN) 300 MG capsule Take 1 capsule (300 mg total) by mouth at bedtime. 90 capsule 3  . glipiZIDE (GLUCOTROL XL) 2.5 MG 24 hr tablet Take 1 tablet (2.5 mg total) by mouth daily with breakfast. 90 tablet 3  . glucose blood (ONETOUCH VERIO) test strip 1 each by Other route daily. Use to check blood sugars twice a day 300 each 1  . losartan (COZAAR) 50 MG tablet TAKE 1 TABLET BY MOUTH  DAILY 90 tablet 1  . meclizine (ANTIVERT) 25 MG tablet TAKE 1 TABLET BY MOUTH THREE TIMES DAILY AS NEEDED FOR  DIZZINESS 20 tablet 0  . metFORMIN (GLUCOPHAGE) 1000 MG tablet Take 1 tablet (1,000 mg total) by mouth 2 (two) times daily with a meal. 180 tablet 3  . ONETOUCH DELICA LANCETS FINE MISC Use to help check blood sugars twice a day. Dx E11.9 300 each 1  . tamsulosin (FLOMAX) 0.4 MG CAPS capsule Take 1 capsule (0.4 mg total) by mouth daily after supper. 90 capsule 3   Facility-Administered  Medications Prior to Visit  Medication Dose Route Frequency Provider Last Rate Last Dose  . aspirin chewable tablet 81 mg  81 mg Oral Once [place], Peter M, MD        ROS: Review of Systems  Constitutional: Positive for unexpected weight change. Negative for appetite change and fatigue.  HENT: Negative for congestion, nosebleeds, sneezing, sore throat and trouble swallowing.   Eyes: Positive for visual disturbance. Negative for itching.  Respiratory: Negative for cough.   Cardiovascular: Negative for chest pain, palpitations and leg swelling.  Gastrointestinal: Negative for abdominal distention, blood in stool, diarrhea and nausea.  Genitourinary: Negative for frequency and hematuria.  Musculoskeletal: Negative for back pain, gait problem, joint swelling and neck pain.  Skin: Negative for rash.  Neurological: Positive for numbness. Negative for dizziness, tremors, speech difficulty and weakness.  Psychiatric/Behavioral: Negative for agitation, dysphoric mood, sleep disturbance and suicidal ideas. The patient is not nervous/anxious.     Objective:  BP (!) 150/76 (BP Location: Left Arm, Patient Position: Sitting, Cuff Size: Large)   Pulse 80   Temp 98.6 F (37 C) (Oral)   Ht 6' (1.829 m)   Wt 248 lb (112.5 kg)   SpO2 98%   BMI 33.63 kg/m   BP Readings  from Last 3 Encounters:  07/25/18 (!) 150/76  04/21/18 (!) 146/66  04/18/18 (!) 170/90    Wt Readings from Last 3 Encounters:  07/25/18 248 lb (112.5 kg)  04/21/18 256 lb (116.1 kg)  04/18/18 253 lb (114.8 kg)    Physical Exam Constitutional:      General: He is not in acute distress.    Appearance: He is well-developed.     Comments: NAD  Eyes:     Conjunctiva/sclera: Conjunctivae normal.     Pupils: Pupils are equal, round, and reactive to light.  Neck:     Musculoskeletal: Normal range of motion.     Thyroid: No thyromegaly.     Vascular: No JVD.  Cardiovascular:     Rate and Rhythm: Normal rate and regular  rhythm.     Heart sounds: Normal heart sounds. No murmur. No friction rub. No gallop.   Pulmonary:     Effort: Pulmonary effort is normal. No respiratory distress.     Breath sounds: Normal breath sounds. No wheezing or rales.  Chest:     Chest wall: No tenderness.  Abdominal:     General: Bowel sounds are normal. There is no distension.     Palpations: Abdomen is soft. There is no mass.     Tenderness: There is no abdominal tenderness. There is no guarding or rebound.  Musculoskeletal: Normal range of motion.        General: Tenderness present.  Lymphadenopathy:     Cervical: No cervical adenopathy.  Skin:    General: Skin is warm and dry.     Findings: No rash.  Neurological:     Mental Status: He is alert and oriented to person, place, and time.     Cranial Nerves: No cranial nerve deficit.     Motor: No abnormal muscle tone.     Coordination: Coordination normal.     Gait: Gait normal.     Deep Tendon Reflexes: Reflexes are normal and symmetric.  Psychiatric:        Behavior: Behavior normal.        Thought Content: Thought content normal.        Judgment: Judgment normal.   obese  Lab Results  Component Value Date   WBC 6.6 04/18/2018   HGB 13.9 04/18/2018   HCT 41.2 04/18/2018   PLT 252.0 04/18/2018   GLUCOSE 291 (H) 04/18/2018   CHOL 175 04/02/2017   TRIG 210.0 (H) 04/02/2017   HDL 45.40 04/02/2017   LDLDIRECT 106.0 05/14/2017   LDLCALC 49 01/31/2016   ALT 20 04/18/2018   AST 16 04/18/2018   NA 137 04/18/2018   K 4.1 04/18/2018   CL 103 04/18/2018   CREATININE 1.03 04/18/2018   BUN 17 04/18/2018   CO2 26 04/18/2018   TSH 1.91 04/18/2018   PSA 41.85 (H) 06/25/2015   HGBA1C 7.8 04/05/2017   MICROALBUR 6.3 (H) 05/14/2017    No results found.  Assessment & Plan:    Walker Kehr, MD

## 2018-07-26 ENCOUNTER — Other Ambulatory Visit: Payer: Self-pay

## 2018-07-26 NOTE — Telephone Encounter (Signed)
Refill

## 2018-08-02 DIAGNOSIS — E113513 Type 2 diabetes mellitus with proliferative diabetic retinopathy with macular edema, bilateral: Secondary | ICD-10-CM | POA: Diagnosis not present

## 2018-08-07 NOTE — Progress Notes (Signed)
Cardiology Office Note    Date:  08/09/2018   ID:  Clinton Kirby, DOB 20-Oct-1945, MRN [id number]  PCP:  [name], MD  Cardiologist:  [name], MD    History of Present Illness:  Clinton Kirby is a 73 y.o. male with known history of coronary disease and  poorly controlled diabetes mellitus type 2 as well as hyperlipidemia, hypertension and peripheral neuropathy.  He is status post inferior myocardial infarction in February of 2004 while in [place]. This was treated with a drug-eluting stent to the right coronary. In 2005 he had some abnormality on a stress test and underwent repeat cardiac catheterization. This showed the stent to be widely patent and he had no other significant disease. Stress test here was in July 2014 showed a fixed inferior wall scar and mild LV dysfunction.   He is  s/p CABG in [facility] on 11/11/15. H was admitted  with chest pain. He ruled out for MI.  He  had an abnormal Myoview showing inferior and apical scar with ischemia in the anterior wall and apex. EF 28%.  He underwent cardiac cath 11/06/15. His cath showed a patent RCA stent but an occluded LAD and 70% stenosis in the OM1 and OM2.  He underwent CABG x 1 with LIMA-LAD 11/11/15 by Dr [name]. The OMs were felt to be too small for bypass. Post OP echo 11/13/15 showed his EF to be 40%. Carotid dopplers showed no obstructive disease. It was noted on cardiac cath that his right innominate artery was very tortuous.   Cath films reviewed personally by me:  11/07/15. This showed occlusion of the proximal LAD with some collateral. There was a large ramus vessel that bifurcated in the mid vessel. There was 40-50% disease at the bifurcation. The first OM was a moderate sized vessel with 80-90% stenosis. The second OM was smaller with a 70% stenosis. The RCA was patent in the mid vessel at site of prior stent. There was diffuse 40% disease in the proximal vessel. It is surprising that the OMs  were too small for bypass. If he has refractory angina could be considered for PCI of OM1. On a prior  visit losartan was added for LV dysfunction.  On follow up today he is doing well from a cardiac standpoint. Denies dyspnea or chest pain. He continues to have leg weakness. Admits to eating more pork sausage. Reports last A1c down to 6.5%.   Past Medical History:  Diagnosis Date  . Balanitis   . CAD (coronary artery disease)   . Depression   . Diabetes mellitus type II   . ED (erectile dysfunction)   . HTN (hypertension)   . Hyperlipidemia   . Old MI (myocardial infarction) 12/07/2013  . Prostate cancer [facility])     Past Surgical History:  Procedure Laterality Date  . CORONARY ARTERY BYPASS GRAFT  10/2015  . PROSTATE BIOPSY    . UMBILICAL HERNIA REPAIR      Current Medications: Outpatient Medications Prior to Visit  Medication Sig Dispense Refill  . aspirin 81 MG tablet Take 81 mg by mouth daily.    [name] atorvastatin (LIPITOR) 40 MG tablet Take 1 tablet (40 mg total) by mouth daily. 90 tablet 3  . b complex vitamins tablet Take 1 tablet by mouth daily. 100 tablet 3  . Blood Glucose Monitoring Suppl (ONETOUCH VERIO IQ SYSTEM) w/Device KIT USE TO CHECK BLOOD SUGAR  DAILY. DX E11.9 1 kit 0  .  carvedilol (COREG) 25 MG tablet Take 1 tablet (25 mg total) by mouth 2 (two) times daily. 180 tablet 3  . Cholecalciferol (VITAMIN D3) 2000 units capsule Take 1 capsule (2,000 Units total) by mouth daily. 100 capsule 3  . clotrimazole-betamethasone (LOTRISONE) cream Apply topically 2 (two) times daily. 45 g 1  . DULoxetine (CYMBALTA) 30 MG capsule Take 30 mg by mouth daily.    [name] gabapentin (NEURONTIN) 300 MG capsule Take 1 capsule (300 mg total) by mouth at bedtime. 90 capsule 3  . glipiZIDE (GLUCOTROL XL) 2.5 MG 24 hr tablet Take 1 tablet (2.5 mg total) by mouth daily with breakfast. 90 tablet 3  . glucose blood (ONETOUCH VERIO) test strip 1 each by Other route daily. Use to check blood sugars  twice a day 300 each 1  . meclizine (ANTIVERT) 25 MG tablet TAKE 1 TABLET BY MOUTH THREE TIMES DAILY AS NEEDED FOR  DIZZINESS 20 tablet 0  . metFORMIN (GLUCOPHAGE) 1000 MG tablet Take 1 tablet (1,000 mg total) by mouth 2 (two) times daily with a meal. 180 tablet 3  . ONETOUCH DELICA LANCETS FINE MISC Use to help check blood sugars twice a day. Dx E11.9 300 each 1  . tamsulosin (FLOMAX) 0.4 MG CAPS capsule Take 1 capsule (0.4 mg total) by mouth daily after supper. 90 capsule 3  . losartan (COZAAR) 50 MG tablet Take 1 tablet (50 mg total) by mouth daily. 90 tablet 3   Facility-Administered Medications Prior to Visit  Medication Dose Route Frequency Provider Last Rate Last Dose  . aspirin chewable tablet 81 mg  81 mg Oral Once Martinique, Peter M, MD         Allergies:   Vania Rea [empagliflozin]   Social History   Socioeconomic History  . Marital status: Married    Spouse name: Not on file  . Number of children: 1  . Years of education: Not on file  . Highest education level: Not on file  Occupational History  . Occupation: truck driver-self employed  Social Needs  . Financial resource strain: Not on file  . Food insecurity    Worry: Not on file    Inability: Not on file  . Transportation needs    Medical: Not on file    Non-medical: Not on file  Tobacco Use  . Smoking status: Never Smoker  . Smokeless tobacco: Never Used  Substance and Sexual Activity  . Alcohol use: No    Alcohol/week: 0.0 standard drinks  . Drug use: No  . Sexual activity: Yes  Lifestyle  . Physical activity    Days per week: Not on file    Minutes per session: Not on file  . Stress: Not on file  Relationships  . Social Herbalist on phone: Not on file    Gets together: Not on file    Attends religious service: Not on file    Active member of club or organization: Not on file    Attends meetings of clubs or organizations: Not on file    Relationship status: Not on file  Other Topics Concern   . Not on file  Social History Narrative  . Not on file     Family History:  The patient's family history includes Cancer in his father; Diabetes in his mother and another family member.   ROS:   Please see the history of present illness.    ROS All other systems reviewed and are negative.   PHYSICAL EXAM:  VS:  BP (!) 156/86   Pulse 87   Temp (!) 97.3 F (36.3 C)   Ht 6' (1.829 m)   Wt 243 lb (110.2 kg)   SpO2 95%   BMI 32.96 kg/m    GENERAL:  Well appearing overweight BM in NAD HEENT:  PERRL, EOMI, sclera are clear. Oropharynx is clear. NECK:  No jugular venous distention, carotid upstroke brisk and symmetric, no bruits, no thyromegaly or adenopathy LUNGS:  Clear to auscultation bilaterally CHEST:  Unremarkable HEART:  RRR,  PMI not displaced or sustained,S1 and S2 within normal limits, no S3, no S4: no clicks, no rubs, no murmurs ABD:  Soft, nontender. BS +, no masses or bruits. No hepatomegaly, no splenomegaly EXT:  2 + pulses throughout, no edema, no cyanosis no clubbing SKIN:  Warm and dry.  No rashes NEURO:  Alert and oriented x 3. Cranial nerves II through XII intact. PSYCH:  Cognitively intact      Wt Readings from Last 3 Encounters:  08/09/18 243 lb (110.2 kg)  07/25/18 248 lb (112.5 kg)  04/21/18 256 lb (116.1 kg)      Studies/Labs Reviewed:   EKG:  EKG is ordered today. NSR with old inferior and anterior infarcts. No change. I have personally reviewed and interpreted this study.    Recent Labs: 04/18/2018: ALT 20; BUN 17; Creatinine, Ser 1.03; Hemoglobin 13.9; Platelets 252.0; Potassium 4.1; Sodium 137; TSH 1.91  Dated 07/19/17: A1c 8.1%.  Dated 08/05/17 A1c 9.1%  Lipid Panel    Component Value Date/Time   CHOL 175 04/02/2017 0955   TRIG 210.0 (H) 04/02/2017 0955   HDL 45.40 04/02/2017 0955   CHOLHDL 4 04/02/2017 0955   VLDL 42.0 (H) 04/02/2017 0955   LDLCALC 49 01/31/2016 1411   LDLDIRECT 106.0 05/14/2017 1025    Additional studies/  records that were reviewed today include:  Echo 05/07/16: Study Conclusions  - Left ventricle: Septal apical and inferobasal hypokinesis The   cavity size was normal. Wall thickness was increased in a pattern   of mild LVH. Systolic function was mildly to moderately reduced.   The estimated ejection fraction was in the range of 40% to 45%.   Wall motion was normal; there were no regional wall motion   abnormalities. Left ventricular diastolic function parameters   were normal. - Atrial septum: No defect or patent foramen ovale was identified.   ASSESSMENT:    1. Coronary artery disease of native artery of native heart with stable angina pectoris ([place])   2. CAD S/P percutaneous coronary angioplasty   3. CABG x 3 11/11/15 in [facility]   4. Chronic systolic heart failure ([place])   5. Essential hypertension   6. Hypercholesterolemia      PLAN:  In order of problems listed above:  1. CAD: Patient is s/p CABG with LIMA to the LAD. Old stent in RCA. Residual disease in the OMs and Ramus. He is asymptomatic. Continue current therapy.  Continue ASA, beta blocker.  2. Chronic systolic CHF. Last EF was  40%. He is asymptomatic. Will continue Coreg  25 mg bid. increase losartan to 100  mg daily since BP high. 3. Dyslipidemia  Goal LDL < 70. On atorvastatin. Reports recent blood work but I cannot access results.  4. Prostate CA-s/p RT 5.   DM type 2 with neuropathy. Per endocrinology.  Medication Adjustments/Labs and Tests Ordered: Current medicines are reviewed at length with the patient today.  Concerns regarding medicines are outlined above.  Medication  changes, Labs and Tests ordered today are listed in the Patient Instructions below. Patient Instructions  Increase losartan to 100 mg daily  Continue your other therapy  Follow up in 6 months     Signed, [name], MD  08/09/2018 10:25 AM    [place] [address] [place], [place], [address]  [phone]

## 2018-08-08 ENCOUNTER — Telehealth: Payer: Self-pay | Admitting: Cardiology

## 2018-08-08 NOTE — Telephone Encounter (Signed)
° ° °  COVID-19 Pre-Screening Questions:   In the past 7 to 10 days have you had a cough,  shortness of breath, headache, congestion, fever (100 or greater) body aches, chills, sore throat, or sudden loss of taste or sense of smell? no  Have you been around anyone with known Covid 19.no  Have you been around anyone who is awaiting Covid 19 test results in the past 7 to 10 days? no  Have you been around anyone who has been exposed to Covid 19, or has mentioned symptoms of Covid 19 within the past 7 to 10 days?no  If you have any concerns/questions about symptoms patients report during screening (either on the phone or at threshold). Contact the provider seeing the patient or DOD for further guidance.  If neither are available contact a member of the leadership team.  I called pt to confirm his 08-09-18 appt with Dr Martinique.             I

## 2018-08-09 ENCOUNTER — Encounter: Payer: Self-pay | Admitting: Cardiology

## 2018-08-09 ENCOUNTER — Other Ambulatory Visit: Payer: Self-pay

## 2018-08-09 ENCOUNTER — Ambulatory Visit (INDEPENDENT_AMBULATORY_CARE_PROVIDER_SITE_OTHER): Payer: Medicare Other | Admitting: Cardiology

## 2018-08-09 VITALS — BP 156/86 | HR 87 | Temp 97.3°F | Ht 72.0 in | Wt 243.0 lb

## 2018-08-09 DIAGNOSIS — I25118 Atherosclerotic heart disease of native coronary artery with other forms of angina pectoris: Secondary | ICD-10-CM

## 2018-08-09 DIAGNOSIS — I5022 Chronic systolic (congestive) heart failure: Secondary | ICD-10-CM

## 2018-08-09 DIAGNOSIS — I10 Essential (primary) hypertension: Secondary | ICD-10-CM

## 2018-08-09 DIAGNOSIS — Z9861 Coronary angioplasty status: Secondary | ICD-10-CM

## 2018-08-09 DIAGNOSIS — E7800 Pure hypercholesterolemia, unspecified: Secondary | ICD-10-CM

## 2018-08-09 DIAGNOSIS — Z951 Presence of aortocoronary bypass graft: Secondary | ICD-10-CM

## 2018-08-09 DIAGNOSIS — I2510 Atherosclerotic heart disease of native coronary artery without angina pectoris: Secondary | ICD-10-CM

## 2018-08-09 MED ORDER — LOSARTAN POTASSIUM 100 MG PO TABS: 100.0000 mg | ORAL_TABLET | Freq: Every day | ORAL | 3 refills | Status: DC

## 2018-08-09 NOTE — Patient Instructions (Addendum)
Increase losartan to 100 mg daily  Continue your other therapy  Follow up in 6 months

## 2018-08-30 DIAGNOSIS — E1165 Type 2 diabetes mellitus with hyperglycemia: Secondary | ICD-10-CM | POA: Diagnosis not present

## 2018-08-30 DIAGNOSIS — E7800 Pure hypercholesterolemia, unspecified: Secondary | ICD-10-CM | POA: Diagnosis not present

## 2018-08-30 DIAGNOSIS — E1140 Type 2 diabetes mellitus with diabetic neuropathy, unspecified: Secondary | ICD-10-CM | POA: Diagnosis not present

## 2018-08-30 DIAGNOSIS — I10 Essential (primary) hypertension: Secondary | ICD-10-CM | POA: Diagnosis not present

## 2018-09-29 ENCOUNTER — Other Ambulatory Visit: Payer: Self-pay

## 2018-09-29 ENCOUNTER — Encounter: Payer: Medicare Other | Attending: Endocrinology | Admitting: Registered"

## 2018-09-29 ENCOUNTER — Encounter: Payer: Self-pay | Admitting: Registered"

## 2018-09-29 DIAGNOSIS — E1165 Type 2 diabetes mellitus with hyperglycemia: Secondary | ICD-10-CM

## 2018-09-29 NOTE — Progress Notes (Signed)
Visit Type: First/Initial  Appt. Start Time: 1420 Appt. End Time: 3299  10/05/2018  [name], identified by name and date of birth, is a 73 y.o. male with a diagnosis of Diabetes: Type 2.   ASSESSMENT  Height 6' (1.829 m), weight 245 lb 12.8 oz (111.5 kg). Body mass index is 33.34 kg/m.   Pt present for appointment alone. Pt reports that he worries about COVID and also his health conditions. Pt reports he has seen Dietitian Antonieta Iba in the past. Pt initially thought he was seeing her again today which is why he reports he was running a little late.  Pt reports he skips a lot of meals and sometimes skips his medication if he does not eat and is supposed to taken with medication. Pt reports he has gotten tired of checking his blood sugar 3 times per day. Pt reports he has not checked his blood sugar this year d/t not liking to stick his finger. Reports he is willing to try sticking the side of his finger. Pt reports he really does not like needles and does not want to get on insulin. Reports that is why he came in today to find out how he can improve his blood sugar without insulin. Pt reports that his mother and grandmother both had to have amputations.   Pt reports that he retired a couple years ago and after that time he had heart surgery and CA which occurred in 2018. Reports he has not felt the same since having radiation tx in 2018. 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 Pt reports that he does not eat beef.  Diabetes Self-Management Education - 09/29/18 1440      Visit Information   Visit Type  First/Initial      Initial Visit   Diabetes Type  Type 2    Are you currently following a meal plan?  No    Are you taking your medications as prescribed?  --   Pt reports sometimes he does not take his medications because he skips meals.   Date Diagnosed  February 2004      Health Coping   How would you rate your overall health?  Good      Psychosocial  Assessment   Patient Belief/Attitude about Diabetes  Defeat/Burnout   Pt reports being tired of checking blood sugar.   Self-care barriers  Unable to determine    Self-management support  Family;Doctor's office    Other persons present  Patient    Patient Concerns  Nutrition/Meal planning;Glycemic Control   Pt unsure.   Special Needs  Unable to determine    Preferred Learning Style  No preference indicated    Learning Readiness  Contemplating    How often do you need to have someone help you when you read instructions, pamphlets, or other written materials from your doctor or pharmacy?  3 - Sometimes    What is the last grade level you completed in school?  12th grade.      Pre-Education Assessment   Patient understands the diabetes disease and treatment process.  Needs Instruction    Patient understands incorporating nutritional management into lifestyle.  Needs Instruction    Patient undertands incorporating physical activity into lifestyle.  Needs Instruction    Patient understands using medications safely.  Demonstrates understanding / competency    Patient understands monitoring blood glucose, interpreting and using results  Needs Instruction    Patient understands prevention, detection, and  treatment of acute complications.  Needs Instruction    Patient understands prevention, detection, and treatment of chronic complications.  Needs Instruction    Patient understands how to develop strategies to address psychosocial issues.  Needs Instruction    Patient understands how to develop strategies to promote health/change behavior.  Needs Instruction      Complications   Last HgB A1C per patient/outside source  10.9 %   08/30/18   How often do you check your blood sugar?  0 times/day (not testing)   Pt reports he has not checked his blood sugar at all this year.   Fasting Blood glucose range (mg/dL)  --   Pt has not been checking.   Postprandial Blood glucose range (mg/dL)  --   Pt is  not checking blood sugar.   Number of hypoglycemic episodes per month  0    Number of hyperglycemic episodes per week  --   Pt is not checking blood sugar.   Have you had a dilated eye exam in the past 12 months?  Yes    Have you had a dental exam in the past 12 months?  No    Are you checking your feet?  Yes    How many days per week are you checking your feet?  7      Dietary Intake   Breakfast  10-11 AM: bowl of grits, water    Lunch  12-1 PM: sandiwich-bologna, wheat bread, water    Dinner  5-6 PM: pork and beans, water    Beverage(s)  ~32 oz water      Exercise   Exercise Type  ADL's    How many days per week to you exercise?  0    How many minutes per day do you exercise?  0    Total minutes per week of exercise  0      Patient Education   Previous Diabetes Education  Yes (please comment)   Pt had recent increase in hgbA1c to 10.9   Disease state   Definition of diabetes, type 1 and 2, and the diagnosis of diabetes;Factors that contribute to the development of diabetes    Nutrition management   Carbohydrate counting;Role of diet in the treatment of diabetes and the relationship between the three main macronutrients and blood glucose level;Reviewed blood glucose goals for pre and post meals and how to evaluate the patients' food intake on their blood glucose level.    Physical activity and exercise   Role of exercise on diabetes management, blood pressure control and cardiac health.    Monitoring  Yearly dilated eye exam;Daily foot exams;Purpose and frequency of SMBG.;Taught/discussed recording of test results and interpretation of SMBG.;Interpreting lab values - A1C, lipid, urine microalbumina.    Acute complications  Taught treatment of hypoglycemia - the 15 rule.    Chronic complications  Dental care;Retinopathy and reason for yearly dilated eye exams;Lipid levels, blood glucose control and heart disease      Individualized Goals (developed by patient)   Nutrition  Follow meal  plan discussed    Medications  take my medication as prescribed    Monitoring   test my blood glucose as discussed    Reducing Risk  examine blood glucose patterns   Check blood sugar 2 times daily-fasting and 1 time postprandial     Post-Education Assessment   Patient understands the diabetes disease and treatment process.  Demonstrates understanding / competency    Patient understands incorporating nutritional management into  lifestyle.  Demonstrates understanding / competency    Patient undertands incorporating physical activity into lifestyle.  Demonstrates understanding / competency    Patient understands using medications safely.  Demonstrates understanding / competency    Patient understands monitoring blood glucose, interpreting and using results  Demonstrates understanding / competency    Patient understands prevention, detection, and treatment of acute complications.  Demonstrates understanding / competency    Patient understands prevention, detection, and treatment of chronic complications.  Demonstrates understanding / competency    Patient understands how to develop strategies to address psychosocial issues.  Needs Instruction    Patient understands how to develop strategies to promote health/change behavior.  Needs Instruction      Outcomes   Expected Outcomes  Demonstrated interest in learning. Expect positive outcomes    Future DMSE  4-6 wks    Program Status  Not Completed       Individualized Plan for Diabetes Self-Management Training:   Learning Objective:  Patient will have a greater understanding of diabetes self-management. Patient education plan is to attend individual and/or group sessions per assessed needs and concerns.   Plan: Dietitian provided DSME first half of booklet and nutrition education. Discussed importance of checking blood sugar to help with getting blood sugar numbers down and encouraged pt to check on side of finger instead of pad of finger as  side does not have as much feeling. Will go over rest of ADA booklet at next appointment in 1 month as well as assess progress with consistent meals and checking blood sugar.   Instructions/Goals:  Make sure to get in three meals per day. Try to have balanced meals like the plate example (see handout). Include lean proteins, vegetables, fruits, and whole grains at meals.   Eat 3 meals/eat every 4-5 hours while awake to help balance blood sugar. If unable to eat a whole meal include a balanced snack (see handout).   Include about the same amount of carbohydrates at each meal. Recommend starting with 2-3 carbohydrate servings (30-45 g) per meal. If this is not enough food can move to 3-4 carbohydrate servings/choices (45-60 g per meal)  Check blood sugar at least 2 times per day. Stick on side of finger to reduce pain of finger prick.  Fasting in the morning and 1 time 1-2 hours after a meal.   Diabetes Self-Management Education  Patient Instructions  Instructions/Goals:  Make sure to get in three meals per day. Try to have balanced meals like the plate example (see handout). Include lean proteins, vegetables, fruits, and whole grains at meals.  Eat 3 meals/eat every 4-5 hours while awake to help balance blood sugar. If unable to eat a whole meal include a balanced snack (see handout).  Include about the same amount of carbohydrates at each meal. Recommend starting with 2-3 carbohydrate servings (30-45 g) per meal. If this is not enough food can move to 3-4 carbohydrate servings/choices (45-60 g per meal)  Check blood sugar at least 2 times per day. Stick on side of finger to reduce pain of finger prick.  Fasting in the morning and 1 time 1-2 hours after a meal.     Expected Outcomes:  Demonstrated interest in learning. Expect positive outcomes  Education material provided: ADA - How to Thrive: A Guide for Your Journey with Diabetes, Meal plan card, My Plate and Snack sheet  If problems  or questions, patient to contact team via:  Phone and Email  Future DSME appointment: 4-6 wks

## 2018-09-29 NOTE — Patient Instructions (Addendum)
Instructions/Goals:  Make sure to get in three meals per day. Try to have balanced meals like the plate example (see handout). Include lean proteins, vegetables, fruits, and whole grains at meals.   Eat 3 meals/eat every 4-5 hours while awake to help balance blood sugar. If unable to eat a whole meal include a balanced snack (see handout).   Include about the same amount of carbohydrates at each meal. Recommend starting with 2-3 carbohydrate servings (30-45 g) per meal. If this is not enough food can move to 3-4 carbohydrate servings/choices (45-60 g per meal)  Check blood sugar at least 2 times per day. Stick on side of finger to reduce pain of finger prick.   Fasting in the morning and 1 time 1-2 hours after a meal.

## 2018-10-04 DIAGNOSIS — G63 Polyneuropathy in diseases classified elsewhere: Secondary | ICD-10-CM | POA: Diagnosis not present

## 2018-10-04 DIAGNOSIS — R200 Anesthesia of skin: Secondary | ICD-10-CM | POA: Diagnosis not present

## 2018-10-04 DIAGNOSIS — E1142 Type 2 diabetes mellitus with diabetic polyneuropathy: Secondary | ICD-10-CM | POA: Diagnosis not present

## 2018-10-04 DIAGNOSIS — E1140 Type 2 diabetes mellitus with diabetic neuropathy, unspecified: Secondary | ICD-10-CM | POA: Diagnosis not present

## 2018-10-05 DIAGNOSIS — B351 Tinea unguium: Secondary | ICD-10-CM | POA: Diagnosis not present

## 2018-10-05 DIAGNOSIS — M2042 Other hammer toe(s) (acquired), left foot: Secondary | ICD-10-CM | POA: Diagnosis not present

## 2018-10-05 DIAGNOSIS — M2041 Other hammer toe(s) (acquired), right foot: Secondary | ICD-10-CM | POA: Diagnosis not present

## 2018-10-05 DIAGNOSIS — E1140 Type 2 diabetes mellitus with diabetic neuropathy, unspecified: Secondary | ICD-10-CM | POA: Diagnosis not present

## 2018-10-05 DIAGNOSIS — L84 Corns and callosities: Secondary | ICD-10-CM | POA: Diagnosis not present

## 2018-10-11 DIAGNOSIS — E113513 Type 2 diabetes mellitus with proliferative diabetic retinopathy with macular edema, bilateral: Secondary | ICD-10-CM | POA: Diagnosis not present

## 2018-10-11 DIAGNOSIS — H2513 Age-related nuclear cataract, bilateral: Secondary | ICD-10-CM | POA: Diagnosis not present

## 2018-10-11 DIAGNOSIS — H3582 Retinal ischemia: Secondary | ICD-10-CM | POA: Diagnosis not present

## 2018-11-03 ENCOUNTER — Ambulatory Visit: Payer: Medicare Other | Admitting: Registered"

## 2018-11-26 DIAGNOSIS — M25532 Pain in left wrist: Secondary | ICD-10-CM | POA: Diagnosis not present

## 2018-12-23 DIAGNOSIS — I10 Essential (primary) hypertension: Secondary | ICD-10-CM | POA: Diagnosis not present

## 2018-12-23 DIAGNOSIS — R011 Cardiac murmur, unspecified: Secondary | ICD-10-CM | POA: Diagnosis not present

## 2018-12-23 DIAGNOSIS — Z0000 Encounter for general adult medical examination without abnormal findings: Secondary | ICD-10-CM | POA: Diagnosis not present

## 2018-12-23 DIAGNOSIS — H9313 Tinnitus, bilateral: Secondary | ICD-10-CM | POA: Diagnosis not present

## 2019-01-02 DIAGNOSIS — L84 Corns and callosities: Secondary | ICD-10-CM | POA: Diagnosis not present

## 2019-01-02 DIAGNOSIS — B351 Tinea unguium: Secondary | ICD-10-CM | POA: Diagnosis not present

## 2019-01-02 DIAGNOSIS — M2041 Other hammer toe(s) (acquired), right foot: Secondary | ICD-10-CM | POA: Diagnosis not present

## 2019-01-02 DIAGNOSIS — M2042 Other hammer toe(s) (acquired), left foot: Secondary | ICD-10-CM | POA: Diagnosis not present

## 2019-01-02 DIAGNOSIS — E1140 Type 2 diabetes mellitus with diabetic neuropathy, unspecified: Secondary | ICD-10-CM | POA: Diagnosis not present

## 2019-01-03 DIAGNOSIS — E113292 Type 2 diabetes mellitus with mild nonproliferative diabetic retinopathy without macular edema, left eye: Secondary | ICD-10-CM | POA: Diagnosis not present

## 2019-01-03 DIAGNOSIS — H2513 Age-related nuclear cataract, bilateral: Secondary | ICD-10-CM | POA: Diagnosis not present

## 2019-01-03 DIAGNOSIS — E113211 Type 2 diabetes mellitus with mild nonproliferative diabetic retinopathy with macular edema, right eye: Secondary | ICD-10-CM | POA: Diagnosis not present

## 2019-01-11 DIAGNOSIS — Z1211 Encounter for screening for malignant neoplasm of colon: Secondary | ICD-10-CM | POA: Diagnosis not present

## 2019-01-11 DIAGNOSIS — Z79899 Other long term (current) drug therapy: Secondary | ICD-10-CM | POA: Diagnosis not present

## 2019-01-11 DIAGNOSIS — Z0001 Encounter for general adult medical examination with abnormal findings: Secondary | ICD-10-CM | POA: Diagnosis not present

## 2019-01-11 DIAGNOSIS — Z1159 Encounter for screening for other viral diseases: Secondary | ICD-10-CM | POA: Diagnosis not present

## 2019-01-11 DIAGNOSIS — E1142 Type 2 diabetes mellitus with diabetic polyneuropathy: Secondary | ICD-10-CM | POA: Diagnosis not present

## 2019-01-25 DIAGNOSIS — H40003 Preglaucoma, unspecified, bilateral: Secondary | ICD-10-CM | POA: Diagnosis not present

## 2019-01-25 DIAGNOSIS — H2511 Age-related nuclear cataract, right eye: Secondary | ICD-10-CM | POA: Diagnosis not present

## 2019-01-25 DIAGNOSIS — H5213 Myopia, bilateral: Secondary | ICD-10-CM | POA: Diagnosis not present

## 2019-01-25 DIAGNOSIS — H25013 Cortical age-related cataract, bilateral: Secondary | ICD-10-CM | POA: Diagnosis not present

## 2019-01-25 DIAGNOSIS — H2513 Age-related nuclear cataract, bilateral: Secondary | ICD-10-CM | POA: Diagnosis not present

## 2019-02-01 DIAGNOSIS — I5040 Unspecified combined systolic (congestive) and diastolic (congestive) heart failure: Secondary | ICD-10-CM | POA: Diagnosis not present

## 2019-02-01 DIAGNOSIS — I110 Hypertensive heart disease with heart failure: Secondary | ICD-10-CM | POA: Diagnosis not present

## 2019-02-01 DIAGNOSIS — R011 Cardiac murmur, unspecified: Secondary | ICD-10-CM | POA: Diagnosis not present

## 2019-02-01 DIAGNOSIS — E11319 Type 2 diabetes mellitus with unspecified diabetic retinopathy without macular edema: Secondary | ICD-10-CM | POA: Diagnosis not present

## 2019-02-02 ENCOUNTER — Other Ambulatory Visit: Payer: Self-pay | Admitting: *Deleted

## 2019-02-02 DIAGNOSIS — C61 Malignant neoplasm of prostate: Secondary | ICD-10-CM

## 2019-02-02 NOTE — Telephone Encounter (Signed)
Received a fax request from [facility] to refill ASA 81mg  (historical med) & Vitamin D3.  Okay to refill?  Please be sure a 90 day supply is sent to [facility] if agreeable.

## 2019-02-04 MED ORDER — TAMSULOSIN HCL 0.4 MG PO CAPS: 0.4000 mg | ORAL_CAPSULE | Freq: Every day | ORAL | 3 refills | Status: DC

## 2019-02-04 MED ORDER — ATORVASTATIN CALCIUM 40 MG PO TABS: 40.0000 mg | ORAL_TABLET | Freq: Every day | ORAL | 3 refills | Status: DC

## 2019-02-04 MED ORDER — DULOXETINE HCL 30 MG PO CPEP: 30.0000 mg | ORAL_CAPSULE | Freq: Every day | ORAL | 1 refills | Status: DC

## 2019-02-04 MED ORDER — VITAMIN D3 50 MCG (2000 UT) PO CAPS: 2000.0000 [IU] | ORAL_CAPSULE | Freq: Every day | ORAL | 3 refills | Status: DC

## 2019-02-06 DIAGNOSIS — Z789 Other specified health status: Secondary | ICD-10-CM | POA: Diagnosis not present

## 2019-02-07 DIAGNOSIS — R200 Anesthesia of skin: Secondary | ICD-10-CM | POA: Diagnosis not present

## 2019-02-07 DIAGNOSIS — E1142 Type 2 diabetes mellitus with diabetic polyneuropathy: Secondary | ICD-10-CM | POA: Diagnosis not present

## 2019-02-07 DIAGNOSIS — G63 Polyneuropathy in diseases classified elsewhere: Secondary | ICD-10-CM | POA: Diagnosis not present

## 2019-02-07 DIAGNOSIS — E1140 Type 2 diabetes mellitus with diabetic neuropathy, unspecified: Secondary | ICD-10-CM | POA: Diagnosis not present

## 2019-02-16 DIAGNOSIS — Z20822 Contact with and (suspected) exposure to covid-19: Secondary | ICD-10-CM | POA: Diagnosis not present

## 2019-02-16 DIAGNOSIS — H2511 Age-related nuclear cataract, right eye: Secondary | ICD-10-CM | POA: Diagnosis not present

## 2019-02-16 DIAGNOSIS — Z01812 Encounter for preprocedural laboratory examination: Secondary | ICD-10-CM | POA: Diagnosis not present

## 2019-02-21 DIAGNOSIS — I2510 Atherosclerotic heart disease of native coronary artery without angina pectoris: Secondary | ICD-10-CM | POA: Diagnosis not present

## 2019-02-21 DIAGNOSIS — Z951 Presence of aortocoronary bypass graft: Secondary | ICD-10-CM | POA: Diagnosis not present

## 2019-02-21 DIAGNOSIS — H2511 Age-related nuclear cataract, right eye: Secondary | ICD-10-CM | POA: Diagnosis not present

## 2019-02-21 DIAGNOSIS — H25011 Cortical age-related cataract, right eye: Secondary | ICD-10-CM | POA: Diagnosis not present

## 2019-02-21 DIAGNOSIS — H25811 Combined forms of age-related cataract, right eye: Secondary | ICD-10-CM | POA: Diagnosis not present

## 2019-02-23 DIAGNOSIS — I10 Essential (primary) hypertension: Secondary | ICD-10-CM | POA: Diagnosis not present

## 2019-02-23 DIAGNOSIS — E1140 Type 2 diabetes mellitus with diabetic neuropathy, unspecified: Secondary | ICD-10-CM | POA: Diagnosis not present

## 2019-02-23 DIAGNOSIS — E7800 Pure hypercholesterolemia, unspecified: Secondary | ICD-10-CM | POA: Diagnosis not present

## 2019-02-23 DIAGNOSIS — E1165 Type 2 diabetes mellitus with hyperglycemia: Secondary | ICD-10-CM | POA: Diagnosis not present

## 2019-03-15 DIAGNOSIS — E785 Hyperlipidemia, unspecified: Secondary | ICD-10-CM | POA: Diagnosis not present

## 2019-03-15 DIAGNOSIS — F331 Major depressive disorder, recurrent, moderate: Secondary | ICD-10-CM | POA: Diagnosis not present

## 2019-03-15 DIAGNOSIS — E1142 Type 2 diabetes mellitus with diabetic polyneuropathy: Secondary | ICD-10-CM | POA: Diagnosis not present

## 2019-03-15 DIAGNOSIS — E11319 Type 2 diabetes mellitus with unspecified diabetic retinopathy without macular edema: Secondary | ICD-10-CM | POA: Diagnosis not present

## 2019-03-15 DIAGNOSIS — E669 Obesity, unspecified: Secondary | ICD-10-CM | POA: Diagnosis not present

## 2019-03-15 DIAGNOSIS — I779 Disorder of arteries and arterioles, unspecified: Secondary | ICD-10-CM | POA: Diagnosis not present

## 2019-03-15 DIAGNOSIS — I25118 Atherosclerotic heart disease of native coronary artery with other forms of angina pectoris: Secondary | ICD-10-CM | POA: Diagnosis not present

## 2019-03-15 DIAGNOSIS — E1169 Type 2 diabetes mellitus with other specified complication: Secondary | ICD-10-CM | POA: Diagnosis not present

## 2019-03-15 DIAGNOSIS — E1151 Type 2 diabetes mellitus with diabetic peripheral angiopathy without gangrene: Secondary | ICD-10-CM | POA: Diagnosis not present

## 2019-03-16 NOTE — Progress Notes (Signed)
Cardiology Office Note    Date:  03/17/2019   ID:  Clinton Kirby, Clinton Kirby Jan 29, 1946, MRN [id number]  PCP:  Clinton Anger, MD  Cardiologist:  Clinton Siegrist Martinique, MD    History of Present Illness:  Clinton Kirby is a 74 y.o. male who is seen for evaluation of syncope. He has a known history of coronary disease and  poorly controlled diabetes mellitus type 2 as well as hyperlipidemia, hypertension and peripheral neuropathy.  He is status post inferior myocardial infarction in February of 2004 while in [place]. This was treated with a drug-eluting stent to the right coronary. In 2005 he had some abnormality on a stress test and underwent repeat cardiac catheterization. This showed the stent to be widely patent and he had no other significant disease. Stress test here was in July 2014 showed a fixed inferior wall scar and mild LV dysfunction.   He is  s/p CABG in [facility] on 11/11/15. H was admitted  with chest pain. He ruled out for MI.  He  had an abnormal Myoview showing inferior and apical scar with ischemia in the anterior wall and apex. EF 28%.  He underwent cardiac cath 11/06/15. His cath showed a patent RCA stent but an occluded LAD and 70% stenosis in the OM1 and OM2.  He underwent CABG x 1 with LIMA-LAD 11/11/15 by Dr Clinton Kirby. The OMs were felt to be too small for bypass. Post OP echo 11/13/15 showed his EF to be 40%. Carotid dopplers showed no obstructive disease. It was noted on cardiac cath that his right innominate artery was very tortuous.   Cath films reviewed personally by me:  11/07/15. This showed occlusion of the proximal LAD with some collateral. There was a large ramus vessel that bifurcated in the mid vessel. There was 40-50% disease at the bifurcation. The first OM was a moderate sized vessel with 80-90% stenosis. The second OM was smaller with a 70% stenosis. The RCA was patent in the mid vessel at site of prior stent. There was diffuse 40% disease in the  proximal vessel. It is surprising that the OMs were too small for bypass. If he has refractory angina could be considered for PCI of OM1. On a prior  visit losartan was added for LV dysfunction.  On follow up today he reports an episode of syncope this past Sunday. It was in the morning prior to eating. He was reaching up for something in the cabinet when he passed out completely. He wife helped catch him and ease him to the floor. States he was out seconds. No confusion. Mild sweating. No chest pain, dyspnea, incontinence or seizure activity. He had no trauma. BS was 225. He felt fine afterwards and has had no recurrence since then. His activity has been reduced due to pandemic. Reports sugars are still high.  Past Medical History:  Diagnosis Date  . Balanitis   . CAD (coronary artery disease)   . Depression   . Diabetes mellitus type II   . ED (erectile dysfunction)   . HTN (hypertension)   . Hyperlipidemia   . Old MI (myocardial infarction) 12/07/2013  . Prostate cancer [facility])     Past Surgical History:  Procedure Laterality Date  . CORONARY ARTERY BYPASS GRAFT  10/2015  . PROSTATE BIOPSY    . UMBILICAL HERNIA REPAIR      Current Medications: Outpatient Medications Prior to Visit  Medication Sig Dispense Refill  . aspirin 81 MG tablet Take  81 mg by mouth daily.    [name] b complex vitamins tablet Take 1 tablet by mouth daily. 100 tablet 3  . Blood Glucose Monitoring Suppl (ONETOUCH VERIO IQ SYSTEM) w/Device KIT USE TO CHECK BLOOD SUGAR  DAILY. DX E11.9 1 kit 0  . carvedilol (COREG) 25 MG tablet Take 1 tablet (25 mg total) by mouth 2 (two) times daily. 180 tablet 3  . Cholecalciferol (VITAMIN D3) 50 MCG (2000 UT) capsule Take 1 capsule (2,000 Units total) by mouth daily. 100 capsule 3  . gabapentin (NEURONTIN) 300 MG capsule Take 1 capsule (300 mg total) by mouth at bedtime. 90 capsule 3  . glipiZIDE (GLUCOTROL XL) 2.5 MG 24 hr tablet Take 1 tablet (2.5 mg total) by mouth daily with  breakfast. 90 tablet 3  . glucose blood (ONETOUCH VERIO) test strip 1 each by Other route daily. Use to check blood sugars twice a day 300 each 1  . ONETOUCH DELICA LANCETS FINE MISC Use to help check blood sugars twice a day. Dx E11.9 300 each 1  . SITagliptin-metFORMIN HCl (JANUMET PO) Take by mouth.    . tamsulosin (FLOMAX) 0.4 MG CAPS capsule Take 1 capsule (0.4 mg total) by mouth daily after supper. 90 capsule 3  . atorvastatin (LIPITOR) 40 MG tablet Take 1 tablet (40 mg total) by mouth daily. 90 tablet 3  . clotrimazole-betamethasone (LOTRISONE) cream Apply topically 2 (two) times daily. (Patient not taking: Reported on 09/29/2018) 45 g 1  . DULoxetine (CYMBALTA) 30 MG capsule Take 1 capsule (30 mg total) by mouth daily. (Patient not taking: Reported on 03/17/2019) 90 capsule 1  . meclizine (ANTIVERT) 25 MG tablet TAKE 1 TABLET BY MOUTH THREE TIMES DAILY AS NEEDED FOR  DIZZINESS (Patient not taking: Reported on 03/17/2019) 20 tablet 0  . metFORMIN (GLUCOPHAGE) 1000 MG tablet Take 1 tablet (1,000 mg total) by mouth 2 (two) times daily with a meal. (Patient not taking: Reported on 09/29/2018) 180 tablet 3  . losartan (COZAAR) 100 MG tablet Take 1 tablet (100 mg total) by mouth daily. 90 tablet 3   Facility-Administered Medications Prior to Visit  Medication Dose Route Frequency Provider Last Rate Last Admin  . aspirin chewable tablet 81 mg  81 mg Oral Once Kirby, Clinton Lasecki M, MD         Allergies:   Vania Rea [empagliflozin]   Social History   Socioeconomic History  . Marital status: Married    Spouse name: Not on file  . Number of children: 1  . Years of education: Not on file  . Highest education level: Not on file  Occupational History  . Occupation: truck driver-self employed  Tobacco Use  . Smoking status: Never Smoker  . Smokeless tobacco: Never Used  Substance and Sexual Activity  . Alcohol use: No    Alcohol/week: 0.0 standard drinks  . Drug use: No  . Sexual activity: Yes    Other Topics Concern  . Not on file  Social History Narrative  . Not on file   Social Determinants of Health   Financial Resource Strain:   . Difficulty of Paying Living Expenses: Not on file  Food Insecurity:   . Worried About Charity fundraiser in the Last Year: Not on file  . Ran Out of Food in the Last Year: Not on file  Transportation Needs:   . Lack of Transportation (Medical): Not on file  . Lack of Transportation (Non-Medical): Not on file  Physical Activity:   . Days  of Exercise per Week: Not on file  . Minutes of Exercise per Session: Not on file  Stress:   . Feeling of Stress : Not on file  Social Connections:   . Frequency of Communication with Friends and Family: Not on file  . Frequency of Social Gatherings with Friends and Family: Not on file  . Attends Religious Services: Not on file  . Active Member of Clubs or Organizations: Not on file  . Attends Archivist Meetings: Not on file  . Marital Status: Not on file     Family History:  The patient's family history includes Cancer in his father; Diabetes in his mother and another family member; Hyperlipidemia in his mother; Hypertension in his mother.   ROS:   Please see the history of present illness.    ROS All other systems reviewed and are negative.   PHYSICAL EXAM:   VS:  BP (!) 150/84   Pulse 83   Ht 6' (1.829 m)   Wt 243 lb 8 oz (110.5 kg)   BMI 33.02 kg/m    GENERAL:  Well appearing overweight BM in NAD HEENT:  PERRL, EOMI, sclera are clear. Oropharynx is clear. NECK:  No jugular venous distention, carotid upstroke brisk and symmetric, no bruits, no thyromegaly or adenopathy LUNGS:  Clear to auscultation bilaterally CHEST:  Unremarkable HEART:  RRR,  PMI not displaced or sustained,S1 and S2 within normal limits, no S3, no S4: no clicks, no rubs, no murmurs ABD:  Soft, nontender. BS +, no masses or bruits. No hepatomegaly, no splenomegaly EXT:  2 + pulses throughout, no edema, no  cyanosis no clubbing SKIN:  Warm and dry.  No rashes NEURO:  Alert and oriented x 3. Cranial nerves II through XII intact. PSYCH:  Cognitively intact      Wt Readings from Last 3 Encounters:  03/17/19 243 lb 8 oz (110.5 kg)  09/29/18 245 lb 12.8 oz (111.5 kg)  08/09/18 243 lb (110.2 kg)      Studies/Labs Reviewed:   EKG:  EKG is not ordered today.    Recent Labs: 04/18/2018: ALT 20; BUN 17; Creatinine, Ser 1.03; Hemoglobin 13.9; Platelets 252.0; Potassium 4.1; Sodium 137; TSH 1.91  Dated 07/19/17: A1c 8.1%.  Dated 08/05/17 A1c 9.1% Dated 11/26/18: glucose 302 otherwise CMET normal.  Lipid Panel    Component Value Date/Time   CHOL 175 04/02/2017 0955   TRIG 210.0 (H) 04/02/2017 0955   HDL 45.40 04/02/2017 0955   CHOLHDL 4 04/02/2017 0955   VLDL 42.0 (H) 04/02/2017 0955   LDLCALC 49 01/31/2016 1411   LDLDIRECT 106.0 05/14/2017 1025    Additional studies/ records that were reviewed today include:  Echo 05/07/16: Study Conclusions  - Left ventricle: Septal apical and inferobasal hypokinesis The   cavity size was normal. Wall thickness was increased in a pattern   of mild LVH. Systolic function was mildly to moderately reduced.   The estimated ejection fraction was in the range of 40% to 45%.   Wall motion was normal; there were no regional wall motion   abnormalities. Left ventricular diastolic function parameters   were normal. - Atrial septum: No defect or patent foramen ovale was identified.   ASSESSMENT:    1. Essential hypertension   2. Old MI (myocardial infarction)   3. CAD S/P percutaneous coronary angioplasty   4. Syncope and collapse      PLAN:  In order of problems listed above:  1. CAD: Patient is s/p CABG with  LIMA to the LAD. Old stent in RCA. Residual disease in the OMs and Ramus. He is asymptomatic. Continue current therapy.  Continue ASA, beta blocker.  2. Chronic systolic CHF. Last EF was  40-45%. He is asymptomatic. Will continue Coreg  25  mg bid,  losartan 50 mg daily. Reports BP last week 212 systolic.  3. Dyslipidemia  Goal LDL < 70. On atorvastatin 40 mg. No recent labs. Will check CMET and lipids today. 4. Prostate CA-s/p RT 5.   DM type 2 with neuropathy. Per endocrinology. 6.   Syncope. History most concerning for arrhythmia. Will have him wear an event monitor. Check Echo. Check above labs plus CBC, magnesium level. Follow up in 6 weeks.   Medication Adjustments/Labs and Tests Ordered: Current medicines are reviewed at length with the patient today.  Concerns regarding medicines are outlined above.  Medication changes, Labs and Tests ordered today are listed in the Patient Instructions below. There are no Patient Instructions on file for this visit.   Signed, Elwin Tsou Martinique, MD  03/17/2019 2:03 PM    [facility] [address] [place], [place], [address] [phone]

## 2019-03-17 ENCOUNTER — Ambulatory Visit: Payer: Medicare HMO | Admitting: Cardiology

## 2019-03-17 ENCOUNTER — Telehealth: Payer: Self-pay | Admitting: Radiology

## 2019-03-17 ENCOUNTER — Encounter: Payer: Self-pay | Admitting: Cardiology

## 2019-03-17 ENCOUNTER — Other Ambulatory Visit: Payer: Self-pay

## 2019-03-17 VITALS — BP 150/84 | HR 83 | Ht 72.0 in | Wt 243.5 lb

## 2019-03-17 DIAGNOSIS — I2510 Atherosclerotic heart disease of native coronary artery without angina pectoris: Secondary | ICD-10-CM | POA: Diagnosis not present

## 2019-03-17 DIAGNOSIS — I5022 Chronic systolic (congestive) heart failure: Secondary | ICD-10-CM | POA: Diagnosis not present

## 2019-03-17 DIAGNOSIS — R55 Syncope and collapse: Secondary | ICD-10-CM

## 2019-03-17 DIAGNOSIS — Z951 Presence of aortocoronary bypass graft: Secondary | ICD-10-CM | POA: Diagnosis not present

## 2019-03-17 DIAGNOSIS — Z9861 Coronary angioplasty status: Secondary | ICD-10-CM

## 2019-03-17 DIAGNOSIS — I10 Essential (primary) hypertension: Secondary | ICD-10-CM

## 2019-03-17 DIAGNOSIS — I252 Old myocardial infarction: Secondary | ICD-10-CM

## 2019-03-17 MED ORDER — LOSARTAN POTASSIUM 50 MG PO TABS: 50.0000 mg | ORAL_TABLET | Freq: Every day | ORAL | 3 refills | Status: AC

## 2019-03-17 NOTE — Patient Instructions (Signed)
Medication Instructions:  Continue same medications   Lab Work: Cmet,lipid,cbc,magnesium   Testing/Procedures: Schedule Echo Schedule 30 day Event Monitor Follow-Up: At [facility], you and your health needs are our priority.  As part of our continuing mission to provide you with exceptional heart care, we have created designated Provider Care Teams.  These Care Teams include your primary Cardiologist (physician) and Advanced Practice Providers (APPs -  Physician Assistants and Nurse Practitioners) who all work together to provide you with the care you need, when you need it.  Your next appointment:  6 weeks   The format for your next appointment:  Office   Provider:  Dr.Jordan

## 2019-03-17 NOTE — Telephone Encounter (Signed)
Enrolled patient for a 30 day [employer] Event monitor to be mailed to patients home.  

## 2019-03-18 LAB — CBC WITH DIFFERENTIAL/PLATELET
Basophils Absolute: 0.1 10*3/uL (ref 0.0–0.2)
Basos: 1 %
EOS (ABSOLUTE): 0.2 10*3/uL (ref 0.0–0.4)
Eos: 4 %
Hematocrit: 38.4 % (ref 37.5–51.0)
Hemoglobin: 13.3 g/dL (ref 13.0–17.7)
Immature Grans (Abs): 0 10*3/uL (ref 0.0–0.1)
Immature Granulocytes: 0 %
Lymphocytes Absolute: 1.5 10*3/uL (ref 0.7–3.1)
Lymphs: 24 %
MCH: 30.6 pg (ref 26.6–33.0)
MCHC: 34.6 g/dL (ref 31.5–35.7)
MCV: 89 fL (ref 79–97)
Monocytes Absolute: 0.5 10*3/uL (ref 0.1–0.9)
Monocytes: 8 %
Neutrophils Absolute: 3.9 10*3/uL (ref 1.4–7.0)
Neutrophils: 63 %
Platelets: 227 10*3/uL (ref 150–450)
RBC: 4.34 x10E6/uL (ref 4.14–5.80)
RDW: 13 % (ref 11.6–15.4)
WBC: 6.2 10*3/uL (ref 3.4–10.8)

## 2019-03-18 LAB — COMPREHENSIVE METABOLIC PANEL
ALT: 20 IU/L (ref 0–44)
AST: 11 IU/L (ref 0–40)
Albumin/Globulin Ratio: 2.2 (ref 1.2–2.2)
Albumin: 4.1 g/dL (ref 3.7–4.7)
Alkaline Phosphatase: 77 IU/L (ref 39–117)
BUN/Creatinine Ratio: 13 (ref 10–24)
BUN: 13 mg/dL (ref 8–27)
Bilirubin Total: 0.4 mg/dL (ref 0.0–1.2)
CO2: 22 mmol/L (ref 20–29)
Calcium: 9.6 mg/dL (ref 8.6–10.2)
Chloride: 99 mmol/L (ref 96–106)
Creatinine, Ser: 0.99 mg/dL (ref 0.76–1.27)
GFR calc Af Amer: 88 mL/min/{1.73_m2} (ref >59)
GFR calc non Af Amer: 76 mL/min/{1.73_m2} (ref >59)
Globulin, Total: 1.9 g/dL (ref 1.5–4.5)
Glucose: 265 mg/dL — ABNORMAL HIGH (ref 65–99)
Potassium: 4.8 mmol/L (ref 3.5–5.2)
Sodium: 135 mmol/L (ref 134–144)
Total Protein: 6 g/dL (ref 6.0–8.5)

## 2019-03-18 LAB — LIPID PANEL
Chol/HDL Ratio: 2.8 ratio (ref 0.0–5.0)
Cholesterol, Total: 138 mg/dL (ref 100–199)
HDL: 50 mg/dL (ref >39)
LDL Chol Calc (NIH): 62 mg/dL (ref 0–99)
Triglycerides: 150 mg/dL — ABNORMAL HIGH (ref 0–149)
VLDL Cholesterol Cal: 26 mg/dL (ref 5–40)

## 2019-03-18 LAB — MAGNESIUM: Magnesium: 2.1 mg/dL (ref 1.6–2.3)

## 2019-03-22 ENCOUNTER — Ambulatory Visit (HOSPITAL_BASED_OUTPATIENT_CLINIC_OR_DEPARTMENT_OTHER)
Admission: RE | Admit: 2019-03-22 | Discharge: 2019-03-22 | Disposition: A | Discharge status: Home / Self Care | Payer: Medicare HMO | Source: Ambulatory Visit | Attending: Cardiology | Admitting: Cardiology

## 2019-03-22 ENCOUNTER — Telehealth: Payer: Self-pay | Admitting: Cardiology

## 2019-03-22 ENCOUNTER — Other Ambulatory Visit: Payer: Self-pay

## 2019-03-22 DIAGNOSIS — Z9861 Coronary angioplasty status: Secondary | ICD-10-CM

## 2019-03-22 DIAGNOSIS — I10 Essential (primary) hypertension: Secondary | ICD-10-CM

## 2019-03-22 DIAGNOSIS — I252 Old myocardial infarction: Secondary | ICD-10-CM | POA: Insufficient documentation

## 2019-03-22 DIAGNOSIS — I2510 Atherosclerotic heart disease of native coronary artery without angina pectoris: Secondary | ICD-10-CM

## 2019-03-22 DIAGNOSIS — R55 Syncope and collapse: Secondary | ICD-10-CM | POA: Insufficient documentation

## 2019-03-22 MED ORDER — PERFLUTREN LIPID MICROSPHERE
1.0000 mL | INTRAVENOUS | Status: AC | PRN
  Administered 2019-03-22: 2 mL via INTRAVENOUS
  Filled 2019-03-22: qty 10

## 2019-03-22 NOTE — Telephone Encounter (Signed)
Patient aware of lab results and verbalized understanding.

## 2019-03-22 NOTE — Telephone Encounter (Signed)
Patient returning Cheryls call in regards to lab results.

## 2019-03-22 NOTE — Progress Notes (Signed)
  Echocardiogram 2D Echocardiogram has been performed.  Clinton Kirby 03/22/2019, 10:13 AM

## 2019-03-27 DIAGNOSIS — E1165 Type 2 diabetes mellitus with hyperglycemia: Secondary | ICD-10-CM | POA: Diagnosis not present

## 2019-03-27 DIAGNOSIS — E7800 Pure hypercholesterolemia, unspecified: Secondary | ICD-10-CM | POA: Diagnosis not present

## 2019-03-27 DIAGNOSIS — I10 Essential (primary) hypertension: Secondary | ICD-10-CM | POA: Diagnosis not present

## 2019-03-27 DIAGNOSIS — E1140 Type 2 diabetes mellitus with diabetic neuropathy, unspecified: Secondary | ICD-10-CM | POA: Diagnosis not present

## 2019-03-28 DIAGNOSIS — E113511 Type 2 diabetes mellitus with proliferative diabetic retinopathy with macular edema, right eye: Secondary | ICD-10-CM | POA: Diagnosis not present

## 2019-03-28 DIAGNOSIS — E113592 Type 2 diabetes mellitus with proliferative diabetic retinopathy without macular edema, left eye: Secondary | ICD-10-CM | POA: Diagnosis not present

## 2019-03-28 DIAGNOSIS — H2512 Age-related nuclear cataract, left eye: Secondary | ICD-10-CM | POA: Diagnosis not present

## 2019-03-29 DIAGNOSIS — H25012 Cortical age-related cataract, left eye: Secondary | ICD-10-CM | POA: Diagnosis not present

## 2019-03-29 DIAGNOSIS — H2512 Age-related nuclear cataract, left eye: Secondary | ICD-10-CM | POA: Diagnosis not present

## 2019-03-29 NOTE — Telephone Encounter (Signed)
Tried to return patients call. No answer will try again later.

## 2019-03-29 NOTE — Telephone Encounter (Signed)
Follow up   Patient has questions about heart monitor instructions. Please call.

## 2019-03-30 NOTE — Telephone Encounter (Signed)
Unable to reach patient LVM for patient to return call with any questions for they can also call the monitor company

## 2019-04-03 ENCOUNTER — Encounter: Payer: Self-pay | Admitting: Cardiology

## 2019-04-03 ENCOUNTER — Ambulatory Visit (INDEPENDENT_AMBULATORY_CARE_PROVIDER_SITE_OTHER): Payer: Medicare HMO

## 2019-04-03 DIAGNOSIS — Z951 Presence of aortocoronary bypass graft: Secondary | ICD-10-CM

## 2019-04-03 DIAGNOSIS — I252 Old myocardial infarction: Secondary | ICD-10-CM | POA: Diagnosis not present

## 2019-04-03 DIAGNOSIS — Z9861 Coronary angioplasty status: Secondary | ICD-10-CM

## 2019-04-03 DIAGNOSIS — I5022 Chronic systolic (congestive) heart failure: Secondary | ICD-10-CM

## 2019-04-03 DIAGNOSIS — I10 Essential (primary) hypertension: Secondary | ICD-10-CM | POA: Diagnosis not present

## 2019-04-03 DIAGNOSIS — I2510 Atherosclerotic heart disease of native coronary artery without angina pectoris: Secondary | ICD-10-CM

## 2019-04-03 DIAGNOSIS — R55 Syncope and collapse: Secondary | ICD-10-CM

## 2019-04-17 ENCOUNTER — Telehealth: Payer: Self-pay

## 2019-04-17 NOTE — Telephone Encounter (Signed)
Left message for patient to return call regarding [employer] monitor serious event.

## 2019-04-17 NOTE — Telephone Encounter (Signed)
[employer] strip stating the patient had a 5 beat run of V-Tach on 3/6. The patient stated he was unaware and was asymptomatic.  Dr. Martinique made aware. No changes at this point.

## 2019-04-17 NOTE — Telephone Encounter (Signed)
Patient is returning phone call.  °

## 2019-04-30 NOTE — Progress Notes (Deleted)
Cardiology Office Note  Date: 04/30/2019   ID: Clinton Kirby, DOB [birth year], MRN [id number]  PCP:  System, Pcp Not In  Cardiologist:  No primary care provider on file. Electrophysiologist:  None   Chief Complaint: Recent syncopal episode follow-up  History of Present Illness: Clinton Kirby is a 74 y.o. male with a history of syncope.  History of CAD (S/P inferior MI 03/2002), (S/P CABG [place] 2017), poorly controlled DM 2, HLD, HTN.  Recent syncopal episode prior to last visit on 03/17/2019.  He was reaching up for something in a cabinet and passed out completely.  He was out for only seconds.  No confusion or postictal symptoms.  He had no recurrences.  Blood sugars have been running high.  We need results from cardiac monitor.  Past Medical History:  Diagnosis Date  . Balanitis   . CAD (coronary artery disease)   . Depression   . Diabetes mellitus type II   . ED (erectile dysfunction)   . HTN (hypertension)   . Hyperlipidemia   . Old MI (myocardial infarction) 12/07/2013  . Prostate cancer [facility])     Past Surgical History:  Procedure Laterality Date  . CORONARY ARTERY BYPASS GRAFT  10/2015  . PROSTATE BIOPSY    . UMBILICAL HERNIA REPAIR      Current Outpatient Medications  Medication Sig Dispense Refill  . aspirin 81 MG tablet Take 81 mg by mouth daily.    [name] atorvastatin (LIPITOR) 40 MG tablet Take 1 tablet (40 mg total) by mouth daily. 90 tablet 3  . b complex vitamins tablet Take 1 tablet by mouth daily. 100 tablet 3  . Blood Glucose Monitoring Suppl (ONETOUCH VERIO IQ SYSTEM) w/Device KIT USE TO CHECK BLOOD SUGAR  DAILY. DX E11.9 1 kit 0  . carvedilol (COREG) 25 MG tablet Take 1 tablet (25 mg total) by mouth 2 (two) times daily. 180 tablet 3  . Cholecalciferol (VITAMIN D3) 50 MCG (2000 UT) capsule Take 1 capsule (2,000 Units total) by mouth daily. 100 capsule 3  . clotrimazole-betamethasone (LOTRISONE) cream Apply topically 2 (two) times daily.  (Patient not taking: Reported on 09/29/2018) 45 g 1  . DULoxetine (CYMBALTA) 30 MG capsule Take 1 capsule (30 mg total) by mouth daily. (Patient not taking: Reported on 03/17/2019) 90 capsule 1  . gabapentin (NEURONTIN) 300 MG capsule Take 1 capsule (300 mg total) by mouth at bedtime. 90 capsule 3  . glipiZIDE (GLUCOTROL XL) 2.5 MG 24 hr tablet Take 1 tablet (2.5 mg total) by mouth daily with breakfast. 90 tablet 3  . glucose blood (ONETOUCH VERIO) test strip 1 each by Other route daily. Use to check blood sugars twice a day 300 each 1  . losartan (COZAAR) 50 MG tablet Take 1 tablet (50 mg total) by mouth daily. 90 tablet 3  . meclizine (ANTIVERT) 25 MG tablet TAKE 1 TABLET BY MOUTH THREE TIMES DAILY AS NEEDED FOR  DIZZINESS (Patient not taking: Reported on 03/17/2019) 20 tablet 0  . metFORMIN (GLUCOPHAGE) 1000 MG tablet Take 1 tablet (1,000 mg total) by mouth 2 (two) times daily with a meal. (Patient not taking: Reported on 09/29/2018) 180 tablet 3  . ONETOUCH DELICA LANCETS FINE MISC Use to help check blood sugars twice a day. Dx E11.9 300 each 1  . SITagliptin-metFORMIN HCl (JANUMET PO) Take by mouth.    . tamsulosin (FLOMAX) 0.4 MG CAPS capsule Take 1 capsule (0.4 mg total) by mouth daily after supper. 90 capsule  3   Current Facility-Administered Medications  Medication Dose Route Frequency Provider Last Rate Last Admin  . aspirin chewable tablet 81 mg  81 mg Oral Once Martinique, Peter M, MD       Allergies:  Vania Rea [empagliflozin]   Social History: The patient  reports that he has never smoked. He has never used smokeless tobacco. He reports that he does not drink alcohol or use drugs.   Family History: The patient's family history includes Cancer in his father; Diabetes in his mother and another family member; Hyperlipidemia in his mother; Hypertension in his mother.   ROS:  Please see the history of present illness. Otherwise, complete review of systems is positive for {NONE  DEFAULTED:18576::"none"}.  All other systems are reviewed and negative.  ROS  Physical Exam: VS:  There were no vitals taken for this visit., BMI There is no height or weight on file to calculate BMI.  Wt Readings from Last 3 Encounters:  03/17/19 243 lb 8 oz (110.5 kg)  09/29/18 245 lb 12.8 oz (111.5 kg)  08/09/18 243 lb (110.2 kg)    General: Patient appears comfortable at rest. HEENT: Conjunctiva and lids normal, oropharynx clear with moist mucosa. Neck: Supple, no elevated JVP or carotid bruits, no thyromegaly. Lungs: Clear to auscultation, nonlabored breathing at rest. Cardiac: Regular rate and rhythm, no S3 or significant systolic murmur, no pericardial rub. Abdomen: Soft, nontender, no hepatomegaly, bowel sounds present, no guarding or rebound. Extremities: No pitting edema, distal pulses 2+. Skin: Warm and dry. Musculoskeletal: No kyphosis. Neuropsychiatric: Alert and oriented x3, affect grossly appropriate.  ECG:  {EKG/Telemetry Strips Reviewed:[phone]}  Recent Labwork: 03/17/2019: ALT 20; AST 11; BUN 13; Creatinine, Ser 0.99; Hemoglobin 13.3; Magnesium 2.1; Platelets 227; Potassium 4.8; Sodium 135     Component Value Date/Time   CHOL 138 03/17/2019 1418   TRIG 150 (H) 03/17/2019 1418   HDL 50 03/17/2019 1418   CHOLHDL 2.8 03/17/2019 1418   CHOLHDL 4 04/02/2017 0955   VLDL 42.0 (H) 04/02/2017 0955   LDLCALC 62 03/17/2019 1418   LDLDIRECT 106.0 05/14/2017 1025    Other Studies Reviewed Today:  Echocardiogram 03/22/2019  1. Left ventricular ejection fraction, by estimation, is 45%. The left ventricle has mildly decreased function. The left ventrical has regional wall motion abnormalities. There is moderately increased concentric left ventricular hypertrophy. 2. Right ventricular systolic function is mildly reduced. The right ventricular size is normal. There is mildly elevated pulmonary artery systolic pressure. 3. The mitral valve is normal in structure and  function. Mild mitral valve regurgitation. No evidence of mitral stenosis.    Assessment and Plan:  No diagnosis found.   Medication Adjustments/Labs and Tests Ordered: Current medicines are reviewed at length with the patient today.  Concerns regarding medicines are outlined above.   Disposition: Follow-up with ***  Signed, Levell July, NP 04/30/2019 10:52 PM    [facility]

## 2019-05-01 ENCOUNTER — Ambulatory Visit: Payer: Medicare Other | Admitting: Physician Assistant

## 2019-05-11 ENCOUNTER — Ambulatory Visit (INDEPENDENT_AMBULATORY_CARE_PROVIDER_SITE_OTHER): Payer: Medicare Other | Admitting: Physician Assistant

## 2019-05-11 ENCOUNTER — Encounter: Payer: Self-pay | Admitting: Physician Assistant

## 2019-05-11 ENCOUNTER — Other Ambulatory Visit: Payer: Self-pay

## 2019-05-11 VITALS — BP 120/60 | HR 80 | Temp 96.3°F | Ht 72.0 in | Wt 246.4 lb

## 2019-05-11 DIAGNOSIS — M79605 Pain in left leg: Secondary | ICD-10-CM

## 2019-05-11 DIAGNOSIS — R55 Syncope and collapse: Secondary | ICD-10-CM

## 2019-05-11 DIAGNOSIS — I25810 Atherosclerosis of coronary artery bypass graft(s) without angina pectoris: Secondary | ICD-10-CM | POA: Diagnosis not present

## 2019-05-11 DIAGNOSIS — E119 Type 2 diabetes mellitus without complications: Secondary | ICD-10-CM

## 2019-05-11 DIAGNOSIS — M79604 Pain in right leg: Secondary | ICD-10-CM

## 2019-05-11 DIAGNOSIS — I10 Essential (primary) hypertension: Secondary | ICD-10-CM | POA: Diagnosis not present

## 2019-05-11 DIAGNOSIS — E785 Hyperlipidemia, unspecified: Secondary | ICD-10-CM

## 2019-05-11 NOTE — Patient Instructions (Addendum)
Medication Instructions:  Your physician recommends that you continue on your current medications as directed. Please refer to the Current Medication list given to you today.  *If you need a refill on your cardiac medications before your next appointment, please call your pharmacy*  Lab Work: NONE ordered at this time of appointment   If you have labs (blood work) drawn today and your tests are completely normal, you will receive your results only by: [name] MyChart Message (if you have MyChart) OR . A paper copy in the mail If you have any lab test that is abnormal or we need to change your treatment, we will call you to review the results.  Testing/Procedures: Your physician has requested that you have an ankle brachial index (ABI). During this test an ultrasound and blood pressure cuff are used to evaluate the arteries that supply the arms and legs with blood. Allow thirty minutes for this exam. There are no restrictions or special instructions. This will be done at [place] 2-3 [place] has requested that you have a lower extremity arterial exercise duplex. During this test, exercise and ultrasound are used to evaluate arterial blood flow in the legs. Allow one hour for this exam. There are no restrictions or special instructions. This will be done at [place] 2-3 WEEKS  Follow-Up: At [facility], you and your health needs are our priority.  As part of our continuing mission to provide you with exceptional heart care, we have created designated Provider Care Teams.  These Care Teams include your primary Cardiologist (physician) and Advanced Practice Providers (APPs -  Physician Assistants and Nurse Practitioners) who all work together to provide you with the care you need, when you need it.  We recommend signing up for the patient portal called "MyChart".  Sign up information is provided on  this After Visit Summary.  MyChart is used to connect with patients for Virtual Visits (Telemedicine).  Patients are able to view lab/test results, encounter notes, upcoming appointments, etc.  Non-urgent messages can be sent to your provider as well.   To learn more about what you can do with MyChart, go to [email or web address].    Your next appointment:   3 month(s)  The format for your next appointment:   In Person  Provider:   [name], MD  Other Instructions

## 2019-05-11 NOTE — Progress Notes (Signed)
Cardiology Office Note:    Date:  05/13/2019   ID:  GARHETT [place], DOB July 16, 1945, MRN [id number]  PCP:  System, Pcp Not In  Cardiologist:  [name], MD  Electrophysiologist:  None   Referring MD: [name], MD   No chief complaint on file.   History of Present Illness:    [place] is a 74 y.o. male with a hx of CAD s/p CABG, HTN, HLD, DM II, syncope and prostate cancer.  He had a history of inferior MI in February 2004 while in [place], this was treated with drug-eluting stent to the RCA.  He had abnormal Myoview in 2005 and underwent repeat cardiac catheterization, this showed widely patent stent and no other significant disease.  Stress test in July 2014 showed fixed inferior wall scar with mild LV dysfunction.  Due to abnormal Myoview, he underwent repeat cardiac catheterization on 11/06/2015 in [facility], this revealed patent RCA stent, occluded LAD and a 70% disease in the OM1 and OM 2.  He eventually underwent CABG x1 with LIMA to LAD by Dr. [name] in [facility] in October 2017.  Postop echocardiogram shows EF of 40% in October 2017.  Carotid Doppler showed no obstructive disease.  Cardiac catheterization at the time showed very tortuous innominate artery.  Patient was last seen by Dr. Martinique on 03/17/2019 at which time he reported episode of syncope.  Echocardiogram obtained on 03/22/2019 showed EF 45%, moderate LVH, mildly elevated pulmonary artery systolic pressure, mild MR.  Event monitor was recommended to rule out significant arrhythmia.  Patient presents today along with his wife.  Recent heart monitor preliminary summary was reviewed, the summary included 2 episode of nonsustained VT, first episode was 5 beats run and a second episode was 7 beats run.  He is already on the highest dose of carvedilol.  We discussed the possibility of uptitrating taking carvedilol a little bit more to 37.5 mg twice daily, however patient was not very  enthusiastic.  He has not had any further presyncope or syncope.  He does have frequent dizziness which he attributed to vertigo.  I will check with Dr. Martinique to see if rate control medication need to be uptitrated or any thought about starting amiodarone therapy.  It is unclear to me whether or not he is symptom was truly related to the underlying nonsustained VT as he did not seems to be very symptomatic when the VT happened.  Past Medical History:  Diagnosis Date  . Balanitis   . CAD (coronary artery disease)   . Depression   . Diabetes mellitus type II   . ED (erectile dysfunction)   . HTN (hypertension)   . Hyperlipidemia   . Old MI (myocardial infarction) 12/07/2013  . Prostate cancer [facility])     Past Surgical History:  Procedure Laterality Date  . CORONARY ARTERY BYPASS GRAFT  10/2015  . PROSTATE BIOPSY    . UMBILICAL HERNIA REPAIR      Current Medications: Current Meds  Medication Sig  . aspirin 81 MG tablet Take 81 mg by mouth daily.  [name] atorvastatin (LIPITOR) 40 MG tablet Take 1 tablet (40 mg total) by mouth daily.  [name] b complex vitamins tablet Take 1 tablet by mouth daily.  . Blood Glucose Monitoring Suppl (ONETOUCH VERIO IQ SYSTEM) w/Device KIT USE TO CHECK BLOOD SUGAR  DAILY. DX E11.9  . carvedilol (COREG) 25 MG tablet Take 1 tablet (25 mg total) by mouth 2 (two) times daily.  [name]  Cholecalciferol (VITAMIN D3) 50 MCG (2000 UT) capsule Take 1 capsule (2,000 Units total) by mouth daily.  . clotrimazole-betamethasone (LOTRISONE) cream Apply topically 2 (two) times daily.  . DULoxetine (CYMBALTA) 30 MG capsule Take 1 capsule (30 mg total) by mouth daily.  [name] gabapentin (NEURONTIN) 300 MG capsule Take 1 capsule (300 mg total) by mouth at bedtime.  [name] glipiZIDE (GLUCOTROL XL) 2.5 MG 24 hr tablet Take 1 tablet (2.5 mg total) by mouth daily with breakfast.  . glucose blood (ONETOUCH VERIO) test strip 1 each by Other route daily. Use to check blood sugars twice a day  . losartan  (COZAAR) 50 MG tablet Take 1 tablet (50 mg total) by mouth daily.  . meclizine (ANTIVERT) 25 MG tablet TAKE 1 TABLET BY MOUTH THREE TIMES DAILY AS NEEDED FOR  DIZZINESS  . metFORMIN (GLUCOPHAGE) 1000 MG tablet Take 1 tablet (1,000 mg total) by mouth 2 (two) times daily with a meal.  . ONETOUCH DELICA LANCETS FINE MISC Use to help check blood sugars twice a day. Dx E11.9  . SITagliptin-metFORMIN HCl (JANUMET PO) Take by mouth.  . tamsulosin (FLOMAX) 0.4 MG CAPS capsule Take 1 capsule (0.4 mg total) by mouth daily after supper.   Current Facility-Administered Medications for the 05/11/19 encounter (Office Visit) with [name], PA  Medication  . aspirin chewable tablet 81 mg     Allergies:   Jardiance [empagliflozin]   Social History   Socioeconomic History  . Marital status: Married    Spouse name: Not on file  . Number of children: 1  . Years of education: Not on file  . Highest education level: Not on file  Occupational History  . Occupation: truck driver-self employed  Tobacco Use  . Smoking status: Never Smoker  . Smokeless tobacco: Never Used  Substance and Sexual Activity  . Alcohol use: No    Alcohol/week: 0.0 standard drinks  . Drug use: No  . Sexual activity: Yes  Other Topics Concern  . Not on file  Social History Narrative  . Not on file   Social Determinants of Health   Financial Resource Strain:   . Difficulty of Paying Living Expenses:   Food Insecurity:   . Worried About Charity fundraiser in the Last Year:   . Arboriculturist in the Last Year:   Transportation Needs:   . Film/video editor (Medical):   [name] Lack of Transportation (Non-Medical):   Physical Activity:   . Days of Exercise per Week:   . Minutes of Exercise per Session:   Stress:   . Feeling of Stress :   Social Connections:   . Frequency of Communication with Friends and Family:   . Frequency of Social Gatherings with Friends and Family:   . Attends Religious Services:   . Active  Member of Clubs or Organizations:   . Attends Archivist Meetings:   [name] Marital Status:      Family History: The patient's family history includes Cancer in his father; Diabetes in his mother and another family member; Hyperlipidemia in his mother; Hypertension in his mother.  ROS:   Please see the history of present illness.     All other systems reviewed and are negative.  EKGs/Labs/Other Studies Reviewed:    The following studies were reviewed today:  Echo 03/22/2019 1. Left ventricular ejection fraction, by estimation, is 45%. The left  ventricle has mildly decreased function. The left ventrical has regional  wall motion abnormalities. There  is moderately increased concentric left  ventricular hypertrophy.  2. Right ventricular systolic function is mildly reduced. The right  ventricular size is normal. There is mildly elevated pulmonary artery  systolic pressure.  3. The mitral valve is normal in structure and function. Mild mitral  valve regurgitation. No evidence of mitral stenosis.   EKG:  EKG is ordered today.  The ekg ordered today demonstrates normal sinus rhythm without significant ST-T wave changes  Recent Labs: 03/17/2019: ALT 20; BUN 13; Creatinine, Ser 0.99; Hemoglobin 13.3; Magnesium 2.1; Platelets 227; Potassium 4.8; Sodium 135  Recent Lipid Panel    Component Value Date/Time   CHOL 138 03/17/2019 1418   TRIG 150 (H) 03/17/2019 1418   HDL 50 03/17/2019 1418   CHOLHDL 2.8 03/17/2019 1418   CHOLHDL 4 04/02/2017 0955   VLDL 42.0 (H) 04/02/2017 0955   LDLCALC 62 03/17/2019 1418   LDLDIRECT 106.0 05/14/2017 1025    Physical Exam:    VS:  BP 120/60   Pulse 80   Temp (!) 96.3 F (35.7 C)   Ht 6' (1.829 m)   Wt 246 lb 6.4 oz (111.8 kg)   SpO2 97%   BMI 33.42 kg/m     Wt Readings from Last 3 Encounters:  05/11/19 246 lb 6.4 oz (111.8 kg)  03/17/19 243 lb 8 oz (110.5 kg)  09/29/18 245 lb 12.8 oz (111.5 kg)     GEN:  Well nourished, well  developed in no acute distress HEENT: Normal NECK: No JVD; No carotid bruits LYMPHATICS: No lymphadenopathy CARDIAC: RRR, no murmurs, rubs, gallops RESPIRATORY:  Clear to auscultation without rales, wheezing or rhonchi  ABDOMEN: Soft, non-tender, non-distended MUSCULOSKELETAL:  No edema; No deformity  SKIN: Warm and dry NEUROLOGIC:  Alert and oriented x 3 PSYCHIATRIC:  Normal affect   ASSESSMENT:    1. Syncope and collapse   2. Coronary artery disease involving coronary bypass graft of native heart without angina pectoris   3. Leg pain, bilateral   4. Essential hypertension   5. Hyperlipidemia LDL goal <70   6. Controlled type 2 diabetes mellitus without complication, without long-term current use of insulin (HCC)    PLAN:    In order of problems listed above:  1. Syncope: Recent heart monitor showed 2 episode of nonsustained VT, first episode lasted 5 beats, second episode lasted 7 beats.  He is already on the maximum dose of carvedilol.  I am unclear if this is enough to generate any syncope.  His symptom symptoms still have occurred after he was trying to reach up to the cup board to grab something then fell backward.  I will check with Dr. Martinique to see if he would recommend to continue uptitrated carvedilol to 37.5 mg twice daily or adding any antiarrhythmic therapy.  The patient does not seem to be very enthusiastic about either possibility.  Fortunately symptom has not recurred since  2. CAD s/p CABG: Denies any recent chest pain  3. Bilateral leg pain: Lower extremity pulses fairly weak.  We will proceed with ABI  4. Hypertension: Blood pressure stable  5. Hyperlipidemia: Continue statin therapy  6. DM2: Managed by primary care provider   Medication Adjustments/Labs and Tests Ordered: Current medicines are reviewed at length with the patient today.  Concerns regarding medicines are outlined above.  Orders Placed This Encounter  Procedures  . EKG 12-Lead  . VAS [place]  LOWER EXTREMITY ARTERIAL DUPLEX  . VAS [place] LE ART SEG MULTI (Segm&LE Reynauds)   No  orders of the defined types were placed in this encounter.   Patient Instructions  Medication Instructions:  Your physician recommends that you continue on your current medications as directed. Please refer to the Current Medication list given to you today.  *If you need a refill on your cardiac medications before your next appointment, please call your pharmacy*  Lab Work: NONE ordered at this time of appointment   If you have labs (blood work) drawn today and your tests are completely normal, you will receive your results only by: [name] MyChart Message (if you have MyChart) OR . A paper copy in the mail If you have any lab test that is abnormal or we need to change your treatment, we will call you to review the results.  Testing/Procedures: Your physician has requested that you have an ankle brachial index (ABI). During this test an ultrasound and blood pressure cuff are used to evaluate the arteries that supply the arms and legs with blood. Allow thirty minutes for this exam. There are no restrictions or special instructions. This will be done at [place] 2-3 [place] has requested that you have a lower extremity arterial exercise duplex. During this test, exercise and ultrasound are used to evaluate arterial blood flow in the legs. Allow one hour for this exam. There are no restrictions or special instructions. This will be done at [place] 2-3 WEEKS  Follow-Up: At [facility], you and your health needs are our priority.  As part of our continuing mission to provide you with exceptional heart care, we have created designated Provider Care Teams.  These Care Teams include your primary Cardiologist (physician) and Advanced Practice Providers (APPs -  Physician Assistants and Nurse Practitioners) who all work  together to provide you with the care you need, when you need it.  We recommend signing up for the patient portal called "MyChart".  Sign up information is provided on this After Visit Summary.  MyChart is used to connect with patients for Virtual Visits (Telemedicine).  Patients are able to view lab/test results, encounter notes, upcoming appointments, etc.  Non-urgent messages can be sent to your provider as well.   To learn more about what you can do with MyChart, go to [email or web address].    Your next appointment:   3 month(s)  The format for your next appointment:   In Person  Provider:   [name], MD  Other Instructions      Signed, [name], [place]  05/13/2019 11:58 PM    [place]

## 2019-05-24 ENCOUNTER — Inpatient Hospital Stay (HOSPITAL_COMMUNITY): Admission: RE | Admit: 2019-05-24 | Payer: Medicare Other | Source: Ambulatory Visit

## 2019-06-05 ENCOUNTER — Other Ambulatory Visit: Payer: Self-pay | Admitting: Physician Assistant

## 2019-06-05 DIAGNOSIS — I700 Atherosclerosis of aorta: Secondary | ICD-10-CM

## 2019-06-05 DIAGNOSIS — R0989 Other specified symptoms and signs involving the circulatory and respiratory systems: Secondary | ICD-10-CM

## 2019-06-07 ENCOUNTER — Other Ambulatory Visit: Payer: Self-pay

## 2019-06-07 ENCOUNTER — Ambulatory Visit (HOSPITAL_COMMUNITY)
Admission: RE | Admit: 2019-06-07 | Discharge: 2019-06-07 | Disposition: A | Discharge status: Home / Self Care | Payer: Medicare Other | Source: Ambulatory Visit | Attending: Internal Medicine | Admitting: Internal Medicine

## 2019-06-07 DIAGNOSIS — R0989 Other specified symptoms and signs involving the circulatory and respiratory systems: Secondary | ICD-10-CM | POA: Insufficient documentation

## 2019-06-07 DIAGNOSIS — M79604 Pain in right leg: Secondary | ICD-10-CM | POA: Diagnosis present

## 2019-06-07 DIAGNOSIS — M79605 Pain in left leg: Secondary | ICD-10-CM | POA: Diagnosis present

## 2019-06-07 DIAGNOSIS — I700 Atherosclerosis of aorta: Secondary | ICD-10-CM | POA: Insufficient documentation

## 2019-06-14 NOTE — Progress Notes (Signed)
Mild bilateral lower extremity disease, worse in the right leg. If leg pain persistent, recommend refer to Dr. Gwenlyn Found

## 2019-06-22 ENCOUNTER — Telehealth: Payer: Self-pay | Admitting: Physician Assistant

## 2019-06-22 NOTE — Telephone Encounter (Signed)
    I went in pt's chart to see who called him today 

## 2019-08-16 ENCOUNTER — Other Ambulatory Visit: Payer: Self-pay | Admitting: Internal Medicine

## 2019-08-30 NOTE — Telephone Encounter (Signed)
Close this encounter

## 2019-09-08 ENCOUNTER — Other Ambulatory Visit: Payer: Self-pay | Admitting: Physician Assistant

## 2019-10-10 NOTE — Progress Notes (Deleted)
Cardiology Office Note:    Date:  10/10/2019   ID:  CABOT [place], DOB 12-29-1945, MRN [id number]  PCP:  System, Pcp Not In  Cardiologist:  Jahan Friedlander Martinique, MD  Electrophysiologist:  None   Referring MD: No ref. provider found   No chief complaint on file.   History of Present Illness:    [place] is a 74 y.o. male with a hx of CAD s/p CABG, HTN, HLD, DM II, syncope and prostate cancer.  He had a history of inferior MI in February 2004 while in [place], this was treated with drug-eluting stent to the RCA.  He had abnormal Myoview in 2005 and underwent repeat cardiac catheterization, this showed widely patent stent and no other significant disease.  Stress test in July 2014 showed fixed inferior wall scar with mild LV dysfunction.  Due to abnormal Myoview, he underwent repeat cardiac catheterization on 11/06/2015 in [place], this revealed patent RCA stent, occluded LAD and a 70% disease in the OM1 and OM 2.  He eventually underwent CABG x1 with LIMA to LAD by Dr. [name] in [place] in October 2017.  Postop echocardiogram shows EF of 40% in October 2017.  Carotid Doppler showed no obstructive disease.  Cardiac catheterization at the time showed very tortuous innominate artery.  Patient was last seen by me on 03/17/2019 at which time he reported episode of syncope.  Echocardiogram obtained on 03/22/2019 showed EF 45%, moderate LVH, mildly elevated pulmonary artery systolic pressure, mild MR.  Event monitor was performed and showed 2 brief runs of NSVT of 5 and 7 beats that were asymptomatic. He was  Continued on current beta blocker dose.    Past Medical History:  Diagnosis Date   Balanitis    CAD (coronary artery disease)    Depression    Diabetes mellitus type II    ED (erectile dysfunction)    HTN (hypertension)    Hyperlipidemia    Old MI (myocardial infarction) 12/07/2013   Prostate cancer [facility])     Past Surgical History:  Procedure Laterality  Date   CORONARY ARTERY BYPASS GRAFT  10/2015   PROSTATE BIOPSY     UMBILICAL HERNIA REPAIR      Current Medications: No outpatient medications have been marked as taking for the 10/12/19 encounter (Appointment) with Martinique, Martese Vanatta M, MD.   Current Facility-Administered Medications for the 10/12/19 encounter (Appointment) with Martinique, Manar Smalling M, MD  Medication   aspirin chewable tablet 81 mg     Allergies:   Jardiance [empagliflozin]   Social History   Socioeconomic History   Marital status: Married    Spouse name: Not on file   Number of children: 1   Years of education: Not on file   Highest education level: Not on file  Occupational History   Occupation: truck driver-self employed  Tobacco Use   Smoking status: Never Smoker   Smokeless tobacco: Never Used  Substance and Sexual Activity   Alcohol use: No    Alcohol/week: 0.0 standard drinks   Drug use: No   Sexual activity: Yes  Other Topics Concern   Not on file  Social History Narrative   Not on file   Social Determinants of Health   Financial Resource Strain:    Difficulty of Paying Living Expenses: Not on file  Food Insecurity:    Worried About [place] in the Last Year: Not on file   [employer] of Food in the Last Year: Not  on file  Transportation Needs:    Lack of Transportation (Medical): Not on file   Lack of Transportation (Non-Medical): Not on file  Physical Activity:    Days of Exercise per Week: Not on file   Minutes of Exercise per Session: Not on file  Stress:    Feeling of Stress : Not on file  Social Connections:    Frequency of Communication with Friends and Family: Not on file   Frequency of Social Gatherings with Friends and Family: Not on file   Attends Religious Services: Not on file   Active Member of Clubs or Organizations: Not on file   Attends Archivist Meetings: Not on file   Marital Status: Not on file     Family History: The  patient's family history includes Cancer in his father; Diabetes in his mother and another family member; Hyperlipidemia in his mother; Hypertension in his mother.  ROS:   Please see the history of present illness.     All other systems reviewed and are negative.  EKGs/Labs/Other Studies Reviewed:    The following studies were reviewed today:  Echo 03/22/2019 1. Left ventricular ejection fraction, by estimation, is 45%. The left  ventricle has mildly decreased function. The left ventrical has regional  wall motion abnormalities. There is moderately increased concentric left  ventricular hypertrophy.  2. Right ventricular systolic function is mildly reduced. The right  ventricular size is normal. There is mildly elevated pulmonary artery  systolic pressure.  3. The mitral valve is normal in structure and function. Mild mitral  valve regurgitation. No evidence of mitral stenosis.   EKG:  EKG is ordered today.  The ekg ordered today demonstrates normal sinus rhythm without significant ST-T wave changes  Recent Labs: 03/17/2019: ALT 20; BUN 13; Creatinine, Ser 0.99; Hemoglobin 13.3; Magnesium 2.1; Platelets 227; Potassium 4.8; Sodium 135  Recent Lipid Panel    Component Value Date/Time   CHOL 138 03/17/2019 1418   TRIG 150 (H) 03/17/2019 1418   HDL 50 03/17/2019 1418   CHOLHDL 2.8 03/17/2019 1418   CHOLHDL 4 04/02/2017 0955   VLDL 42.0 (H) 04/02/2017 0955   LDLCALC 62 03/17/2019 1418   LDLDIRECT 106.0 05/14/2017 1025   Dated 06/28/19: cholesterol 118, triglycerides 90, HDL 48, LDL 106. Creatinine 1.16. otherwise CMET normal.  Physical Exam:    VS:  There were no vitals taken for this visit.    Wt Readings from Last 3 Encounters:  05/11/19 246 lb 6.4 oz (111.8 kg)  03/17/19 243 lb 8 oz (110.5 kg)  09/29/18 245 lb 12.8 oz (111.5 kg)     GEN:  Well nourished, well developed in no acute distress HEENT: Normal NECK: No JVD; No carotid bruits LYMPHATICS: No  lymphadenopathy CARDIAC: RRR, no murmurs, rubs, gallops RESPIRATORY:  Clear to auscultation without rales, wheezing or rhonchi  ABDOMEN: Soft, non-tender, non-distended MUSCULOSKELETAL:  No edema; No deformity  SKIN: Warm and dry NEUROLOGIC:  Alert and oriented x 3 PSYCHIATRIC:  Normal affect   ASSESSMENT:    No diagnosis found. PLAN:    In order of problems listed above:  1. Syncope: Work up unrevealing. Doubt brief NSVT as cause.   2. CAD s/p CABG: Denies any recent chest pain  3. Bilateral leg pain: Lower extremity pulses fairly weak.  We will proceed with ABI  4. Hypertension: Blood pressure stable  5. Hyperlipidemia: Continue statin therapy  6. DM2: Managed by primary care provider   Medication Adjustments/Labs and Tests Ordered: Current medicines  are reviewed at length with the patient today.  Concerns regarding medicines are outlined above.  No orders of the defined types were placed in this encounter.  No orders of the defined types were placed in this encounter.   There are no Patient Instructions on file for this visit.   Signed, Othel Hoogendoorn Martinique, MD  10/10/2019 4:15 PM    [facility]

## 2019-10-12 ENCOUNTER — Ambulatory Visit: Payer: Medicare Other | Admitting: Cardiology

## 2020-01-05 NOTE — Progress Notes (Signed)
Cardiology Office Note    Date:  01/10/2020   ID:  Clinton Kirby, Clinton Kirby 19-Feb-1945, MRN [id number]  PCP:  Vincenza Hews, NP  Cardiologist:  Tomasina Keasling Martinique, MD    History of Present Illness:  Clinton Kirby is a 74 y.o. male who is seen for evaluation of syncope. He has a known history of coronary disease and  poorly controlled diabetes mellitus type 2 as well as hyperlipidemia, hypertension and peripheral neuropathy.  He is status post inferior myocardial infarction in February of 2004 while in [place]. This was treated with a drug-eluting stent to the right coronary. In 2005 he had some abnormality on a stress test and underwent repeat cardiac catheterization. This showed the stent to be widely patent and he had no other significant disease. Stress test here was in July 2014 showed a fixed inferior wall scar and mild LV dysfunction.   He is  s/p CABG in [facility] on 11/11/15. H was admitted  with chest pain. He ruled out for MI.  He  had an abnormal Myoview showing inferior and apical scar with ischemia in the anterior wall and apex. EF 28%.  He underwent cardiac cath 11/06/15. His cath showed a patent RCA stent but an occluded LAD and 70% stenosis in the OM1 and OM2.  He underwent CABG x 1 with LIMA-LAD 11/11/15 by Dr [name]. The OMs were felt to be too small for bypass. Post OP echo 11/13/15 showed his EF to be 40%. Carotid dopplers showed no obstructive disease. It was noted on cardiac cath that his right innominate artery was very tortuous.   Cath films reviewed personally by me:  11/07/15. This showed occlusion of the proximal LAD with some collateral. There was a large ramus vessel that bifurcated in the mid vessel. There was 40-50% disease at the bifurcation. The first OM was a moderate sized vessel with 80-90% stenosis. The second OM was smaller with a 70% stenosis. The RCA was patent in the mid vessel at site of prior stent. There was diffuse 40% disease in the proximal  vessel. It is surprising that the OMs were too small for bypass. If he has refractory angina could be considered for PCI of OM1. On a prior  visit losartan was added for LV dysfunction.  He was seen in February with an episode of syncope.  It was in the morning prior to eating. He was reaching up for something in the cabinet when he passed out completely. He wife helped catch him and ease him to the floor. States he was out seconds. No confusion. Mild sweating. No chest pain, dyspnea, incontinence or seizure activity. He had no trauma. BS was 225. He felt fine afterwards and has had no recurrence since then. Echo was done and EF unchanged at 45%. Event monitor showed occasional PVCs with 5 and 7 beat runs of NSVT that were not symptomatic.   When last seen by [name] PA-C pedal pulses were noted to be reduced. Dopplers showed occlusion of right PT an AT vessels and occluded left AT.   On follow up today he is doing well. He walks regularly. Denies any claudication. Notes intermittent lightheadedness but no syncope. Reports last A1c 8.4%. no chest pain or dyspnea.   Past Medical History:  Diagnosis Date  . Balanitis   . CAD (coronary artery disease)   . Depression   . Diabetes mellitus type II   . ED (erectile dysfunction)   . HTN (hypertension)   .  Hyperlipidemia   . Old MI (myocardial infarction) 12/07/2013  . Prostate cancer [facility])     Past Surgical History:  Procedure Laterality Date  . CORONARY ARTERY BYPASS GRAFT  10/2015  . PROSTATE BIOPSY    . UMBILICAL HERNIA REPAIR      Current Medications: Outpatient Medications Prior to Visit  Medication Sig Dispense Refill  . aspirin 81 MG tablet Take 81 mg by mouth daily.    [name] atorvastatin (LIPITOR) 40 MG tablet Take 1 tablet (40 mg total) by mouth daily. 90 tablet 3  . b complex vitamins tablet Take 1 tablet by mouth daily. 100 tablet 3  . Blood Glucose Monitoring Suppl (ONETOUCH VERIO IQ SYSTEM) w/Device KIT USE TO CHECK BLOOD SUGAR   DAILY. DX E11.9 1 kit 0  . Cholecalciferol (VITAMIN D3) 50 MCG (2000 UT) capsule Take 1 capsule (2,000 Units total) by mouth daily. 100 capsule 3  . clotrimazole-betamethasone (LOTRISONE) cream Apply topically 2 (two) times daily. 45 g 1  . DULoxetine (CYMBALTA) 30 MG capsule Take 1 capsule (30 mg total) by mouth daily. 90 capsule 1  . gabapentin (NEURONTIN) 300 MG capsule Take 1 capsule (300 mg total) by mouth at bedtime. (Patient taking differently: Take 300 mg by mouth at bedtime. Pt takes 2 tablets at bedtime) 90 capsule 3  . glipiZIDE (GLUCOTROL XL) 2.5 MG 24 hr tablet Take 1 tablet (2.5 mg total) by mouth daily with breakfast. 90 tablet 3  . glucose blood (ONETOUCH VERIO) test strip 1 each by Other route daily. Use to check blood sugars twice a day 300 each 1  . meclizine (ANTIVERT) 25 MG tablet TAKE 1 TABLET BY MOUTH THREE TIMES DAILY AS NEEDED FOR  DIZZINESS 20 tablet 0  . metFORMIN (GLUCOPHAGE) 1000 MG tablet Take 1 tablet (1,000 mg total) by mouth 2 (two) times daily with a meal. 180 tablet 3  . ONETOUCH DELICA LANCETS FINE MISC Use to help check blood sugars twice a day. Dx E11.9 300 each 1  . SITagliptin-metFORMIN HCl (JANUMET PO) Take by mouth.    . tamsulosin (FLOMAX) 0.4 MG CAPS capsule Take 1 capsule (0.4 mg total) by mouth daily after supper. 90 capsule 3  . carvedilol (COREG) 25 MG tablet Take 1 tablet (25 mg total) by mouth 2 (two) times daily. PATIENT NEEDS OFFICE VISIT FOR FUTURE REFILLS 60 tablet 0  . losartan (COZAAR) 50 MG tablet Take 1 tablet (50 mg total) by mouth daily. 90 tablet 3   Facility-Administered Medications Prior to Visit  Medication Dose Route Frequency Provider Last Rate Last Admin  . aspirin chewable tablet 81 mg  81 mg Oral Once Martinique, Akela Pocius M, MD         Allergies:   Vania Rea [empagliflozin]   Social History   Socioeconomic History  . Marital status: Married    Spouse name: Not on file  . Number of children: 1  . Years of education: Not on file    . Highest education level: Not on file  Occupational History  . Occupation: truck driver-self employed  Tobacco Use  . Smoking status: Never Smoker  . Smokeless tobacco: Never Used  Substance and Sexual Activity  . Alcohol use: No    Alcohol/week: 0.0 standard drinks  . Drug use: No  . Sexual activity: Yes  Other Topics Concern  . Not on file  Social History Narrative  . Not on file   Social Determinants of Health   Financial Resource Strain:   . Difficulty of  Paying Living Expenses: Not on file  Food Insecurity:   . Worried About Charity fundraiser in the Last Year: Not on file  . Ran Out of Food in the Last Year: Not on file  Transportation Needs:   . Lack of Transportation (Medical): Not on file  . Lack of Transportation (Non-Medical): Not on file  Physical Activity:   . Days of Exercise per Week: Not on file  . Minutes of Exercise per Session: Not on file  Stress:   . Feeling of Stress : Not on file  Social Connections:   . Frequency of Communication with Friends and Family: Not on file  . Frequency of Social Gatherings with Friends and Family: Not on file  . Attends Religious Services: Not on file  . Active Member of Clubs or Organizations: Not on file  . Attends Archivist Meetings: Not on file  . Marital Status: Not on file     Family History:  The patient's family history includes Cancer in his father; Diabetes in his mother and another family member; Hyperlipidemia in his mother; Hypertension in his mother.   ROS:   Please see the history of present illness.    ROS All other systems reviewed and are negative.   PHYSICAL EXAM:   VS:  BP 100/60 (BP Location: Left Arm, Patient Position: Sitting)   Pulse 97   Ht 6' (1.829 m)   Wt 242 lb (109.8 kg)   SpO2 99%   BMI 32.82 kg/m    GENERAL:  Well appearing overweight BM in NAD HEENT:  PERRL, EOMI, sclera are clear. Oropharynx is clear. NECK:  No jugular venous distention, carotid upstroke brisk  and symmetric, no bruits, no thyromegaly or adenopathy LUNGS:  Clear to auscultation bilaterally CHEST:  Unremarkable HEART:  RRR,  PMI not displaced or sustained,S1 and S2 within normal limits, no S3, no S4: no clicks, no rubs, no murmurs ABD:  Soft, nontender. BS +, no masses or bruits. No hepatomegaly, no splenomegaly SKIN:  Warm and dry.  No rashes NEURO:  Alert and oriented x 3. Cranial nerves II through XII intact. PSYCH:  Cognitively intact   Wt Readings from Last 3 Encounters:  01/10/20 242 lb (109.8 kg)  05/11/19 246 lb 6.4 oz (111.8 kg)  03/17/19 243 lb 8 oz (110.5 kg)      Studies/Labs Reviewed:   EKG:  EKG is not ordered today.    Recent Labs: 03/17/2019: ALT 20; BUN 13; Creatinine, Ser 0.99; Hemoglobin 13.3; Magnesium 2.1; Platelets 227; Potassium 4.8; Sodium 135  Dated 07/19/17: A1c 8.1%.  Dated 08/05/17 A1c 9.1% Dated 11/26/18: glucose 302 otherwise CMET normal. Dated 02/23/19: A1c 11.6%.  Dated 2.5.21: Normal CBC.  Dated 06/18/19: cholesterol 118, triglycerides 90, HDL 48, LDL 106. Creatinine 1.16. otherwise chemistries are normal.   Lipid Panel    Component Value Date/Time   CHOL 138 03/17/2019 1418   TRIG 150 (H) 03/17/2019 1418   HDL 50 03/17/2019 1418   CHOLHDL 2.8 03/17/2019 1418   CHOLHDL 4 04/02/2017 0955   VLDL 42.0 (H) 04/02/2017 0955   LDLCALC 62 03/17/2019 1418   LDLDIRECT 106.0 05/14/2017 1025    Additional studies/ records that were reviewed today include:  Echo 05/07/16: Study Conclusions  - Left ventricle: Septal apical and inferobasal hypokinesis The   cavity size was normal. Wall thickness was increased in a pattern   of mild LVH. Systolic function was mildly to moderately reduced.   The estimated ejection fraction was  in the range of 40% to 45%.   Wall motion was normal; there were no regional wall motion   abnormalities. Left ventricular diastolic function parameters   were normal. - Atrial septum: No defect or patent foramen ovale  was identified.   Echo 03/22/19: IMPRESSIONS    1. Left ventricular ejection fraction, by estimation, is 45%. The left  ventricle has mildly decreased function. The left ventrical has regional  wall motion abnormalities. There is moderately increased concentric left  ventricular hypertrophy.  2. Right ventricular systolic function is mildly reduced. The right  ventricular size is normal. There is mildly elevated pulmonary artery  systolic pressure.  3. The mitral valve is normal in structure and function. Mild mitral  valve regurgitation. No evidence of mitral stenosis.   Event monitor 05/16/19: Study Highlights   Normal sinus rhythm  Occasional PVCs  2 runs of NSVT- 5 and 7 beats  LE arterial dopplers: 06/05/19: Summary:  Right: Atherosclerosis without significant stenosis of the common femoral,  femoral or popliteal arteries.   Occlusion of the anterior tibial and posterior tibial arteries.   Left: 30-49% stenosis noted in the superficial femoral artery. Total  occlusion noted in the anterior tibial artery.   ASSESSMENT:    1. Syncope and collapse   2. Coronary artery disease involving coronary bypass graft of native heart without angina pectoris   3. Hyperlipidemia LDL goal <70   4. PAD (peripheral artery disease) ([place])   5. Chronic systolic heart failure (HCC)      PLAN:  In order of problems listed above:  1. CAD: Patient is s/p CABG with LIMA to the LAD. Old stent in RCA. Residual disease in the OMs and Ramus. He is asymptomatic. Continue current therapy.  Continue ASA, beta blocker.  2. Chronic systolic CHF. Last EF was  40-45%. He is asymptomatic. Will continue   losartan 50 mg daily. Given lightheadedness and low BP will reduce Coreg to 12.5 mg bid.  3. Dyslipidemia  Goal LDL < 70. On atorvastatin 40 mg. Labs monitored by PCP 4. Prostate CA-s/p RT 5.   DM type 2 with neuropathy. Per endocrinology. 6.   Syncope. No recurrence. Echo showed no change in EF  45%. Event monitor with mild arrhythmia but no clear cause for syncope.  7.   PAD. Occlusion of right AT and PT and left AT. Recommend continued regular walking. Discussed importance of good care of his feet with early notification if he develops any sores or blisters.   Medication Adjustments/Labs and Tests Ordered: Current medicines are reviewed at length with the patient today.  Concerns regarding medicines are outlined above.  Medication changes, Labs and Tests ordered today are listed in the Patient Instructions below. Patient Instructions  Reduce Coreg 12.5 mg twice a day  Continue your other therapy      Signed, Courtnei Ruddell Martinique, MD  01/10/2020 11:41 AM    [facility] [address] [place], [place], [address] [phone]

## 2020-01-10 ENCOUNTER — Ambulatory Visit: Payer: Medicare Other | Admitting: Cardiology

## 2020-01-10 ENCOUNTER — Other Ambulatory Visit: Payer: Self-pay

## 2020-01-10 ENCOUNTER — Encounter: Payer: Self-pay | Admitting: Cardiology

## 2020-01-10 VITALS — BP 100/60 | HR 97 | Ht 72.0 in | Wt 242.0 lb

## 2020-01-10 DIAGNOSIS — R55 Syncope and collapse: Secondary | ICD-10-CM

## 2020-01-10 DIAGNOSIS — I739 Peripheral vascular disease, unspecified: Secondary | ICD-10-CM | POA: Diagnosis not present

## 2020-01-10 DIAGNOSIS — E785 Hyperlipidemia, unspecified: Secondary | ICD-10-CM | POA: Diagnosis not present

## 2020-01-10 DIAGNOSIS — I5022 Chronic systolic (congestive) heart failure: Secondary | ICD-10-CM

## 2020-01-10 DIAGNOSIS — I25810 Atherosclerosis of coronary artery bypass graft(s) without angina pectoris: Secondary | ICD-10-CM | POA: Diagnosis not present

## 2020-01-10 MED ORDER — CARVEDILOL 12.5 MG PO TABS: 12.5000 mg | ORAL_TABLET | Freq: Two times a day (BID) | ORAL | 3 refills | Status: DC

## 2020-01-10 NOTE — Patient Instructions (Signed)
Reduce Coreg 12.5 mg twice a day  Continue your other therapy

## 2020-01-16 ENCOUNTER — Other Ambulatory Visit: Payer: Self-pay | Admitting: Internal Medicine

## 2020-03-28 ENCOUNTER — Telehealth: Payer: Self-pay | Admitting: Cardiology

## 2020-03-28 NOTE — Telephone Encounter (Signed)
Only blood thinner we have on pt's med list is a baby aspirin. Would clarify that he isn't taking any additional blood thinners that we aren't aware of. Taking anticoagulation does not affect patients while flying so he can fly to [facility] safely.

## 2020-03-28 NOTE — Telephone Encounter (Signed)
Clinton Kirby is calling wanting to know if it is okay for him to fly from [place] to [place] due to him being on blood thinners. Please advise.

## 2020-03-28 NOTE — Telephone Encounter (Signed)
Spoke with patient, clarified only anticoagulant / antiplatelet he is taking is Aspirin 81mg . Informed patient it is safe to fly. Patient verbalized understanding.

## 2020-04-30 ENCOUNTER — Telehealth: Payer: Self-pay | Admitting: Cardiology

## 2020-04-30 NOTE — Telephone Encounter (Signed)
*  STAT* If patient is at the pharmacy, call can be transferred to refill team.   1. Which medications need to be refilled? (please list name of each medication and dose if known) carvedilol (COREG) 12.5 MG tablet  2. Which pharmacy/location (including street and city if local pharmacy) is medication to be sent to? [place]  3. Do they need a 30 day or 90 day supply? 90 day supply  Patient states he has been completely out of medication for 1 week.

## 2020-05-01 MED ORDER — CARVEDILOL 12.5 MG PO TABS: 12.5000 mg | ORAL_TABLET | Freq: Two times a day (BID) | ORAL | 3 refills | Status: AC

## 2020-08-05 NOTE — Progress Notes (Signed)
Cardiology Office Note    Date:  08/09/2020   ID:  Clinton, Kirby 1945/04/10, MRN [id number]  PCP:  Vincenza Hews, NP  Cardiologist:  Ziyad Dyar Martin[place], MD    History of Present Illness:  Clinton Kirby is a 75 y.o. male who is seen for evaluation of syncope. He has a known history of coronary disease and  poorly controlled diabetes mellitus type 2 as well as hyperlipidemia, hypertension and peripheral neuropathy.  He is status post inferior myocardial infarction in February of 2004 while in [place]. This was treated with a drug-eluting stent to the right coronary. In 2005 he had some abnormality on a stress test and underwent repeat cardiac catheterization. This showed the stent to be widely patent and he had no other significant disease. Stress test here was in July 2014 showed a fixed inferior wall scar and mild LV dysfunction.   He is  s/p CABG in [facility] on 11/11/15. H was admitted  with chest pain. He ruled out for MI.  He  had an abnormal Myoview showing inferior and apical scar with ischemia in the anterior wall and apex. EF 28%.  He underwent cardiac cath 11/06/15. His cath showed a patent RCA stent but an occluded LAD and 70% stenosis in the OM1 and OM2.  He underwent CABG x 1 with LIMA-LAD 11/11/15 by Dr [name]. The OMs were felt to be too small for bypass. Post OP echo 11/13/15 showed his EF to be 40%. Carotid dopplers showed no obstructive disease. It was noted on cardiac cath that his right innominate artery was very tortuous.   Cath films reviewed personally by me:  11/07/15. This showed occlusion of the proximal LAD with some collateral. There was a large ramus vessel that bifurcated in the mid vessel. There was 40-50% disease at the bifurcation. The first OM was a moderate sized vessel with 80-90% stenosis. The second OM was smaller with a 70% stenosis. The RCA was patent in the mid vessel at site of prior stent. There was diffuse 40% disease in the proximal  vessel. It is surprising that the OMs were too small for bypass. If he has refractory angina could be considered for PCI of OM1. On a prior  visit losartan was added for LV dysfunction.  He was seen in February 2021 with an episode of syncope.  It was in the morning prior to eating. He was reaching up for something in the cabinet when he passed out completely. He wife helped catch him and ease him to the floor. States he was out seconds. No confusion. Mild sweating. No chest pain, dyspnea, incontinence or seizure activity. He had no trauma. BS was 225. He felt fine afterwards and has had no recurrence since then. Echo was done and EF unchanged at 45%. Event monitor showed occasional PVCs with 5 and 7 beat runs of NSVT that were not symptomatic.   When seen by [name] PA-C pedal pulses were noted to be reduced. Dopplers showed occlusion of right PT an AT vessels and occluded left AT.   On follow up today he is doing well. He walks regularly. Denies any claudication. Does have stable neuropathic pain. No blisters or sores.  No recurrent syncope. Reports last A1c 9.4%. no chest pain or dyspnea. Has gained 4 lbs.   Past Medical History:  Diagnosis Date   Balanitis    CAD (coronary artery disease)    Depression    Diabetes mellitus type II  ED (erectile dysfunction)    HTN (hypertension)    Hyperlipidemia    Old MI (myocardial infarction) 12/07/2013   Prostate cancer [facility])     Past Surgical History:  Procedure Laterality Date   CORONARY ARTERY BYPASS GRAFT  10/2015   PROSTATE BIOPSY     UMBILICAL HERNIA REPAIR      Current Medications: Outpatient Medications Prior to Visit  Medication Sig Dispense Refill   aspirin 81 MG tablet Take 81 mg by mouth daily.     atorvastatin (LIPITOR) 40 MG tablet Take 1 tablet (40 mg total) by mouth daily. 90 tablet 3   b complex vitamins tablet Take 1 tablet by mouth daily. 100 tablet 3   Blood Glucose Monitoring Suppl (ONETOUCH VERIO IQ SYSTEM) w/Device  KIT USE TO CHECK BLOOD SUGAR  DAILY. DX E11.9 1 kit 0   carvedilol (COREG) 12.5 MG tablet Take 1 tablet (12.5 mg total) by mouth 2 (two) times daily. 180 tablet 3   Cholecalciferol (VITAMIN D3) 50 MCG (2000 UT) capsule Take 1 capsule (2,000 Units total) by mouth daily. 100 capsule 3   Dulaglutide (TRULICITY) 1.5 HK/7.4QV SOPN Inject 1.5 Units into the skin once a week.     DULoxetine (CYMBALTA) 30 MG capsule Take 1 capsule (30 mg total) by mouth daily. 90 capsule 1   gabapentin (NEURONTIN) 300 MG capsule Take 1 capsule (300 mg total) by mouth at bedtime. (Patient taking differently: Take 300 mg by mouth at bedtime. Pt takes 2 tablets at bedtime) 90 capsule 3   glipiZIDE (GLUCOTROL XL) 2.5 MG 24 hr tablet Take 1 tablet (2.5 mg total) by mouth daily with breakfast. 90 tablet 3   glucose blood (ONETOUCH VERIO) test strip 1 each by Other route daily. Use to check blood sugars twice a day 300 each 1   LANTUS SOLOSTAR 100 UNIT/ML Solostar Pen Inject 15 Units into the skin at bedtime.     meclizine (ANTIVERT) 25 MG tablet TAKE 1 TABLET BY MOUTH THREE TIMES DAILY AS NEEDED FOR  DIZZINESS 20 tablet 0   metFORMIN (GLUCOPHAGE) 1000 MG tablet Take 500 mg by mouth daily with breakfast.     ONETOUCH DELICA LANCETS FINE MISC Use to help check blood sugars twice a day. Dx E11.9 300 each 1   tamsulosin (FLOMAX) 0.4 MG CAPS capsule Take 1 capsule (0.4 mg total) by mouth daily after supper. 90 capsule 3   losartan (COZAAR) 50 MG tablet Take 1 tablet (50 mg total) by mouth daily. 90 tablet 3   metFORMIN (GLUCOPHAGE) 1000 MG tablet Take 1 tablet (1,000 mg total) by mouth 2 (two) times daily with a meal. (Patient taking differently: Take 500 mg by mouth daily with breakfast.) 180 tablet 3   SITagliptin-metFORMIN HCl (JANUMET PO) Take by mouth.     Facility-Administered Medications Prior to Visit  Medication Dose Route Frequency Provider Last Rate Last Admin   aspirin chewable tablet 81 mg  81 mg Oral Once Martinique, Armistead Sult  M, MD         Allergies:   Clinton Kirby [empagliflozin]   Social History   Socioeconomic History   Marital status: Married    Spouse name: Not on file   Number of children: 1   Years of education: Not on file   Highest education level: Not on file  Occupational History   Occupation: truck driver-self employed  Tobacco Use   Smoking status: Never   Smokeless tobacco: Never  Substance and Sexual Activity   Alcohol use: No  Alcohol/week: 0.0 standard drinks   Drug use: No   Sexual activity: Yes  Other Topics Concern   Not on file  Social History Narrative   Not on file   Social Determinants of Health   Financial Resource Strain: Not on file  Food Insecurity: Not on file  Transportation Needs: Not on file  Physical Activity: Not on file  Stress: Not on file  Social Connections: Not on file     Family History:  The patient's family history includes Cancer in his father; Diabetes in his mother and another family member; Hyperlipidemia in his mother; Hypertension in his mother.   ROS:   Please see the history of present illness.    ROS All other systems reviewed and are negative.   PHYSICAL EXAM:   VS:  BP 130/74   Pulse 84   Ht 6' (1.829 m)   Wt 246 lb 12.8 oz (111.9 kg)   BMI 33.47 kg/m    GENERAL:  Well appearing overweight BM in NAD HEENT:  PERRL, EOMI, sclera are clear. Oropharynx is clear. NECK:  No jugular venous distention, carotid upstroke brisk and symmetric, no bruits, no thyromegaly or adenopathy LUNGS:  Clear to auscultation bilaterally CHEST:  Unremarkable HEART:  RRR,  PMI not displaced or sustained,S1 and S2 within normal limits, no S3, no S4: no clicks, no rubs, no murmurs ABD:  Soft, nontender. BS +, no masses or bruits. No hepatomegaly, no splenomegaly SKIN:  Warm and dry.  No rashes NEURO:  Alert and oriented x 3. Cranial nerves II through XII intact. PSYCH:  Cognitively intact   Wt Readings from Last 3 Encounters:  08/09/20 246 lb 12.8 oz  (111.9 kg)  01/10/20 242 lb (109.8 kg)  05/11/19 246 lb 6.4 oz (111.8 kg)      Studies/Labs Reviewed:   EKG:  EKG is  ordered today. NSR rate 84. LAD, old inferior and anterolateral infarcts. LVH. No change. I have personally reviewed and interpreted this study.     Recent Labs: No results found for requested labs within last 8760 hours.  Dated 07/19/17: A1c 8.1%.  Dated 08/05/17 A1c 9.1% Dated 11/26/18: glucose 302 otherwise CMET normal. Dated 02/23/19: A1c 11.6%.  Dated 2.5.21: Normal CBC.  Dated 06/18/19: cholesterol 118, triglycerides 90, HDL 48, LDL 106. Creatinine 1.16. otherwise chemistries are normal.   Lipid Panel    Component Value Date/Time   CHOL 138 03/17/2019 1418   TRIG 150 (H) 03/17/2019 1418   HDL 50 03/17/2019 1418   CHOLHDL 2.8 03/17/2019 1418   CHOLHDL 4 04/02/2017 0955   VLDL 42.0 (H) 04/02/2017 0955   LDLCALC 62 03/17/2019 1418   LDLDIRECT 106.0 05/14/2017 1025    Additional studies/ records that were reviewed today include:  Echo 05/07/16: Study Conclusions   - Left ventricle: Septal apical and inferobasal hypokinesis The   cavity size was normal. Wall thickness was increased in a pattern   of mild LVH. Systolic function was mildly to moderately reduced.   The estimated ejection fraction was in the range of 40% to 45%.   Wall motion was normal; there were no regional wall motion   abnormalities. Left ventricular diastolic function parameters   were normal. - Atrial septum: No defect or patent foramen ovale was identified.    Echo 03/22/19: IMPRESSIONS     1. Left ventricular ejection fraction, by estimation, is 45%. The left  ventricle has mildly decreased function. The left ventrical has regional  wall motion abnormalities. There is moderately  increased concentric left  ventricular hypertrophy.   2. Right ventricular systolic function is mildly reduced. The right  ventricular size is normal. There is mildly elevated pulmonary artery  systolic  pressure.   3. The mitral valve is normal in structure and function. Mild mitral  valve regurgitation. No evidence of mitral stenosis.   Event monitor 05/16/19: Study Highlights  Normal sinus rhythm Occasional PVCs 2 runs of NSVT- 5 and 7 beats  LE arterial dopplers: 06/05/19: Summary:  Right: Atherosclerosis without significant stenosis of the common femoral,  femoral or popliteal arteries.   Occlusion of the anterior tibial and posterior tibial arteries.   Left: 30-49% stenosis noted in the superficial femoral artery. Total  occlusion noted in the anterior tibial artery.   ASSESSMENT:    1. Coronary artery disease involving coronary bypass graft of native heart without angina pectoris   2. PAD (peripheral artery disease) ([place])   3. Chronic systolic heart failure ([place])   4. Hyperlipidemia LDL goal <70   5. Essential hypertension      PLAN:  In order of problems listed above:  CAD: Patient is s/p CABG with LIMA to the LAD. Old stent in RCA. Residual disease in the OMs and Ramus. He is asymptomatic. Continue current therapy.  Continue ASA, beta blocker, statin. Chronic systolic CHF. Last EF was  40-45%. He is asymptomatic. Will continue  losartan and Coreg. Prior attempts to increase dose resulted in lightheadedness.  Dyslipidemia  Goal LDL < 70. On atorvastatin 40 mg. Labs monitored by PCP. Looks like he is due to have this repeated. He will make sure this gets done. Prostate CA-s/p RT 5.   DM type 2 with neuropathy. Per endocrinology. Now on higher Trulicity dose.  6.   Syncope. No recurrence. Echo showed no change in EF 45%. Event monitor with mild arrhythmia but no clear cause for syncope.  7.   PAD. Occlusion of right AT and PT and left AT. Recommend continued regular walking. Discussed importance of good care of his feet with early notification if he develops any sores or blisters. No increase in claudication  Medication Adjustments/Labs and Tests Ordered: Current medicines  are reviewed at length with the patient today.  Concerns regarding medicines are outlined above.  Medication changes, Labs and Tests ordered today are listed in the Patient Instructions below. There are no Patient Instructions on file for this visit.    Signed, Hillarie Harrigan Martinique, MD  08/09/2020 3:23 PM    [facility] [address] [place], [place], [address] [phone]

## 2020-08-09 ENCOUNTER — Other Ambulatory Visit: Payer: Self-pay

## 2020-08-09 ENCOUNTER — Encounter: Payer: Self-pay | Admitting: Cardiology

## 2020-08-09 ENCOUNTER — Ambulatory Visit (INDEPENDENT_AMBULATORY_CARE_PROVIDER_SITE_OTHER): Payer: Medicare Other | Admitting: Cardiology

## 2020-08-09 VITALS — BP 130/74 | HR 84 | Ht 72.0 in | Wt 246.8 lb

## 2020-08-09 DIAGNOSIS — E785 Hyperlipidemia, unspecified: Secondary | ICD-10-CM | POA: Diagnosis not present

## 2020-08-09 DIAGNOSIS — I739 Peripheral vascular disease, unspecified: Secondary | ICD-10-CM

## 2020-08-09 DIAGNOSIS — I10 Essential (primary) hypertension: Secondary | ICD-10-CM

## 2020-08-09 DIAGNOSIS — I25810 Atherosclerosis of coronary artery bypass graft(s) without angina pectoris: Secondary | ICD-10-CM | POA: Diagnosis not present

## 2020-08-09 DIAGNOSIS — I5022 Chronic systolic (congestive) heart failure: Secondary | ICD-10-CM | POA: Diagnosis not present

## 2021-05-26 ENCOUNTER — Ambulatory Visit: Payer: Medicare Other | Admitting: Physician Assistant

## 2021-12-10 DEATH — deceased
